# Patient Record
Sex: Female | Born: 1950 | Race: Black or African American | Hispanic: No | Marital: Single | Smoking: Current every day smoker
Health system: Southern US, Community
[De-identification: ages and names within clinical notes are randomized; demographics above are authoritative.]

## PROBLEM LIST (undated history)

## (undated) DIAGNOSIS — E119 Type 2 diabetes mellitus without complications: Secondary | ICD-10-CM

## (undated) DIAGNOSIS — R928 Other abnormal and inconclusive findings on diagnostic imaging of breast: Secondary | ICD-10-CM

## (undated) DIAGNOSIS — F32A Depression, unspecified: Secondary | ICD-10-CM

## (undated) DIAGNOSIS — M5020 Other cervical disc displacement, unspecified cervical region: Secondary | ICD-10-CM

## (undated) DIAGNOSIS — N611 Abscess of the breast and nipple: Secondary | ICD-10-CM

## (undated) DIAGNOSIS — R42 Dizziness and giddiness: Secondary | ICD-10-CM

## (undated) DIAGNOSIS — M48061 Spinal stenosis, lumbar region without neurogenic claudication: Secondary | ICD-10-CM

## (undated) DIAGNOSIS — F819 Developmental disorder of scholastic skills, unspecified: Secondary | ICD-10-CM

## (undated) DIAGNOSIS — F419 Anxiety disorder, unspecified: Secondary | ICD-10-CM

## (undated) DIAGNOSIS — F329 Major depressive disorder, single episode, unspecified: Secondary | ICD-10-CM

## (undated) DIAGNOSIS — M4800 Spinal stenosis, site unspecified: Secondary | ICD-10-CM

## (undated) HISTORY — DX: Dizziness and giddiness: R42

## (undated) HISTORY — PX: BREAST CYST ASPIRATION: SHX578

## (undated) HISTORY — DX: Other abnormal and inconclusive findings on diagnostic imaging of breast: R92.8

## (undated) HISTORY — DX: Abscess of the breast and nipple: N61.1

## (undated) HISTORY — DX: Spinal stenosis, lumbar region without neurogenic claudication: M48.061

## (undated) HISTORY — DX: Major depressive disorder, single episode, unspecified: F32.9

## (undated) HISTORY — DX: Developmental disorder of scholastic skills, unspecified: F81.9

## (undated) HISTORY — DX: Spinal stenosis, site unspecified: M48.00

## (undated) HISTORY — PX: MYOMECTOMY: SHX85

## (undated) HISTORY — DX: Depression, unspecified: F32.A

## (undated) HISTORY — PX: ABDOMINAL HYSTERECTOMY: SHX81

## (undated) HISTORY — DX: Anxiety disorder, unspecified: F41.9

## (undated) HISTORY — DX: Other cervical disc displacement, unspecified cervical region: M50.20

---

## 1978-12-24 DIAGNOSIS — N611 Abscess of the breast and nipple: Secondary | ICD-10-CM

## 1978-12-24 HISTORY — DX: Abscess of the breast and nipple: N61.1

## 1997-09-28 ENCOUNTER — Encounter: Admission: RE | Admit: 1997-09-28 | Discharge: 1997-09-28 | Payer: Self-pay | Admitting: Sports Medicine

## 1997-09-30 ENCOUNTER — Other Ambulatory Visit: Admission: RE | Admit: 1997-09-30 | Discharge: 1997-09-30 | Payer: Self-pay | Admitting: Obstetrics & Gynecology

## 1997-10-16 ENCOUNTER — Emergency Department (HOSPITAL_COMMUNITY): Admission: EM | Admit: 1997-10-16 | Discharge: 1997-10-16 | Payer: Self-pay | Admitting: Emergency Medicine

## 1998-09-21 ENCOUNTER — Emergency Department (HOSPITAL_COMMUNITY): Admission: EM | Admit: 1998-09-21 | Discharge: 1998-09-21 | Payer: Self-pay | Admitting: Emergency Medicine

## 1998-12-24 ENCOUNTER — Encounter (INDEPENDENT_AMBULATORY_CARE_PROVIDER_SITE_OTHER): Payer: Self-pay | Admitting: *Deleted

## 1998-12-24 LAB — CONVERTED CEMR LAB

## 1998-12-26 ENCOUNTER — Encounter: Admission: RE | Admit: 1998-12-26 | Discharge: 1998-12-26 | Payer: Self-pay | Admitting: Family Medicine

## 1999-01-06 ENCOUNTER — Encounter (INDEPENDENT_AMBULATORY_CARE_PROVIDER_SITE_OTHER): Payer: Self-pay

## 1999-01-06 ENCOUNTER — Other Ambulatory Visit: Admission: RE | Admit: 1999-01-06 | Discharge: 1999-01-06 | Payer: Self-pay | Admitting: Obstetrics and Gynecology

## 1999-02-24 ENCOUNTER — Inpatient Hospital Stay (HOSPITAL_COMMUNITY): Admission: RE | Admit: 1999-02-24 | Discharge: 1999-02-26 | Payer: Self-pay | Admitting: Obstetrics and Gynecology

## 1999-02-24 ENCOUNTER — Encounter (INDEPENDENT_AMBULATORY_CARE_PROVIDER_SITE_OTHER): Payer: Self-pay | Admitting: Specialist

## 1999-05-26 DIAGNOSIS — R928 Other abnormal and inconclusive findings on diagnostic imaging of breast: Secondary | ICD-10-CM

## 1999-05-26 HISTORY — DX: Other abnormal and inconclusive findings on diagnostic imaging of breast: R92.8

## 1999-06-20 ENCOUNTER — Encounter: Payer: Self-pay | Admitting: Family Medicine

## 1999-06-20 ENCOUNTER — Encounter: Admission: RE | Admit: 1999-06-20 | Discharge: 1999-06-20 | Payer: Self-pay | Admitting: Family Medicine

## 1999-06-23 ENCOUNTER — Encounter: Admission: RE | Admit: 1999-06-23 | Discharge: 1999-06-23 | Payer: Self-pay | Admitting: Family Medicine

## 1999-06-23 ENCOUNTER — Encounter: Payer: Self-pay | Admitting: Family Medicine

## 1999-07-04 ENCOUNTER — Encounter: Admission: RE | Admit: 1999-07-04 | Discharge: 1999-07-04 | Payer: Self-pay | Admitting: Family Medicine

## 1999-10-01 ENCOUNTER — Emergency Department (HOSPITAL_COMMUNITY): Admission: EM | Admit: 1999-10-01 | Discharge: 1999-10-02 | Payer: Self-pay | Admitting: Emergency Medicine

## 1999-10-02 ENCOUNTER — Emergency Department (HOSPITAL_COMMUNITY): Admission: EM | Admit: 1999-10-02 | Discharge: 1999-10-02 | Payer: Self-pay | Admitting: Emergency Medicine

## 2000-07-05 ENCOUNTER — Emergency Department (HOSPITAL_COMMUNITY): Admission: EM | Admit: 2000-07-05 | Discharge: 2000-07-05 | Payer: Self-pay | Admitting: Emergency Medicine

## 2000-07-08 ENCOUNTER — Emergency Department (HOSPITAL_COMMUNITY): Admission: EM | Admit: 2000-07-08 | Discharge: 2000-07-08 | Payer: Self-pay | Admitting: Emergency Medicine

## 2000-07-11 ENCOUNTER — Encounter: Admission: RE | Admit: 2000-07-11 | Discharge: 2000-07-11 | Payer: Self-pay | Admitting: Family Medicine

## 2000-07-13 ENCOUNTER — Emergency Department (HOSPITAL_COMMUNITY): Admission: EM | Admit: 2000-07-13 | Discharge: 2000-07-13 | Payer: Self-pay | Admitting: Emergency Medicine

## 2000-08-16 ENCOUNTER — Encounter: Admission: RE | Admit: 2000-08-16 | Discharge: 2000-08-16 | Payer: Self-pay | Admitting: Family Medicine

## 2000-08-16 ENCOUNTER — Encounter: Payer: Self-pay | Admitting: Family Medicine

## 2000-08-22 ENCOUNTER — Encounter: Admission: RE | Admit: 2000-08-22 | Discharge: 2000-08-22 | Payer: Self-pay | Admitting: Family Medicine

## 2000-08-22 ENCOUNTER — Encounter: Payer: Self-pay | Admitting: Family Medicine

## 2000-10-15 ENCOUNTER — Encounter: Admission: RE | Admit: 2000-10-15 | Discharge: 2000-10-15 | Payer: Self-pay | Admitting: Family Medicine

## 2001-01-07 ENCOUNTER — Ambulatory Visit (HOSPITAL_COMMUNITY): Admission: RE | Admit: 2001-01-07 | Discharge: 2001-01-07 | Payer: Self-pay | Admitting: Family Medicine

## 2001-01-07 ENCOUNTER — Encounter: Admission: RE | Admit: 2001-01-07 | Discharge: 2001-01-07 | Payer: Self-pay | Admitting: Family Medicine

## 2001-01-11 ENCOUNTER — Emergency Department (HOSPITAL_COMMUNITY): Admission: EM | Admit: 2001-01-11 | Discharge: 2001-01-11 | Payer: Self-pay | Admitting: Emergency Medicine

## 2001-01-23 ENCOUNTER — Ambulatory Visit (HOSPITAL_COMMUNITY): Admission: RE | Admit: 2001-01-23 | Discharge: 2001-01-23 | Payer: Self-pay | Admitting: Family Medicine

## 2001-04-03 ENCOUNTER — Encounter: Admission: RE | Admit: 2001-04-03 | Discharge: 2001-04-03 | Payer: Self-pay | Admitting: Family Medicine

## 2001-07-25 ENCOUNTER — Encounter: Admission: RE | Admit: 2001-07-25 | Discharge: 2001-07-25 | Payer: Self-pay | Admitting: Family Medicine

## 2001-07-29 ENCOUNTER — Encounter: Admission: RE | Admit: 2001-07-29 | Discharge: 2001-07-29 | Payer: Self-pay | Admitting: Family Medicine

## 2001-08-18 ENCOUNTER — Encounter: Admission: RE | Admit: 2001-08-18 | Discharge: 2001-08-18 | Payer: Self-pay | Admitting: Family Medicine

## 2001-08-18 ENCOUNTER — Encounter: Payer: Self-pay | Admitting: Family Medicine

## 2001-10-06 ENCOUNTER — Emergency Department (HOSPITAL_COMMUNITY): Admission: EM | Admit: 2001-10-06 | Discharge: 2001-10-06 | Payer: Self-pay

## 2002-08-19 ENCOUNTER — Encounter: Payer: Self-pay | Admitting: Family Medicine

## 2002-08-19 ENCOUNTER — Encounter: Admission: RE | Admit: 2002-08-19 | Discharge: 2002-08-19 | Payer: Self-pay | Admitting: Family Medicine

## 2002-11-17 ENCOUNTER — Encounter: Payer: Self-pay | Admitting: Sports Medicine

## 2002-11-17 ENCOUNTER — Encounter: Admission: RE | Admit: 2002-11-17 | Discharge: 2002-11-17 | Payer: Self-pay | Admitting: Sports Medicine

## 2002-11-17 ENCOUNTER — Encounter: Admission: RE | Admit: 2002-11-17 | Discharge: 2002-11-17 | Payer: Self-pay | Admitting: Family Medicine

## 2003-03-02 ENCOUNTER — Encounter: Admission: RE | Admit: 2003-03-02 | Discharge: 2003-03-02 | Payer: Self-pay | Admitting: Sports Medicine

## 2003-03-02 ENCOUNTER — Encounter: Admission: RE | Admit: 2003-03-02 | Discharge: 2003-03-02 | Payer: Self-pay | Admitting: Family Medicine

## 2003-03-02 ENCOUNTER — Encounter: Payer: Self-pay | Admitting: Sports Medicine

## 2003-03-23 ENCOUNTER — Encounter: Admission: RE | Admit: 2003-03-23 | Discharge: 2003-03-23 | Payer: Self-pay | Admitting: Family Medicine

## 2003-04-27 ENCOUNTER — Encounter: Admission: RE | Admit: 2003-04-27 | Discharge: 2003-04-27 | Payer: Self-pay | Admitting: Family Medicine

## 2003-05-24 ENCOUNTER — Emergency Department (HOSPITAL_COMMUNITY): Admission: AD | Admit: 2003-05-24 | Discharge: 2003-05-24 | Payer: Self-pay | Admitting: Family Medicine

## 2003-08-23 ENCOUNTER — Encounter: Admission: RE | Admit: 2003-08-23 | Discharge: 2003-08-23 | Payer: Self-pay | Admitting: Family Medicine

## 2004-08-28 ENCOUNTER — Encounter: Admission: RE | Admit: 2004-08-28 | Discharge: 2004-08-28 | Payer: Self-pay | Admitting: Sports Medicine

## 2004-10-17 ENCOUNTER — Ambulatory Visit: Payer: Self-pay | Admitting: Family Medicine

## 2005-04-26 ENCOUNTER — Ambulatory Visit: Payer: Self-pay | Admitting: Family Medicine

## 2005-07-07 ENCOUNTER — Encounter: Admission: RE | Admit: 2005-07-07 | Discharge: 2005-07-07 | Payer: Self-pay | Admitting: Neurosurgery

## 2005-09-25 ENCOUNTER — Encounter: Admission: RE | Admit: 2005-09-25 | Discharge: 2005-09-25 | Payer: Self-pay | Admitting: Neurosurgery

## 2005-09-29 ENCOUNTER — Ambulatory Visit (HOSPITAL_COMMUNITY): Admission: RE | Admit: 2005-09-29 | Discharge: 2005-09-29 | Payer: Self-pay | Admitting: Emergency Medicine

## 2005-10-11 ENCOUNTER — Encounter: Admission: RE | Admit: 2005-10-11 | Discharge: 2005-10-11 | Payer: Self-pay | Admitting: Hematology & Oncology

## 2005-11-15 ENCOUNTER — Ambulatory Visit: Payer: Self-pay | Admitting: Family Medicine

## 2005-12-28 ENCOUNTER — Ambulatory Visit: Payer: Self-pay | Admitting: Family Medicine

## 2005-12-28 ENCOUNTER — Encounter: Admission: RE | Admit: 2005-12-28 | Discharge: 2005-12-28 | Payer: Self-pay | Admitting: Family Medicine

## 2006-01-01 ENCOUNTER — Encounter: Admission: RE | Admit: 2006-01-01 | Discharge: 2006-01-01 | Payer: Self-pay | Admitting: Sports Medicine

## 2006-03-29 ENCOUNTER — Encounter (INDEPENDENT_AMBULATORY_CARE_PROVIDER_SITE_OTHER): Payer: Self-pay | Admitting: Specialist

## 2006-03-29 ENCOUNTER — Ambulatory Visit (HOSPITAL_COMMUNITY): Admission: RE | Admit: 2006-03-29 | Discharge: 2006-03-29 | Payer: Self-pay | Admitting: Surgery

## 2006-07-23 ENCOUNTER — Ambulatory Visit: Payer: Self-pay | Admitting: Family Medicine

## 2006-08-22 DIAGNOSIS — M545 Low back pain, unspecified: Secondary | ICD-10-CM

## 2006-08-22 DIAGNOSIS — E669 Obesity, unspecified: Secondary | ICD-10-CM

## 2006-08-22 DIAGNOSIS — F17200 Nicotine dependence, unspecified, uncomplicated: Secondary | ICD-10-CM

## 2006-08-22 HISTORY — DX: Low back pain, unspecified: M54.50

## 2006-08-23 ENCOUNTER — Encounter (INDEPENDENT_AMBULATORY_CARE_PROVIDER_SITE_OTHER): Payer: Self-pay | Admitting: *Deleted

## 2006-08-26 ENCOUNTER — Encounter (INDEPENDENT_AMBULATORY_CARE_PROVIDER_SITE_OTHER): Payer: Self-pay | Admitting: Specialist

## 2006-08-26 ENCOUNTER — Ambulatory Visit (HOSPITAL_COMMUNITY): Admission: RE | Admit: 2006-08-26 | Discharge: 2006-08-26 | Payer: Self-pay | Admitting: Unknown Physician Specialty

## 2006-10-11 ENCOUNTER — Ambulatory Visit: Payer: Self-pay | Admitting: Family Medicine

## 2007-01-13 ENCOUNTER — Ambulatory Visit: Payer: Self-pay | Admitting: Family Medicine

## 2007-01-13 ENCOUNTER — Telehealth: Payer: Self-pay | Admitting: *Deleted

## 2007-01-13 DIAGNOSIS — G4700 Insomnia, unspecified: Secondary | ICD-10-CM

## 2007-01-13 DIAGNOSIS — M542 Cervicalgia: Secondary | ICD-10-CM | POA: Insufficient documentation

## 2007-01-13 HISTORY — DX: Insomnia, unspecified: G47.00

## 2007-01-14 ENCOUNTER — Encounter: Admission: RE | Admit: 2007-01-14 | Discharge: 2007-01-14 | Payer: Self-pay | Admitting: Family Medicine

## 2007-01-23 ENCOUNTER — Encounter: Admission: RE | Admit: 2007-01-23 | Discharge: 2007-01-23 | Payer: Self-pay | Admitting: Sports Medicine

## 2007-01-27 ENCOUNTER — Encounter: Payer: Self-pay | Admitting: Family Medicine

## 2007-01-30 LAB — HM COLONOSCOPY

## 2007-04-04 ENCOUNTER — Encounter: Payer: Self-pay | Admitting: Family Medicine

## 2007-04-04 ENCOUNTER — Ambulatory Visit: Payer: Self-pay | Admitting: Family Medicine

## 2007-04-04 LAB — CONVERTED CEMR LAB
Hemoglobin: 14.5 g/dL (ref 12.0–15.0)
RBC: 4.57 M/uL (ref 3.87–5.11)
TSH: 0.791 microintl units/mL (ref 0.350–5.50)
WBC: 9.2 10*3/uL (ref 4.0–10.5)

## 2007-04-08 ENCOUNTER — Telehealth: Payer: Self-pay | Admitting: *Deleted

## 2008-01-26 ENCOUNTER — Encounter: Payer: Self-pay | Admitting: Family Medicine

## 2008-01-26 ENCOUNTER — Encounter: Admission: RE | Admit: 2008-01-26 | Discharge: 2008-01-26 | Payer: Self-pay | Admitting: General Surgery

## 2008-02-02 ENCOUNTER — Encounter: Payer: Self-pay | Admitting: *Deleted

## 2008-02-02 ENCOUNTER — Ambulatory Visit: Payer: Self-pay | Admitting: Family Medicine

## 2008-02-02 ENCOUNTER — Encounter: Payer: Self-pay | Admitting: Family Medicine

## 2008-02-02 LAB — CONVERTED CEMR LAB
HDL: 37 mg/dL
LDL Cholesterol: 127 mg/dL
LDL Cholesterol: 127 mg/dL — ABNORMAL HIGH (ref 0–99)
Total CHOL/HDL Ratio: 5.6
VLDL: 45 mg/dL — ABNORMAL HIGH (ref 0–40)

## 2008-02-03 ENCOUNTER — Encounter: Payer: Self-pay | Admitting: Family Medicine

## 2008-02-16 ENCOUNTER — Encounter: Payer: Self-pay | Admitting: Family Medicine

## 2008-02-22 ENCOUNTER — Encounter: Admission: RE | Admit: 2008-02-22 | Discharge: 2008-02-22 | Payer: Self-pay | Admitting: Neurosurgery

## 2008-02-24 ENCOUNTER — Encounter: Payer: Self-pay | Admitting: Family Medicine

## 2008-04-08 ENCOUNTER — Emergency Department (HOSPITAL_COMMUNITY): Admission: EM | Admit: 2008-04-08 | Discharge: 2008-04-08 | Payer: Self-pay | Admitting: Emergency Medicine

## 2008-06-22 ENCOUNTER — Ambulatory Visit: Payer: Self-pay | Admitting: Family Medicine

## 2008-06-22 DIAGNOSIS — M48061 Spinal stenosis, lumbar region without neurogenic claudication: Secondary | ICD-10-CM

## 2008-06-22 HISTORY — DX: Spinal stenosis, lumbar region without neurogenic claudication: M48.061

## 2008-07-02 ENCOUNTER — Encounter: Admission: RE | Admit: 2008-07-02 | Discharge: 2008-09-30 | Payer: Self-pay | Admitting: Family Medicine

## 2008-07-02 ENCOUNTER — Encounter: Payer: Self-pay | Admitting: Family Medicine

## 2008-08-12 ENCOUNTER — Encounter: Payer: Self-pay | Admitting: Family Medicine

## 2008-10-01 ENCOUNTER — Ambulatory Visit: Payer: Self-pay | Admitting: Family Medicine

## 2008-10-04 ENCOUNTER — Encounter: Payer: Self-pay | Admitting: Family Medicine

## 2008-10-11 ENCOUNTER — Telehealth (INDEPENDENT_AMBULATORY_CARE_PROVIDER_SITE_OTHER): Payer: Self-pay | Admitting: *Deleted

## 2008-11-29 ENCOUNTER — Ambulatory Visit: Payer: Self-pay | Admitting: Family Medicine

## 2008-11-29 ENCOUNTER — Encounter: Payer: Self-pay | Admitting: Family Medicine

## 2008-12-01 ENCOUNTER — Encounter: Payer: Self-pay | Admitting: Family Medicine

## 2008-12-06 ENCOUNTER — Encounter: Payer: Self-pay | Admitting: Family Medicine

## 2008-12-13 ENCOUNTER — Ambulatory Visit: Payer: Self-pay | Admitting: Family Medicine

## 2008-12-13 DIAGNOSIS — E785 Hyperlipidemia, unspecified: Secondary | ICD-10-CM

## 2009-01-04 ENCOUNTER — Emergency Department (HOSPITAL_COMMUNITY): Admission: EM | Admit: 2009-01-04 | Discharge: 2009-01-04 | Payer: Self-pay | Admitting: Family Medicine

## 2009-01-17 ENCOUNTER — Emergency Department (HOSPITAL_COMMUNITY): Admission: EM | Admit: 2009-01-17 | Discharge: 2009-01-17 | Payer: Self-pay | Admitting: Emergency Medicine

## 2009-01-19 ENCOUNTER — Emergency Department (HOSPITAL_COMMUNITY): Admission: EM | Admit: 2009-01-19 | Discharge: 2009-01-19 | Payer: Self-pay | Admitting: Emergency Medicine

## 2009-02-01 ENCOUNTER — Ambulatory Visit: Payer: Self-pay | Admitting: Family Medicine

## 2009-02-23 ENCOUNTER — Encounter: Admission: RE | Admit: 2009-02-23 | Discharge: 2009-02-23 | Payer: Self-pay | Admitting: Family Medicine

## 2009-03-26 ENCOUNTER — Emergency Department (HOSPITAL_COMMUNITY): Admission: EM | Admit: 2009-03-26 | Discharge: 2009-03-26 | Payer: Self-pay | Admitting: Family Medicine

## 2009-04-27 ENCOUNTER — Encounter: Payer: Self-pay | Admitting: Family Medicine

## 2009-04-27 DIAGNOSIS — F418 Other specified anxiety disorders: Secondary | ICD-10-CM

## 2009-05-04 ENCOUNTER — Inpatient Hospital Stay (HOSPITAL_COMMUNITY): Admission: AD | Admit: 2009-05-04 | Discharge: 2009-05-05 | Payer: Self-pay | Admitting: Family Medicine

## 2009-05-04 ENCOUNTER — Ambulatory Visit: Payer: Self-pay | Admitting: Family Medicine

## 2009-05-04 ENCOUNTER — Encounter: Payer: Self-pay | Admitting: Family Medicine

## 2009-05-10 ENCOUNTER — Ambulatory Visit: Payer: Self-pay | Admitting: Family Medicine

## 2009-07-04 ENCOUNTER — Ambulatory Visit: Payer: Self-pay | Admitting: Family Medicine

## 2009-07-04 ENCOUNTER — Encounter: Payer: Self-pay | Admitting: Family Medicine

## 2009-07-05 ENCOUNTER — Encounter: Payer: Self-pay | Admitting: Family Medicine

## 2009-07-05 LAB — CONVERTED CEMR LAB
Alkaline Phosphatase: 51 units/L (ref 39–117)
Cholesterol: 214 mg/dL — ABNORMAL HIGH (ref 0–200)
Glucose, Bld: 136 mg/dL — ABNORMAL HIGH (ref 70–99)
HDL: 44 mg/dL (ref >39)
Potassium: 4.4 meq/L (ref 3.5–5.3)
Total Bilirubin: 1 mg/dL (ref 0.3–1.2)
Total CHOL/HDL Ratio: 4.9
Total Protein: 7.4 g/dL (ref 6.0–8.3)
Triglycerides: 152 mg/dL — ABNORMAL HIGH (ref <150)
VLDL: 30 mg/dL (ref 0–40)

## 2009-08-16 ENCOUNTER — Ambulatory Visit: Payer: Self-pay | Admitting: Family Medicine

## 2009-11-11 ENCOUNTER — Ambulatory Visit: Payer: Self-pay | Admitting: Family Medicine

## 2009-11-11 ENCOUNTER — Encounter: Payer: Self-pay | Admitting: Family Medicine

## 2009-11-11 LAB — CONVERTED CEMR LAB
BUN: 12 mg/dL (ref 6–23)
Chloride: 104 meq/L (ref 96–112)
Direct LDL: 153 mg/dL — ABNORMAL HIGH
Glucose, Bld: 106 mg/dL — ABNORMAL HIGH (ref 70–99)
Potassium: 4 meq/L (ref 3.5–5.3)
Sodium: 139 meq/L (ref 135–145)

## 2009-11-14 ENCOUNTER — Encounter: Payer: Self-pay | Admitting: Family Medicine

## 2009-12-13 ENCOUNTER — Telehealth: Payer: Self-pay | Admitting: Family Medicine

## 2009-12-19 ENCOUNTER — Ambulatory Visit: Payer: Self-pay | Admitting: Family Medicine

## 2010-01-04 ENCOUNTER — Telehealth: Payer: Self-pay | Admitting: Family Medicine

## 2010-01-13 ENCOUNTER — Ambulatory Visit: Payer: Self-pay | Admitting: Family Medicine

## 2010-01-13 ENCOUNTER — Encounter: Payer: Self-pay | Admitting: Family Medicine

## 2010-01-13 LAB — CONVERTED CEMR LAB
ALT: 17 units/L (ref 0–35)
AST: 20 units/L (ref 0–37)
Albumin: 4.2 g/dL (ref 3.5–5.2)
CO2: 24 meq/L (ref 19–32)
Calcium: 9.2 mg/dL (ref 8.4–10.5)
Chloride: 105 meq/L (ref 96–112)
Cholesterol: 160 mg/dL (ref 0–200)
Potassium: 3.8 meq/L (ref 3.5–5.3)

## 2010-01-17 ENCOUNTER — Encounter: Payer: Self-pay | Admitting: Family Medicine

## 2010-03-25 ENCOUNTER — Emergency Department (HOSPITAL_COMMUNITY): Admission: EM | Admit: 2010-03-25 | Discharge: 2010-03-25 | Payer: Self-pay | Admitting: Family Medicine

## 2010-04-04 ENCOUNTER — Ambulatory Visit: Payer: Self-pay | Admitting: Family Medicine

## 2010-04-12 ENCOUNTER — Ambulatory Visit (HOSPITAL_COMMUNITY): Admission: RE | Admit: 2010-04-12 | Discharge: 2010-04-12 | Payer: Self-pay | Admitting: Family Medicine

## 2010-04-12 LAB — HM MAMMOGRAPHY

## 2010-04-25 ENCOUNTER — Encounter: Payer: Self-pay | Admitting: Family Medicine

## 2010-07-03 ENCOUNTER — Ambulatory Visit: Admission: RE | Admit: 2010-07-03 | Discharge: 2010-07-03 | Payer: Self-pay | Source: Home / Self Care

## 2010-07-25 NOTE — Letter (Signed)
Summary: Generic Letter  [facility]  [address]   [place], [place] [address]   Phone: [phone]  Fax: [phone]    01/17/2010  Jenna Wong [address] [place], [place]  [address]  Dear Jenna Wong,   Your blood work still showed that your blood sugar is running high.  We will need to recheck in October, if it is still high we may start something to help bring it down.  I have not called this diabetes but you are definitely pre-diabetic.  Your cholesterol is much improved on the medication.  Please continue it as directed.  See you in October, you may want to come to the end so we have flu shots in.        Sincerely,   Luretha Murphy NP  Appended Document: Generic Letter mailed

## 2010-07-25 NOTE — Assessment & Plan Note (Signed)
Summary: f/u,df   Vital Signs:  Patient profile:   60 year old female Height:      66 inches Weight:      222.8 pounds Pulse rate:   88 / minute BP sitting:   143 / 82  (left arm)  Vitals Entered By: Luretha Murphy NP (Nov 11, 2009 2:10 PM) CC: f/up labs. refill meds. Pain Assessment Patient in pain? yes     Location: lower back Intensity: 8 Onset of pain  Chronic   Primary Care Provider:  Luretha Murphy NP  CC:  f/up labs. refill meds..  History of Present Illness: Follow up apt for development of metabolic syndrome from Seroquel, mental health took her off the Seroquel.  She reports that her depression is improved.  She is trying to lose weight but has severe back pain when she exercises, purchased a stationary bike, walks.  Trying to eat healthy, does not seem to be able to lose weight.  Still smoking several a day but much less that in the past.  Habits & Providers  Alcohol-Tobacco-Diet     Tobacco Status: current     Tobacco Counseling: to quit use of tobacco products  Current Medications (verified): 1)  Flexeril 10 Mg  Tabs (Cyclobenzaprine Hcl) .... Three Times A Day Prn 2)  Tens Unti .... Use Per Pt 3)  Hydrocodone-Acetaminophen 5-500 Mg Tabs (Hydrocodone-Acetaminophen) .... Three Times A Day As Needed 4)  Sertraline Hcl 50 Mg Tabs (Sertraline Hcl) .... One 5)  Topamax 50 Mg Tabs (Topiramate) .... One At Bedtime (Check With Patient Regarding Price)  Allergies (verified): No Known Drug Allergies  Social History: Smoking Status:  current  Review of Systems General:  Denies malaise and weakness. CV:  Denies chest pain or discomfort. Resp:  Denies cough and wheezing. GI:  Complains of constipation. MS:  Complains of low back pain and stiffness. Psych:  Complains of depression; denies panic attacks, suicidal thoughts/plans, and unusual visions or sounds.   Impression & Recommendations:  Problem # 1:  DEPRESSION, MAJOR (ICD-296.20) Now off seroquel, resume  sertraline.   Orders: [facility]- Est Level  3 ([address]21)  Problem # 2:  HYPERLIPIDEMIA (ICD-272.4) recheck LDL off seroquel Orders: Direct LDL-[facility] ([address]-78469) [facility]- Est Level  3 (629[address])  Problem # 3:  SCREENING, DIABETES MELLITUS (ICD-V77.1) recheck glucose off Seroquel Orders: Basic Met-[facility] (0011001100) [facility]- Est Level  3 ([address])  Problem # 4:  PLANTAR FASCIITIS, BILATERAL (ICD-728.71) improved with exercises  Problem # 5:  SPINAL STENOSIS, LUMBAR (ICD-724.02) Is now receiving disability, will not get Medicare until 2 years after approval.  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  She was evaluated by neurosurgery and was unable to afford the up front payment.  She underwent extensive PT and uses a TENS.  Refilled hydrocodone, has never abused this. Orders: [facility]- Est Level  3 ([address])  Problem # 6:  OBESITY, NOS (ICD-278.00) Counseled on weight loss  Complete Medication List: 1)  Flexeril 10 Mg Tabs (Cyclobenzaprine hcl) .... Three times a day prn 2)  Tens Unti  .... Use per pt 3)  Hydrocodone-acetaminophen 5-500 Mg Tabs (Hydrocodone-acetaminophen) .... Three times a day as needed 4)  Sertraline Hcl 50 Mg Tabs (Sertraline hcl) .... One 5)  Topamax 50 Mg Tabs (Topiramate) .... One at bedtime (check with patient regarding price)  Patient Instructions: 1)  Limit carbs  2)  Limit sugar 3)  Exercise 4)  use pain meds after exercise 5)  I will call you with lab work 6)  Please schedule a follow-up appointment in 2 months .  Prescriptions: HYDROCODONE-ACETAMINOPHEN 5-500 MG TABS (HYDROCODONE-ACETAMINOPHEN) three times a day as needed Brand medically necessary #30 x 0   Entered and Authorized by:   Luretha Murphy NP   Signed by:   Luretha Murphy NP on 11/11/2009   Method used:   Print then Give to Patient   RxID:   [id number] TOPAMAX 50 MG TABS (TOPIRAMATE) one at bedtime (check with patient regarding price) Brand  medically necessary #30 x 6   Entered and Authorized by:   Luretha Murphy NP   Signed by:   Luretha Murphy NP on 11/11/2009   Method used:   Print then Give to Patient   RxID:   [id number] SERTRALINE HCL 50 MG TABS (SERTRALINE HCL) one Brand medically necessary #30 x 11   Entered and Authorized by:   Luretha Murphy NP   Signed by:   Luretha Murphy NP on 11/11/2009   Method used:   Print then Give to Patient   RxID:   [id number] FLEXERIL 10 MG  TABS (CYCLOBENZAPRINE HCL) three times a day prn Brand medically necessary #30 x 6   Entered and Authorized by:   Luretha Murphy NP   Signed by:   Luretha Murphy NP on 11/11/2009   Method used:   Print then Give to Patient   RxID:   [id number]    Prevention & Chronic Care Immunizations   Influenza vaccine: Fluvax Non-MCR  (07/04/2009)    Tetanus booster: 02/24/2003: Done.   Tetanus booster due: 02/23/2013    Pneumococcal vaccine: Not documented  Colorectal Screening   Hemoccult: Not documented   Hemoccult due: Not Indicated    Colonoscopy: Adenomatous Polyp  (01/30/2007)   Colonoscopy due: 01/2012  Other Screening   Pap smear: Done.  (12/24/1998)   Pap smear due: Not Indicated    Mammogram: ASSESSMENT: Negative - BI-RADS 1^MM DIGITAL SCREENING  (02/23/2009)   Mammogram due: 01/25/2009   Smoking status: current  (11/11/2009)   Smoking cessation counseling: yes  (02/02/2008)  Lipids   Total Cholesterol: 214  (07/04/2009)   Lipid panel action/deferral: Lipid Panel ordered   LDL: 140  (07/04/2009)   LDL Direct: Not documented   HDL: 44  (07/04/2009)   Triglycerides: 152  (07/04/2009)    SGOT (AST): 23  (07/04/2009)   SGPT (ALT): 25  (07/04/2009)   Alkaline phosphatase: 51  (07/04/2009)   Total bilirubin: 1.0  (07/04/2009)  Self-Management Support :    Lipid self-management support: Not documented

## 2010-07-25 NOTE — Letter (Signed)
Summary: Generic Letter  [facility]  [address]   [place], [place] [address]   Phone: [phone]  Fax: [phone]    07/05/2009  INETA SINNING [address] [place], [place]  [address]  Dear Ms. DROZDOWSKI,   Included are the results of your blood work.  As we discussed on the phone, both your blood sugar and cholesterol are high.   Please take these labs to mental health with you to discuss if you are going to continue your Seroquel.  In the meantime, try to walk daily and to reduce your intake of sugar, animal fat, and starchs.  I will plan to see you in April unless you need me before that time.    Sincerely,   Luretha Murphy NP

## 2010-07-25 NOTE — Assessment & Plan Note (Signed)
Summary: f/u,df   Vital Signs:  Patient profile:   60 year old female Weight:      219.9 pounds Pulse rate:   80 / minute Pulse (ortho):   71 / minute BP sitting:   124 / 72  (left arm) BP standing:   106 / 66 Cuff size:   large  Vitals Entered By: [name] CMA, (January 13, 2010 9:17 AM)  Serial Vital Signs/Assessments:  Time      Position  BP       Pulse  Resp  Temp     By 10:16 AM  Lying LA  110/70   72                    Thekla Slade CMA, 10:16 AM  Sitting   108/68   71                    Thekla Slade CMA, 10:16 AM  Standing  106/66   71                    Thekla Slade CMA,  CC: f/up last OV .. labs and meds. Is Patient Diabetic? No Pain Assessment Patient in pain? no        Primary Care Provider:  Luretha Murphy NP  CC:  f/up last OV .. labs and meds..  History of Present Illness: Follow up apt for hyperlipidemia and initiation of statin one month ago.  Has not been able to get into the dental clinic since they are currently not taking patients.  She remembers the day when she had good benefits and could go to the dentist.    Depression is improved on Zoloft.  Trying to walk and eat healthy.  Habits & Providers  Alcohol-Tobacco-Diet     Tobacco Status: current     Tobacco Counseling: to quit use of tobacco products  Current Medications (verified): 1)  Flexeril 10 Mg  Tabs (Cyclobenzaprine Hcl) .... Three Times A Day Prn 2)  Tens Unti .... Use Per Pt 3)  Hydrocodone-Acetaminophen 5-500 Mg Tabs (Hydrocodone-Acetaminophen) .... Three Times A Day As Needed 4)  Sertraline Hcl 50 Mg Tabs (Sertraline Hcl) .... One 5)  Pravastatin Sodium 40 Mg Tabs (Pravastatin Sodium) .... 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 MS:  Complains of low back pain. Psych:  Denies anxiety and depression.  Physical Exam  General:  Looked well Mouth:  poor dentition.   Lungs:  normal respiratory effort and normal breath sounds.   Heart:  normal rate and regular rhythm.   Psych:  dysphoric affect.     Impression & Recommendations:  Problem # 1:  DENTAL PAIN (ICD-525.9)  Still waiting for dental clinic to resume taking patients  Orders: [facility]- Est Level  3 ([address])  Problem # 2:  DEPRESSION, MAJOR (ICD-296.20)  improved on SSRI  Orders: [facility]- Est Level  3 ([address])  Problem # 3:  HYPERLIPIDEMIA (ICD-272.4) Sarted statin check labs today Her updated medication list for this problem includes:    Pravastatin Sodium 40 Mg Tabs (Pravastatin sodium) ..... One qhs  Orders: Lipid-[facility] ([id number]) Comp Met-[facility] [phone]) [facility]- Est Level  3 ([address])  Problem # 4:  SPINAL STENOSIS, LUMBAR (ICD-724.02) reason for long term disability  Complete Medication List: 1)  Flexeril 10 Mg Tabs (Cyclobenzaprine hcl) .... Three times a day prn 2)  Tens Unti  .... Use per pt 3)  Hydrocodone-acetaminophen 5-500 Mg Tabs (Hydrocodone-acetaminophen) .... Three times a day as needed 4)  Sertraline Hcl 50 Mg Tabs (Sertraline hcl) .... One 5)  Pravastatin Sodium 40 Mg Tabs (Pravastatin sodium) .... One qhs  Patient Instructions: 1)  End of October for flu shot   Prevention & Chronic Care Immunizations   Influenza vaccine: Fluvax Non-MCR  (07/04/2009)    Tetanus booster: 02/24/2003: Done.   Tetanus booster due: 02/23/2013    Pneumococcal vaccine: Not documented  Colorectal Screening   Hemoccult: Not documented   Hemoccult due: Not Indicated    Colonoscopy: Adenomatous Polyp  (01/30/2007)   Colonoscopy due: 01/2012  Other Screening   Pap smear: Done.  (12/24/1998)   Pap smear due: Not Indicated    Mammogram: ASSESSMENT: Negative - BI-RADS 1^MM  DIGITAL SCREENING  (02/23/2009)   Mammogram due: 01/25/2009   Smoking status: current  (01/13/2010)   Smoking cessation counseling: yes  (02/02/2008)  Lipids   Total Cholesterol: 214  (07/04/2009)   Lipid panel action/deferral: Lipid Panel ordered   LDL: 140  (07/04/2009)   LDL Direct: 153  (11/11/2009)   HDL: 44  (07/04/2009)   Triglycerides: 152  (07/04/2009)    SGOT (AST): 23  (07/04/2009)   SGPT (ALT): 25  (07/04/2009) CMP ordered    Alkaline phosphatase: 51  (07/04/2009)   Total bilirubin: 1.0  (07/04/2009)    Lipid flowsheet reviewed?: Yes   Progress toward LDL goal: Unchanged  Self-Management Support :    Lipid self-management support: Not documented

## 2010-07-25 NOTE — Miscellaneous (Signed)
Summary: Add Statin  Clinical Lists Changes-ADD STATIN  Medications: Removed medication of TOPAMAX 50 MG TABS (TOPIRAMATE) one at bedtime (check with patient regarding price) [BMN] Added new medication of PRAVASTATIN SODIUM 40 MG TABS (PRAVASTATIN SODIUM) one qhs - Signed Rx of PRAVASTATIN SODIUM 40 MG TABS (PRAVASTATIN SODIUM) one qhs;  #30 x 6;  Signed;  Entered by: Luretha Murphy NP;  Authorized by: Luretha Murphy NP;  Method used: Electronically to [facility] [phone]*, [address], [place], [place]  [address], Ph: [phone], Fax: [phone]    Prescriptions: PRAVASTATIN SODIUM 40 MG TABS (PRAVASTATIN SODIUM) one qhs  #30 x 6   Entered and Authorized by:   Luretha Murphy NP   Signed by:   Luretha Murphy NP on 11/17/2009   Method used:   Electronically to        [facility] [phone]* (retail)       [address]       [place], [place]  [address]       Ph: [phone]       Fax: [phone]   RxID:   [id number]

## 2010-07-25 NOTE — Miscellaneous (Signed)
  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

## 2010-07-25 NOTE — Assessment & Plan Note (Signed)
Summary: dizzy,df   Vital Signs:  Patient profile:   60 year old female Height:      66 inches Weight:      218.1 pounds Pulse rate:   78 / minute BP sitting:   114 / 77  (right arm)  Vitals Entered By: [name] CMA, (April 04, 2010 10:53 AM) CC: felt dizzy off and on with numbness on left side of face. had tooth pulled left lower side....26.sept. 2011. flu shot. will sched.mammogram at [facility]. Is Patient Diabetic? No Pain Assessment Patient in pain? yes     Location: lower back Intensity: 7 Onset of pain  Chronic   Primary Care Provider:  Luretha Murphy NP  CC:  felt dizzy off and on with numbness on left side of face. had tooth pulled left lower side....26.sept. 2011. flu shot. will sched.mammogram at [facility]..  History of Present Illness: Patient had a lower molar tooth extraction on week ago and area feels numb.  Tooth was broken and decayed and requried a lot of work.  She wants to make sure that she is not having a stroke.  Her bother is in the hospital and is very sick.  He had radical neck dissection and removal of larynx for throat cancer.  This has her very upset.  Her back pain is stable, since she is no longer working it is under better control.  Habits & Providers  Alcohol-Tobacco-Diet     Tobacco Status: current     Tobacco Counseling: to quit use of tobacco products  Comments: is trying to quitt  Current Medications (verified): 1)  Flexeril 10 Mg  Tabs (Cyclobenzaprine Hcl) .... Three Times A Day Prn 2)  Tens Unti .... Use Per Pt 3)  Hydrocodone-Acetaminophen 5-500 Mg Tabs (Hydrocodone-Acetaminophen) .... Three Times A Day As Needed 4)  Sertraline Hcl 50 Mg Tabs (Sertraline Hcl) .... One 5)  Pravastatin Sodium 40 Mg Tabs (Pravastatin Sodium) .... One Qhs 6)  Aspirin 81 Mg Tbec (Aspirin)  Allergies (verified): No Known Drug Allergies  Review of Systems General:  Denies chills, fever, malaise, sleep disorder, and sweats. ENT:  numbness in area of  left mandible-. CV:  Denies chest pain or discomfort, fainting, palpitations, and swelling of feet. MS:  Denies joint pain and low back pain. Neuro:  Denies falling down, headaches, inability to speak, numbness, and visual disturbances.  Physical Exam  General:  Well-developed,well-nourished,in no acute distress; alert,appropriate and cooperative throughout examination Eyes:  No corneal or conjunctival inflammation noted. EOMI. Perrla. Funduscopic exam benign, without hemorrhages, exudates or papilledema. Vision grossly normal. Mouth:  well healded site of molar extraction. Neck:  No deformities, masses, or tenderness noted. Neurologic:  alert & oriented X3.  some decreased sendation to touch in left V3 distribution of trigeminal nerve otherwise all CN intact, strenght 5/5, gait normal Psych:  depressed affect and poor eye contact.     Impression & Recommendations:  Problem # 1:  POSTURAL LIGHTHEADEDNESS (ICD-780.4) discussed moving quickly can cause dizziness, stay hydrated, do not feel thses are signs of central TIA, she is very stressed with sick brother and has a family history of stroke. Orders: [facility]- Est Level  3 (03474)  Problem # 2:  DENTAL PAIN (ICD-525.9) resolved, left with subjective numbness in distribution of peripheral nerve, expect this to return slowly.  Complete Medication List: 1)  Flexeril 10 Mg Tabs (Cyclobenzaprine hcl) .... Three times a day prn 2)  Tens Unti  .... Use per pt 3)  Hydrocodone-acetaminophen 5-500 Mg  Tabs (Hydrocodone-acetaminophen) .... Three times a day as needed 4)  Sertraline Hcl 50 Mg Tabs (Sertraline hcl) .... One 5)  Pravastatin Sodium 40 Mg Tabs (Pravastatin sodium) .... One qhs 6)  Aspirin 81 Mg Tbec (Aspirin)  Other Orders: Influenza Vaccine NON MCR ([address]) A1C-[facility] ([address])  Patient Instructions: 1)  Aspirin 81 mg daiy   Influenza Vaccine    Vaccine Type: Fluvax Non-MCR    Site: right deltoid    Mfr: GlaxoSmithKline    Dose:  0.5 ml    Route: IM    Given by: [name] CMA,    Exp. Date: 12/20/2010    Lot #: [id number]    VIS given: 01/17/10 version given April 04, 2010.  Flu Vaccine Consent Questions    Do you have a history of severe allergic reactions to this vaccine? no    Any prior history of allergic reactions to egg and/or gelatin? no    Do you have a sensitivity to the preservative Thimersol? no    Do you have a past history of Guillan-Barre Syndrome? no    Do you currently have an acute febrile illness? no    Have you ever had a severe reaction to latex? no    Vaccine information given and explained to patient? yes    Are you currently pregnant? no  Laboratory Results   Blood Tests   Date/Time Received: April 04, 2010 11:24 AM  Date/Time Reported: April 04, 2010 11:38 AM   HGBA1C: 6.2%   (Normal Range: Non-Diabetic - 3-6%   Control Diabetic - 6-8%)  Comments: ...............test performed by......Marland KitchenBonnie A. [place], MLS (ASCP)cm

## 2010-07-25 NOTE — Assessment & Plan Note (Signed)
Summary: foot pain,df   Vital Signs:  Patient profile:   60 year old female Height:      66 inches Weight:      225.2 pounds Pulse rate:   84 / minute BP sitting:   127 / 80  (right arm) Cuff size:   large  Vitals Entered By: [name] CMA, (August 16, 2009 1:53 PM) CC: bottom of feet hurting x 3 weeks. discuss change of medication. d/c prozac and seroquel. started gabapentin, zoloft and clonazepam. Is Patient Diabetic? No Pain Assessment Patient in pain? yes     Location: feet Intensity: 9 Onset of pain  x3 weeks   Primary Care Provider:  Luretha Murphy NP  CC:  bottom of feet hurting x 3 weeks. discuss change of medication. d/c prozac and seroquel. started gabapentin and zoloft and clonazepam..  History of Present Illness: Foot pain when she first gets up in the morning and sometimes after up and around then sitting for a while.  Bought new shoes.  Back still very painful, using a cane.  Has received disability. She is very stiff.  Trying to walk daily.  Changed her diet to include more natural foods and less fried foods.  Went back to mental health after gaining a lot of weight and becoming glucose intolerant on Seroquel.  They changed meds to Zoloft, clonazepam at bedtime, and gabapentin.  She feels the gabapentin makes her sluggish.  She has not had any pain meds in a while.    She quit smoking and her friends are trying to get her to start smoking again.  Habits & Providers  Alcohol-Tobacco-Diet     Tobacco Status: quit < 6 months  Comments: quitt smoking 4 months ago  Current Medications (verified): 1)  Flexeril 10 Mg  Tabs (Cyclobenzaprine Hcl) .... Three Times A Day Prn 2)  Tens Unti .... Use Per Pt 3)  Hydrocodone-Acetaminophen 5-500 Mg Tabs (Hydrocodone-Acetaminophen) .... Three Times A Day As Needed 4)  Sertraline Hcl 50 Mg Tabs (Sertraline Hcl) .... One 5)  Klonopin 1 Mg Tabs (Clonazepam)  Allergies (verified): No Known Drug Allergies  Social  History: Smoking Status:  quit < 6 months  Review of Systems General:  Denies fatigue. MS:  Complains of low back pain and stiffness.  Physical Exam  General:  Looked well, walking slowly using a cane Lungs:  normal respiratory effort and normal breath sounds.   Heart:  normal rate and regular rhythm.     Impression & Recommendations:  Problem # 1:  DEPRESSION, MAJOR (ICD-296.20)  Improved, meds changed per mental health.  Has received long term disability  Orders: [facility]- Est  Level 4 ([address])  Problem # 2:  HYPERLIPIDEMIA (ICD-272.4)  Recheck fasting profile in 2 months, if still above 100, will begin statin.  She is not off atypical antypsychotic for one month.  Orders: [facility]- Est  Level 4 ([address]65)  Problem # 3:  SPINAL STENOSIS, LUMBAR (ICD-724.02)  Refilled hydrocodone/APAP and flexeril.  Recommend to continue to walk.  Orders: [facility]- Est  Level 4 ([address])  Problem # 4:  PLANTAR FASCIITIS, BILATERAL (ICD-728.71)  Handout, taught exercises, ice, and supportive shoes  Orders: [facility]- Est  Level 4 ([address])  Complete Medication List: 1)  Flexeril 10 Mg Tabs (Cyclobenzaprine hcl) .... Three times a day prn 2)  Tens Unti  .... Use per pt 3)  Hydrocodone-acetaminophen 5-500 Mg Tabs (Hydrocodone-acetaminophen) .... Three times a day as needed 4)  Sertraline Hcl 50 Mg Tabs (Sertraline hcl) .Marland KitchenMarland KitchenMarland Kitchen  One 5)  Klonopin 1 Mg Tabs (Clonazepam)  Patient Instructions: 1)  Please schedule a follow-up appointment in 2 months come in fasting..  Prescriptions: FLEXERIL 10 MG  TABS (CYCLOBENZAPRINE HCL) three times a day prn Brand medically necessary #90 x 0   Entered and Authorized by:   Luretha Murphy NP   Signed by:   Luretha Murphy NP on 08/16/2009   Method used:   Print then Give to Patient   RxID:   [id number] HYDROCODONE-ACETAMINOPHEN 5-500 MG TABS (HYDROCODONE-ACETAMINOPHEN) three times a day as needed Brand medically necessary #90 x 0   Entered and Authorized by:   Luretha Murphy NP    Signed by:   Luretha Murphy NP on 08/16/2009   Method used:   Print then Give to Patient   RxID:   [id number]

## 2010-07-25 NOTE — Progress Notes (Signed)
Summary: referral  Phone Note Call from Patient Call back at Home Phone [phone]   Caller: Patient Summary of Call: needs a referral to dental clinic because of tooth pain - has orange card Initial call taken by: De Nurse,  December 13, 2009 2:58 PM  Follow-up for Phone Call        message left to call back.  Follow-up by: Theresia Lo RN,  December 13, 2009 5:20 PM  Additional Follow-up for Phone Call Additional follow up Details #1::        message left again with person answering phone to have patient call back. Additional Follow-up by: Theresia Lo RN,  December 14, 2009 9:29 AM    Additional Follow-up for Phone Call Additional follow up Details #2::    message left to call back. need to speak with patient about insurance and problem she is having . Follow-up by: Theresia Lo RN,  December 14, 2009 4:35 PM  Additional Follow-up for Phone Call Additional follow up Details #3:: Details for Additional Follow-up Action Taken: spoke with patient and she states she started having pain 2 weeks ago on the lower gum. she doesn't have swelling now but may have had some at some point. states she has decayed teeth lower. advised that dental referral will not be able to be placed until July ( we have sent in our alottment for June ). If she is having an urgent problem would be able to be seen sooner but we do need to see her to  determine if she does have some infection going on. she cannot come today or tomorrow. appointment scheduled for Monday AM with Dr. Constance Goltz . she does have pain med on hand and this is controlling pain now.  Additional Follow-up by: Theresia Lo RN,  December 15, 2009 10:40 AM

## 2010-07-25 NOTE — Assessment & Plan Note (Signed)
Summary: dental pain/ls   Vital Signs:  Patient profile:   60 year old female Height:      66 inches Weight:      221 pounds BMI:     35.80 BSA:     2.09 Temp:     98.1 degrees F Pulse rate:   83 / minute BP sitting:   126 / 86  Vitals Entered By: Jone Baseman CMA (December 19, 2009 8:40 AM) CC: dental pain Is Patient Diabetic? No Pain Assessment Patient in pain? yes     Location: mouth Intensity: 9   Primary Care Provider:  Luretha Murphy NP  CC:  dental pain.  History of Present Illness: 60 yo female presenting wtih  DENTAL PAIN (ICD-525.9):  2+ week history of LEFT mandibular pain.  9/10 pain.  Hurts to eat, but is eating okay.  Worse at night when trying to go to sleep.  No fevers.  No trouble breathing or swallowing.  Using Vicodin at bedtime with minimal relief--makes her nauseated.  Requests dental referral.  Denies drug allergies.  In past has used PCN without problems.  In past has tolerated Oxycodone/APAP without nausea.  Habits & Providers  Alcohol-Tobacco-Diet     Tobacco Status: current     Tobacco Counseling: to quit use of tobacco products     Cigarette Packs/Day: 1.0  Allergies (verified): No Known Drug Allergies  Review of Systems       Per HPI.  Physical Exam  General:  Well-developed,well-nourished,in no acute distress; alert,appropriate and cooperative throughout examination Mouth:  Moist mucous membranes.  Partial plate on top.  Missing several teeth on bottom.  LEFT mandibular premolar with obvious signs of decay, though no signs of soft tissue swelling or abscess. Cervical Nodes:  No preauricular cervical adenopathy, no anterior cervical adenopathy and no posterior cervical adenopathy.     Impression & Recommendations:  Problem # 1:  DENTAL PAIN (ICD-525.9) Assessment New  Requesting dental referral.  Likely needs extraction of tooth which is noticeably decaying without signs of surrounding soft tissue infection or abscess.  Will make  referral (orange card, so may need to wait until July).  Oxycodone/APAP for pain.  PCN for prevention of spread of infection.  Red flags discussed.  Orders: [facility]- Est Level  3 (65784)  Complete Medication List: 1)  Flexeril 10 Mg Tabs (Cyclobenzaprine hcl) .... Three times a day prn 2)  Tens Unti  .... Use per pt 3)  Hydrocodone-acetaminophen 5-500 Mg Tabs (Hydrocodone-acetaminophen) .... Three times a day as needed 4)  Sertraline Hcl 50 Mg Tabs (Sertraline hcl) .... One 5)  Pravastatin Sodium 40 Mg Tabs (Pravastatin sodium) .... One qhs 6)  Oxycodone-acetaminophen 5-325 Mg Tabs (Oxycodone-acetaminophen) .Marland Kitchen.. 1 tab by mouth up to two times a day for dental pain 7)  Penicillin V Potassium 500 Mg Tabs (Penicillin v potassium) .Marland Kitchen.. 1 tab by mouth three times a day  Patient Instructions: 1)  Good to see you today.  Sorry you are having such pain. 2)  Penicillin to prevent infection. 3)  Oxycodone/Acetaminophen for pain.  Do not take this and the Vicodin at the same time! 4)  Call if you develop fever. 5)  Go immediately to the emergency room if you develop swelling in your mouth or have difficulty breathing or swallowing. 6)  We are arranging a dental visit. Prescriptions: PENICILLIN V POTASSIUM 500 MG TABS (PENICILLIN V POTASSIUM) 1 tab by mouth three times a day  #30 x 0   Entered and  Authorized by:   Romero Belling MD   Signed by:   Romero Belling MD on 12/19/2009   Method used:   Electronically to        [facility] [phone]* (retail)       [address]       [place], [place]  [address]       Ph: [phone]       Fax: [phone]   RxID:   [id number] OXYCODONE-ACETAMINOPHEN 5-325 MG TABS (OXYCODONE-ACETAMINOPHEN) 1 tab by mouth up to two times a day for dental pain  #30 x 0   Entered and Authorized by:   Romero Belling MD   Signed by:   Romero Belling MD on 12/19/2009   Method used:   Print then Give to Patient   RxID:   [id number]   Appended Document: dental  pain/ls     Allergies: No Known Drug Allergies   Complete Medication List: 1)  Flexeril 10 Mg Tabs (Cyclobenzaprine hcl) .... Three times a day prn 2)  Tens Unti  .... Use per pt 3)  Hydrocodone-acetaminophen 5-500 Mg Tabs (Hydrocodone-acetaminophen) .... Three times a day as needed 4)  Sertraline Hcl 50 Mg Tabs (Sertraline hcl) .... One 5)  Pravastatin Sodium 40 Mg Tabs (Pravastatin sodium) .... One qhs 6)  Oxycodone-acetaminophen 5-325 Mg Tabs (Oxycodone-acetaminophen) .Marland Kitchen.. 1 tab by mouth up to two times a day for dental pain 7)  Penicillin V Potassium 500 Mg Tabs (Penicillin v potassium) .Marland Kitchen.. 1 tab by mouth three times a day  Other Orders: Dental Referral Engineer, agricultural)

## 2010-07-25 NOTE — Progress Notes (Signed)
Summary: phnmsg  Phone Note Call from Patient Call back at Home Phone [phone] Call back at [phone]   Caller: Patient Summary of Call: would like rx for a cold - she has appt 7/22 and can't wait that long also need to hear about dental referral Initial call taken by: De Nurse,  January 04, 2010 4:28 PM  Follow-up for Phone Call        left message to return call Follow-up by: Gladstone Pih,  January 04, 2010 5:16 PM  Additional Follow-up for Phone Call Additional follow up Details #1::        LM Additional Follow-up by: Golden Circle RN,  January 05, 2010 10:06 AM    Additional Follow-up for Phone Call Additional follow up Details #2::    LM/cold is supportive care, nurse to review Luretha Murphy NP  January 06, 2010 10:34 AM  Follow-up by: Golden Circle RN,  January 06, 2010 8:51 AM

## 2010-07-25 NOTE — Assessment & Plan Note (Signed)
Summary: f/u,df   Vital Signs:  Patient profile:   60 year old female Height:      66 inches Weight:      224 pounds BMI:     36.29 Temp:     98.1 degrees F oral Pulse rate:   96 / minute BP sitting:   132 / 84  (right arm) Cuff size:   large  Vitals Entered By: Tessie Fass CMA (July 04, 2009 9:47 AM)  Nutrition Counseling: Patient's BMI is greater than 25 and therefore counseled on weight management options. CC: F/U cholesterol Is Patient Diabetic? No Pain Assessment Patient in pain? yes     Location: back and legs Intensity: 8   Primary Care Provider:  Luretha Murphy NP  CC:  F/U cholesterol.  History of Present Illness: Life is a little better, has been approved for SSD and is gettting 80% retirement from her long standing job.  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  She has chosen not to have the surgery at this time.  Uses pain meds as needed.   Followed by mental health as she became very depressed with her back and inability to work.  She feels that the Seroquel is too strong and causing her to gain weight. Has been trying to walk daily.  She has Quit Smoking, and feels if she could do that she could do anything.  Habits & Providers  Alcohol-Tobacco-Diet     Tobacco Status: quit  Current Medications (verified): 1)  Flexeril 10 Mg  Tabs (Cyclobenzaprine Hcl) .... Three Times A Day Prn 2)  Tens Unti .... Use Per Pt 3)  Fluoxetine Hcl 20 Mg Tabs (Fluoxetine Hcl) 4)  Seroquel Xr 300 Mg Xr24h-Tab (Quetiapine Fumarate) 5)  Hydrocodone-Acetaminophen 5-500 Mg Tabs (Hydrocodone-Acetaminophen) .... Three Times A Day As Needed  Allergies (verified): No Known Drug Allergies  Review of Systems General:  Denies malaise and sleep disorder. CV:  Denies chest pain or discomfort. MS:  Complains of low back pain. Psych:  Denies depression and mental problems.  Physical Exam  General:  Well appreaing, speaks slowly Lungs:  normal  respiratory effort and normal breath sounds.   Heart:  normal rate and regular rhythm.     Impression & Recommendations:  Problem # 1:  DEPRESSION, MAJOR (ICD-296.20)  Improving, has apt with mental health on 1/28, recommended the begin to wean Seroquel.  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  Uses pain meds appropriately and has never overused.  Still using TENS unit.  Has not been doing home exercise program, encouraged to do so.  Orders: [facility]- Est Level  3 (57846)  Problem # 4:  OBESITY, NOS (ICD-278.00)  Discussed different strategies from buring calories to calorie reduction.  Orders: [facility]- Est Level  3 (96295)  Complete Medication List: 1)  Flexeril 10 Mg Tabs (Cyclobenzaprine hcl) .... Three times a day prn 2)  Tens Unti  .... Use per pt 3)  Fluoxetine Hcl 20 Mg Tabs (Fluoxetine hcl) 4)  Seroquel Xr 300 Mg Xr24h-tab (Quetiapine fumarate) 5)  Hydrocodone-acetaminophen 5-500 Mg Tabs (Hydrocodone-acetaminophen) .... Three times a day as needed  Other Orders: Influenza Vaccine NON MCR (28413)  Patient Instructions: 1)  Cut out sugar as much as possible 2)  Eat regularly, so that you do not get  hungry 3)  Walk daily so that you get hot and breathe heavy 4)    5)  Please schedule a follow-up appointment in 3 months .  Prescriptions: HYDROCODONE-ACETAMINOPHEN 5-500 MG TABS (HYDROCODONE-ACETAMINOPHEN) three times a day as needed Brand medically necessary #90 x 0   Entered and Authorized by:   Luretha Murphy NP   Signed by:   Luretha Murphy NP on 07/04/2009   Method used:   Print then Give to Patient   RxID:   [id number]    Prevention & Chronic Care Immunizations    Influenza vaccine: Fluvax Non-MCR  (07/04/2009)    Tetanus booster: 02/24/2003: Done.   Tetanus booster due: 02/23/2013    Pneumococcal vaccine: Not documented  Colorectal Screening   Hemoccult: Not documented   Hemoccult due: Not Indicated    Colonoscopy: Adenomatous Polyp  (01/30/2007)   Colonoscopy due: 01/2012  Other Screening   Pap smear: Done.  (12/24/1998)   Pap smear due: Not Indicated    Mammogram: ASSESSMENT: Negative - BI-RADS 1^MM DIGITAL SCREENING  (02/23/2009)   Mammogram due: 01/25/2009   Smoking status: quit  (07/04/2009)  Lipids   Total Cholesterol: 209  (02/02/2008)   Lipid panel action/deferral: Lipid Panel ordered   LDL: 127  (02/02/2008)   LDL Direct: Not documented   HDL: 37  (02/02/2008)   Triglycerides: 227  (02/02/2008)    SGOT (AST): Not documented   SGPT (ALT): Not documented CMP ordered    Alkaline phosphatase: Not documented   Total bilirubin: Not documented    Lipid flowsheet reviewed?: Yes   Progress toward LDL goal: Unchanged  Self-Management Support :    Patient will work on the following items until the next clinic visit to reach self-care goals:     Eating: drink diet soda or water instead of juice or soda, eat more vegetables, use fresh or frozen vegetables, eat fruit for snacks and desserts  (07/04/2009)     Activity: take a 30 minute walk every day, take the stairs instead of the elevator, park at the far end of the parking lot  (07/04/2009)    Lipid self-management support: Not documented    Nursing Instructions: Give Flu vaccine today     Influenza Vaccine    Vaccine Type: Fluvax Non-MCR    Site: left deltoid    Mfr: GlaxoSmithKline    Dose: 0.5 ml    Route: IM    Given by: [name] CMA,    Exp. Date: 12/22/2009    Lot #: [id number]    VIS given: 01/16/07 version given July 04, 2009.  Flu Vaccine Consent Questions    Do you have a history of severe allergic reactions to this vaccine? no    Any prior  history of allergic reactions to egg and/or gelatin? no    Do you have a sensitivity to the preservative Thimersol? no    Do you have a past history of Guillan-Barre Syndrome? no    Do you currently have an acute febrile illness? no    Have you ever had a severe reaction to latex? no    Vaccine information given and explained to patient? yes    Are you currently pregnant? no

## 2010-07-26 ENCOUNTER — Encounter: Payer: Self-pay | Admitting: *Deleted

## 2010-07-27 NOTE — Assessment & Plan Note (Signed)
Summary: discuss medications/bmc   Vital Signs:  Patient profile:   60 year old female Weight:      216 pounds Temp:     98.1 degrees F oral Pulse rate:   82 / minute Pulse rhythm:   regular BP sitting:   131 / 78  (right arm) Cuff size:   regular  Vitals Entered By: Loralee Pacas CMA (July 03, 2010 2:01 PM) CC: discuss meds Is Patient Diabetic? No Pain Assessment Patient in pain? no      Comments pt states that the strength of sertraline (50mg ) is to much wanted to know if this could be lowered.  also she has been experiencing lightheadedness and nausea for 2-3 weeks   Primary Care Provider:  Luretha Murphy NP  CC:  discuss meds.  History of Present Illness: Feels dizzy and lightheaded when she misses a day of her Zoloft. She does not take it on days that she has to take her brother to chemo.  She thinks she may need a lower dose.  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  She is receiving disabiltiy at present and should get Medicare in the summer.  She needs her meds refilled, the muscle relaxant helps the most.  Habits & Providers  Alcohol-Tobacco-Diet     Tobacco Status: current     Tobacco Counseling: to quit use of tobacco products     Cigarette Packs/Day: 1.0     Diet Counseling: to improve diet; diet is suboptimal  Exercise-Depression-Behavior     Have you felt down or hopeless? yes     Have you felt little pleasure in things? yes     Depression Counseling: further diagnostic testing and/or other treatment is indicated     Seat Belt Use: always  Allergies: No Known Drug Allergies  Social History: Risk analyst Use:  always  Review of Systems      See HPI  Physical Exam  General:  Well-developed,well-nourished,in no acute distress; alert,appropriate and cooperative throughout examination Msk:  Moved well sitting to stand, LE 5/5   Impression & Recommendations:  Problem # 1:  DEPRESSION, MAJOR  (ICD-296.20) explained that she should not miss doses of the medicaion and that she is on a average dose Orders: [facility]- Est Level  3 (99213)  Problem # 2:  SPINAL STENOSIS, LUMBAR (ICD-724.02) Pain is well controlled 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  Complete Medication List: 1)  Flexeril 10 Mg Tabs (Cyclobenzaprine hcl) .... Three times a day prn 2)  Tens Unti  .... Use per pt 3)  Hydrocodone-acetaminophen 5-500 Mg Tabs (Hydrocodone-acetaminophen) .... Three times a day as needed 4)  Sertraline Hcl 50 Mg Tabs (Sertraline hcl) .... One daily 5)  Pravastatin Sodium 40 Mg Tabs (Pravastatin sodium) .... One qhs 6)  Aspirin 81 Mg Tbec (Aspirin)  Patient Instructions: 1)  Keep up the walking 2)  If you want to stop the antidepressant please come in and we will make a plan 3)  Please schedule a follow-up appointment in 3 months .  Prescriptions: PRAVASTATIN SODIUM 40 MG TABS (PRAVASTATIN SODIUM) one qhs  #30 x 6   Entered and Authorized by:   Luretha Murphy NP   Signed by:   Luretha Murphy NP on 07/03/2010   Method used:   Printed then faxed to ...       [facility]  [address] [phone]* (retail)       [address]       [place], [place]  [id number]       Ph: [phone] or [phone]       Fax: [phone]   RxID:   [id number] SERTRALINE HCL 50 MG TABS (SERTRALINE HCL) one daily  #30 x 6   Entered and Authorized by:   Luretha Murphy NP   Signed by:   Luretha Murphy NP on 07/03/2010   Method used:   Printed then faxed to ...       [facility]  [address] [phone]* (retail)       [address]       [place], [place]  [address]       Ph: [phone] or [phone]       Fax: [phone]   RxID:   [id number] HYDROCODONE-ACETAMINOPHEN 5-500 MG TABS (HYDROCODONE-ACETAMINOPHEN) three times a day as needed Brand medically necessary #30 x 1   Entered and Authorized by:    Luretha Murphy NP   Signed by:   Luretha Murphy NP on 07/03/2010   Method used:   Printed then faxed to ...       [facility]  [address] [phone]* (retail)       [address]       [place], [place]  [id number]       Ph: [phone] or [phone]       Fax: [phone]   RxID:   [id number]   Handout requested. FLEXERIL 10 MG  TABS (CYCLOBENZAPRINE HCL) three times a day prn  #90 x 3   Entered and Authorized by:   Luretha Murphy NP   Signed by:   Luretha Murphy NP on 07/03/2010   Method used:   Printed then faxed to ...       [facility]  [address] [phone]* (retail)       [address]       [place], [place]  [address]       Ph: [phone] or [phone]       Fax: [phone]   RxID:   [id number]    Orders Added: 1)  [facility]- Est Level  3 [[id number]]     Prevention & Chronic Care Immunizations   Influenza vaccine: Fluvax Non-MCR  (04/04/2010)    Tetanus booster: 02/24/2003: Done.   Tetanus booster due: 02/23/2013    Pneumococcal vaccine: Not documented  Colorectal Screening   Hemoccult: Not documented   Hemoccult due: Not Indicated    Colonoscopy: Adenomatous Polyp  (01/30/2007)   Colonoscopy due: 01/2012  Other Screening   Pap smear: Done.  (12/24/1998)   Pap smear due: Not Indicated    Mammogram: ASSESSMENT: Negative - BI-RADS 1^MM DIGITAL SCREENING  (04/12/2010)   Mammogram due: 01/25/2009   Smoking status: current  (07/03/2010)   Smoking cessation counseling: yes  (02/02/2008)  Lipids   Total Cholesterol: 160  (01/13/2010)   Lipid panel action/deferral: Lipid Panel ordered   LDL: 99  (01/13/2010)   LDL Direct: 153  (11/11/2009)   HDL: 35  (01/13/2010)   Triglycerides: 132  (01/13/2010)    SGOT (AST): 20  (01/13/2010)   SGPT (ALT): 17  (01/13/2010)   Alkaline phosphatase: 55  (01/13/2010)   Total bilirubin: 0.7  (01/13/2010)  Lipid flowsheet reviewed?: Yes   Progress toward LDL goal: At  goal  Self-Management Support :    Lipid self-management support: Not documented

## 2010-08-21 ENCOUNTER — Emergency Department (HOSPITAL_COMMUNITY)
Admission: EM | Admit: 2010-08-21 | Discharge: 2010-08-21 | Disposition: A | Discharge status: Home / Self Care | Payer: Self-pay | Attending: Emergency Medicine | Admitting: Emergency Medicine

## 2010-08-21 DIAGNOSIS — M25519 Pain in unspecified shoulder: Secondary | ICD-10-CM | POA: Insufficient documentation

## 2010-08-21 DIAGNOSIS — E7800 Pure hypercholesterolemia, unspecified: Secondary | ICD-10-CM | POA: Insufficient documentation

## 2010-08-21 DIAGNOSIS — Z79899 Other long term (current) drug therapy: Secondary | ICD-10-CM | POA: Insufficient documentation

## 2010-08-21 DIAGNOSIS — N644 Mastodynia: Secondary | ICD-10-CM | POA: Insufficient documentation

## 2010-08-21 DIAGNOSIS — N63 Unspecified lump in unspecified breast: Secondary | ICD-10-CM | POA: Insufficient documentation

## 2010-08-21 DIAGNOSIS — M129 Arthropathy, unspecified: Secondary | ICD-10-CM | POA: Insufficient documentation

## 2010-08-22 ENCOUNTER — Other Ambulatory Visit: Payer: Self-pay | Admitting: Emergency Medicine

## 2010-08-22 ENCOUNTER — Other Ambulatory Visit (HOSPITAL_COMMUNITY): Payer: Self-pay | Admitting: Emergency Medicine

## 2010-08-22 ENCOUNTER — Ambulatory Visit
Admission: RE | Admit: 2010-08-22 | Discharge: 2010-08-22 | Disposition: A | Discharge status: Home / Self Care | Payer: Self-pay | Source: Ambulatory Visit | Attending: Emergency Medicine | Admitting: Emergency Medicine

## 2010-08-22 ENCOUNTER — Ambulatory Visit: Admit: 2010-08-22 | Discharge: 2010-08-22 | Disposition: A | Discharge status: Home / Self Care | Payer: Self-pay

## 2010-08-22 DIAGNOSIS — N644 Mastodynia: Secondary | ICD-10-CM

## 2010-08-22 DIAGNOSIS — Z09 Encounter for follow-up examination after completed treatment for conditions other than malignant neoplasm: Secondary | ICD-10-CM

## 2010-08-23 ENCOUNTER — Encounter: Payer: Self-pay | Admitting: Family Medicine

## 2010-08-23 ENCOUNTER — Ambulatory Visit (INDEPENDENT_AMBULATORY_CARE_PROVIDER_SITE_OTHER): Payer: Self-pay | Admitting: Family Medicine

## 2010-08-23 VITALS — BP 132/80 | HR 90 | Temp 97.9°F | Ht 66.0 in | Wt 222.7 lb

## 2010-08-23 DIAGNOSIS — N61 Mastitis without abscess: Secondary | ICD-10-CM

## 2010-08-23 NOTE — Patient Instructions (Addendum)
Mastitis (Breast Infection)   Mastitis is a germ (bacterial) infection of the breast tissue.   CAUSES Bacteria causes infection by entering the breast tissue through cuts or openings in the skin. Typically, this occurs with breastfeeding due to cracked or irritated skin. It can be associated with plugged ducts. Nipple piercing can also lead to mastitis.   SYMPTOMS In mastitis, an area of the breast becomes puffy (swollen), red, tender, and painful. You may notice you have a fever and swelling of the glands under your arm on that side. If the infection is allowed to progress, an collection of pus (abscess) may develop.   DIAGNOSIS Your caregiver can diagnose mastitis based on your symptoms, and upon examination. The diagnosis can be confirmed if pus can be expressed from the breast. This pus can be examined in the lab to determine which bacteria are present. If an abscess has developed, the fluid in the abscess can be removed with a needle. This is used to confirm the diagnosis and determine the bacteria present.  In most cases, pus will not be present. Blood tests can be done to determine if your body is fighting a bacterial infection. Sometimes, a mammogram or ultrasound will be recommended to exclude other breast diseases including cancer.   Other rare forms of mastitis:  Tuberculosis mastitis is rare. The TB germ can affect the breast if it is present in some other part of the body. The breast may be slightly tender with a mass, but not tender or painful.  Syphilis of the nipple usually has an ulcer that is not tender.  Actinomycosis is a very rare bacteria infection of the breast that presents as a mass in the breast that is not tender or painful.  Phlebitis (inflammation of blood vessels) of the breast is an inflammation of the veins in the breast. It may be caused by tight fitting bras, surgery or trauma to the breast.  Inflammatory carcinoma of the breast looks like mastitis because the  breasts are red, swollen and tender, but it is a rare form of breast cancer.   TREATMENT Medicines that kill germs (antibiotics) are used to treat the bacterial infection. Your caregiver will determine which bacteria are most likely to be causing the infection and select an antibiotic. This is sometimes changed based on the results of cultures, or if there is no response to the antibiotic selected. Antibiotics are usually given by mouth. If you are breastfeeding, it is important to continue to empty the breast. Your caregiver can tell you whether or not this milk is safe for your infant, or needs to be thrown away. Pain can usually be treated with medication.   HOME CARE INSTRUCTIONS  Take all antibiotics as told. Do not stop them just because you have improved after a few days.  Only take over-the-counter or prescription medicines for pain, discomfort, or fever as directed by your caregiver.    If breastfeeding, keep your nipples clean and dry. Your caregiver may tell you to stop nursing until he or she feels it is safe for your baby. Use a breast pump as instructed if forced to stop nursing.  Do not wear a tight bra. Wear a good support bra.  Empty the first breast completely before going to the other breast. If your baby is not emptying your breasts completely for some reason, use a breast pump to empty your breasts.    If you go back to work, pump your breasts while at work to stay  in time with your nursing schedule.  Increase your fluid intake especially if you have a fever.  Avoid having your breasts get overly filled with milk (engorged).   SEEK MEDICAL CARE IF:  You develop pus like (purulent) discharge from the breast.  An oral temperature above 102 F (38.9 C) develops, not controlled by medication.  Your symptoms get worse.  You do not seem to be responding to your treatment within two days.   SEEK IMMEDIATE MEDICAL CARE IF:  The pain and swelling is getting worse.   You develop pain that is not controlled with medication.  You develop a red line extending from the breast toward your armpit.  Please schedule a follow up appointment in 3 weeks   Document Released: 06/11/2005  Document Re-Released: 04/08/2009 [facility] Patient Information 2011 [place], [place].

## 2010-08-23 NOTE — Assessment & Plan Note (Signed)
Agree with treatment with abx.  Continue with Doxycycline.  [facility] with [facility] in [facility] weeks as scheduled.  Follow up here in 3 weeks.  Information on mastitis given to patient.

## 2010-08-23 NOTE — Progress Notes (Signed)
  Subjective:    Patient ID: Jenna Wong, female    DOB: Apr 01, 1951, 60 y.o.   MRN: [id number]  HPI 1. Left breast pain and mass:  Pt was seen in ED 2 days ago for this complaint.  It came up all of a sudden over the weekend.  She had left breast pain, tenderness, and felt a small lump in the left breast.  In the ED they diagnosed her with mastitis and told to follow up at the [facility] and here in a couple of days.  [facility] the [facility] where they did a mammogram and U/S and agreed with the diagnosis of mastitis.  They gave her a Rx of Doxycycline which she started taking yesterday.  Since then it has been getting a little bit better.  Pain rated a 7/10 with palpation.     Review of Systems Denies fevers, chills, shortness of breath, chest pain    Objective:   Physical Exam Physical Examination: General appearance - alert, well appearing, and in no distress Neck - supple, no significant adenopathy Chest - clear to auscultation, no wheezes, rales or rhonchi, symmetric air entry Heart - normal rate, regular rhythm, normal S1, S2, no murmurs, rubs, clicks or gallops Abdomen - soft, nontender, nondistended, no masses or organomegaly Breasts - right breast normal without mass, skin or nipple changes or axillary nodes, left breast with a small 1cm nodule palpated under the nipple.  There is swelling, redness, and tenderness.  No nipple discharge.  No skin changes.        Assessment & Plan:

## 2010-08-25 ENCOUNTER — Telehealth: Payer: Self-pay | Admitting: Sports Medicine

## 2010-08-25 NOTE — Telephone Encounter (Signed)
D/w pt's sister, increasing breast pain.  Dx mastitis and placed on doxy, has been taking this 3d, breast more red and more painful.Oxycodone just makes her sick.   Advised ibuprofen 800mg  q6h, tylenol q4h, warm compress q1h.  Go to [facility] if no better in the morning to ensure no abscess formation.  Family agreeable.

## 2010-08-28 ENCOUNTER — Emergency Department (HOSPITAL_COMMUNITY)
Admission: EM | Admit: 2010-08-28 | Discharge: 2010-08-28 | Disposition: A | Discharge status: Home / Self Care | Payer: Self-pay | Attending: Emergency Medicine | Admitting: Emergency Medicine

## 2010-08-28 LAB — POCT I-STAT, CHEM 8
Glucose, Bld: 111 mg/dL — ABNORMAL HIGH (ref 70–99)
HCT: 39 % (ref 36.0–46.0)
Hemoglobin: 13.3 g/dL (ref 12.0–15.0)
Potassium: 3.5 mEq/L (ref 3.5–5.1)
Sodium: 143 mEq/L (ref 135–145)
TCO2: 23 mmol/L (ref 0–100)

## 2010-08-28 LAB — DIFFERENTIAL
Basophils Absolute: 0 10*3/uL (ref 0.0–0.1)
Basophils Relative: 0 % (ref 0–1)
Eosinophils Absolute: 0.2 10*3/uL (ref 0.0–0.7)
Eosinophils Relative: 2 % (ref 0–5)
Lymphocytes Relative: 48 % — ABNORMAL HIGH (ref 12–46)

## 2010-08-28 LAB — CBC
HCT: 38.3 % (ref 36.0–46.0)
Platelets: 265 10*3/uL (ref 150–400)
RDW: 14 % (ref 11.5–15.5)
WBC: 11.9 10*3/uL — ABNORMAL HIGH (ref 4.0–10.5)

## 2010-09-05 ENCOUNTER — Ambulatory Visit
Admission: RE | Admit: 2010-09-05 | Discharge: 2010-09-05 | Disposition: A | Discharge status: Home / Self Care | Payer: Self-pay | Source: Ambulatory Visit | Attending: Emergency Medicine | Admitting: Emergency Medicine

## 2010-09-05 DIAGNOSIS — Z09 Encounter for follow-up examination after completed treatment for conditions other than malignant neoplasm: Secondary | ICD-10-CM

## 2010-09-27 LAB — CBC
HCT: 39.7 % (ref 36.0–46.0)
HCT: 42.3 % (ref 36.0–46.0)
Hemoglobin: 13.4 g/dL (ref 12.0–15.0)
Hemoglobin: 14.6 g/dL (ref 12.0–15.0)
Platelets: 226 10*3/uL (ref 150–400)
RBC: 4.47 MIL/uL (ref 3.87–5.11)
RDW: 13.6 % (ref 11.5–15.5)
WBC: 10.2 10*3/uL (ref 4.0–10.5)
WBC: 10.4 10*3/uL (ref 4.0–10.5)

## 2010-09-27 LAB — BASIC METABOLIC PANEL
BUN: 10 mg/dL (ref 6–23)
Calcium: 8.9 mg/dL (ref 8.4–10.5)
Calcium: 9.3 mg/dL (ref 8.4–10.5)
GFR calc Af Amer: >60 mL/min (ref >60)
GFR calc non Af Amer: >60 mL/min (ref >60)
GFR calc non Af Amer: >60 mL/min (ref >60)
Glucose, Bld: 124 mg/dL — ABNORMAL HIGH (ref 70–99)
Potassium: 3.8 mEq/L (ref 3.5–5.1)
Potassium: 3.9 mEq/L (ref 3.5–5.1)
Sodium: 137 mEq/L (ref 135–145)
Sodium: 140 mEq/L (ref 135–145)

## 2010-09-27 LAB — EYE CULTURE

## 2010-09-27 LAB — DIFFERENTIAL
Eosinophils Relative: 0 % (ref 0–5)
Lymphocytes Relative: 40 % (ref 12–46)
Lymphs Abs: 4.1 10*3/uL — ABNORMAL HIGH (ref 0.7–4.0)
Monocytes Absolute: 0.5 10*3/uL (ref 0.1–1.0)
Neutro Abs: 5.5 10*3/uL (ref 1.7–7.7)

## 2010-11-10 NOTE — Op Note (Signed)
[name], Jenna Wong                 ACCOUNT NO.:  [id number]   MEDICAL RECORD NO.:  [id number]          PATIENT TYPE:  AMB   LOCATION:  ENDO                         FACILITY:  [facility]   PHYSICIAN:  Anselmo Rod, M.D.  DATE OF BIRTH:  1950-12-22   DATE OF PROCEDURE:  08/26/2006  DATE OF DISCHARGE:                               OPERATIVE REPORT   PROCEDURE PERFORMED:  Colonoscopy with cold biopsies x2.   ENDOSCOPIST:  Anselmo Rod, M.D.   INSTRUMENT USED:  Pentax video colonoscope.   INDICATIONS FOR PROCEDURE:  The patient is a 60 year old African-  American female undergoing screening colonoscopy. The patient has a  family history of colon cancer in a paternal aunt.  Rule out colonic  polyps, masses, etc.   PREPROCEDURE PREPARATION:  Informed consent was procured from the  patient. The patient was fasted for eight hours prior to the procedure  and prepped with a gallon of Trilyte the night to the procedure. The  risks and benefits of the procedure including a 10% miss rate for cancer  or polyps was discussed with the patient as well.   PREPROCEDURE PHYSICAL:  The patient had stable vital signs. Neck supple.  Chest clear to auscultation.  S1 and S2 regular. Abdomen soft with  normal bowel sounds.   DESCRIPTION OF PROCEDURE:  The patient was placed in left lateral  decubitus position and sedated with 75 mcg of fentanyl and 6 mg of  Versed in slow incremental doses. Once the patient was adequately  sedated and maintained on low flow oxygen and continuous cardiac  monitoring, the Pentax video colonoscope was advanced from the rectum to  the cecum. The appendicular orifice and ileocecal valve were clearly  visualized and photographed.  No masses, polyps, erosions, ulcerations,  etc. were seen.  There was no evidence of diverticulosis.  Two small  sessile polyps were biopsied from the rectum and sigmoid colon.  Retroflexion in the rectum revealed small internal hemorrhoids.  The  terminal ileum appeared healthy and without lesion.   IMPRESSION:  1. Normal colonoscopy to the terminal ileum except for two small      sessile polyps, biopsied in the rectosigmoid colon/  2. Small internal hemorrhoids seen on retroflexion.  3. No evidence of diverticulosis.  4. Normal terminal ileum.   RECOMMENDATIONS:  1. Await pathology results.  2. Repeat colonoscopy depending on pathology results.  3. Avoid all nonsteroidals including aspirin for the next two weeks.  4. Outpatient followup as need arises in the future.      Anselmo Rod, M.D.  Electronically Signed     JNM/MEDQ  D:  08/26/2006  T:  08/26/2006  Job:  [id number]   cc:   Wayne A. Sheffield Slider, M.D.

## 2010-12-06 ENCOUNTER — Telehealth: Payer: Self-pay | Admitting: Family Medicine

## 2010-12-06 NOTE — Telephone Encounter (Signed)
Called patient and told her that she would need to schedule an appointment with PCP to be evaluated before we could put in referral to ophthalmologist. Also explained that with the debra [place] card the referral would be based on a volunteer bases and not sure if she would be able to be seen or how long the wait will be. May also refer to [facility] for payment plan.Busick, Rodena Medin

## 2010-12-06 NOTE — Telephone Encounter (Signed)
Jenna Wong has the [facility] and is needing a referral to an eye doctor.  Has not been in 3 years and doesn't know who to go to that takes the [employer].

## 2010-12-18 ENCOUNTER — Encounter: Payer: Self-pay | Admitting: Family Medicine

## 2010-12-18 ENCOUNTER — Ambulatory Visit (INDEPENDENT_AMBULATORY_CARE_PROVIDER_SITE_OTHER): Payer: Self-pay | Admitting: Family Medicine

## 2010-12-18 VITALS — BP 130/90 | HR 90 | Temp 97.9°F | Wt 227.0 lb

## 2010-12-18 DIAGNOSIS — H109 Unspecified conjunctivitis: Secondary | ICD-10-CM | POA: Insufficient documentation

## 2010-12-18 DIAGNOSIS — Z87891 Personal history of nicotine dependence: Secondary | ICD-10-CM

## 2010-12-18 DIAGNOSIS — Z720 Tobacco use: Secondary | ICD-10-CM | POA: Insufficient documentation

## 2010-12-18 MED ORDER — CIPROFLOXACIN HCL 0.3 % OP SOLN: OPHTHALMIC | Status: DC

## 2010-12-18 NOTE — Patient Instructions (Signed)
Wash your eye lids with baby shampoo Keep face, and nose clean Do not touch eyes without washing your hands Use the eye drops as needed for 3 days

## 2010-12-18 NOTE — Assessment & Plan Note (Addendum)
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  Recommended washing eye lids with baby shampoo to cut down on crusting.

## 2010-12-18 NOTE — Progress Notes (Signed)
  Subjective:    Patient ID: Jenna Wong, female    DOB: Sep 18, 1950, 60 y.o.   MRN: [id number]  HPI Recurrent eye infections.  Eyes itch and crust.  She rubs them often.  She would like to see an eye doctor but has not health insurance, expects Medicare to activate in 3 months.  She worries that she has cataracts.   She has stopped smoking, but is gaining weight. She is trying to walk.  Has a very limited budget and has been eating at [employer] on [address].  Review of Systems  Constitutional: Negative for fever.  HENT: Negative for facial swelling and dental problem.   Eyes: Positive for discharge, redness and itching. Negative for photophobia, pain and visual disturbance.  Respiratory: Negative for cough.        Objective:   Physical Exam  Constitutional: She appears well-developed and well-nourished.  HENT:  Right Ear: External ear normal.  Left Ear: External ear normal.  Nose: Nose normal.  Mouth/Throat: Oropharynx is clear and moist.  Eyes: Right eye exhibits no discharge. Left eye exhibits no discharge.       Scleral conjunctiva reddened, no exudate.  Lids with dry, flaking skin. Clear lens, normal fundoscopic exam.  Neck: Normal range of motion. Neck supple.  Cardiovascular: Normal rate, regular rhythm and normal heart sounds.   Pulmonary/Chest: Effort normal and breath sounds normal.          Assessment & Plan:

## 2010-12-24 ENCOUNTER — Other Ambulatory Visit: Payer: Self-pay | Admitting: Family Medicine

## 2010-12-25 NOTE — Telephone Encounter (Signed)
Refill request

## 2011-03-26 LAB — COMPREHENSIVE METABOLIC PANEL
ALT: 22
Alkaline Phosphatase: 75
BUN: 10
CO2: 25
GFR calc non Af Amer: >60
Glucose, Bld: 120 — ABNORMAL HIGH
Potassium: 4.1
Sodium: 139
Total Bilirubin: 0.5

## 2011-03-26 LAB — URINALYSIS, ROUTINE W REFLEX MICROSCOPIC
Glucose, UA: NEGATIVE
Ketones, ur: NEGATIVE
Leukocytes, UA: NEGATIVE
Protein, ur: NEGATIVE
Urobilinogen, UA: 0.2

## 2011-03-26 LAB — DIFFERENTIAL
Basophils Absolute: 0.1
Basophils Relative: 1
Eosinophils Absolute: 0.2
Monocytes Relative: 6
Neutro Abs: 4.5
Neutrophils Relative %: 47

## 2011-03-26 LAB — CBC
HCT: 43.5
Hemoglobin: 14.6
RBC: 4.53
RDW: 13.7

## 2011-03-26 LAB — URINE MICROSCOPIC-ADD ON

## 2011-03-26 LAB — LIPASE, BLOOD: Lipase: 19

## 2011-04-10 ENCOUNTER — Ambulatory Visit (INDEPENDENT_AMBULATORY_CARE_PROVIDER_SITE_OTHER): Payer: Self-pay | Admitting: Family Medicine

## 2011-04-10 ENCOUNTER — Encounter: Payer: Self-pay | Admitting: Family Medicine

## 2011-04-10 VITALS — BP 127/84 | HR 87 | Temp 99.0°F | Ht 67.0 in | Wt 225.2 lb

## 2011-04-10 DIAGNOSIS — M545 Low back pain: Secondary | ICD-10-CM

## 2011-04-10 DIAGNOSIS — Z7189 Other specified counseling: Secondary | ICD-10-CM

## 2011-04-10 DIAGNOSIS — Z87891 Personal history of nicotine dependence: Secondary | ICD-10-CM

## 2011-04-10 DIAGNOSIS — E669 Obesity, unspecified: Secondary | ICD-10-CM

## 2011-04-10 DIAGNOSIS — Z23 Encounter for immunization: Secondary | ICD-10-CM

## 2011-04-10 DIAGNOSIS — Z719 Counseling, unspecified: Secondary | ICD-10-CM

## 2011-04-10 DIAGNOSIS — Z111 Encounter for screening for respiratory tuberculosis: Secondary | ICD-10-CM

## 2011-04-10 DIAGNOSIS — F329 Major depressive disorder, single episode, unspecified: Secondary | ICD-10-CM

## 2011-04-10 DIAGNOSIS — E785 Hyperlipidemia, unspecified: Secondary | ICD-10-CM

## 2011-04-10 NOTE — Progress Notes (Signed)
  Subjective:    Patient ID: Jenna Wong, female    DOB: 1950/10/08, 60 y.o.   MRN: [id number]  HPI Patient here for annual exam:  Health maintenance: Hysterectomy-in 90s-unsure if cervix still present. Unsure if she needs Pap smears. Last Pap smear multiple years ago. Mammogram-gets yearly-will schedule for February. Flu shot-requesting to get today Colonoscopy-in March 2008 had Hyperplastic polyps, no cancer. Was told to return in 5 years. Patient states she will schedule for beginning of year when she has insurance.  Depression: PHQ9 score of 20 patient endorses little interest or pleasure in doing things, feeling down, problems with sleep, positive over eating, decreased energy, problems with concentration, feeling down about herself, denies SI. Denies HI.  Obesity: Tries to walk daily-usually exercises 3-4 times per week , does some rehabilitation exercises for back. Very few fruits and vegetables. Many days does not have any fruits or vegetables.  Hyperlipidemia: Taking pravastatin. Agrees to recheck a lipid panel today.  Back pain: Long history of back problems, takes Flexeril 3-4 days per week , takes Vicodin 3-4 times per week at bedtime to help with back pain. Does rehabilitation exercises as prescribed by PT 3 times a week.  Form completion: Patient requests medical forms be completed. These are required by state in order to to foster care. Applying currently to be a foster parent.   Review of Systems As per above. No chest pain. No shortness of breath. No fever. No weight loss.    Objective:   Physical Exam  Constitutional: She is oriented to person, place, and time. She appears well-developed and well-nourished.  HENT:  Right Ear: External ear normal.  Left Ear: External ear normal.  Mouth/Throat: No oropharyngeal exudate.  Eyes: Pupils are equal, round, and reactive to light. Right eye exhibits no discharge. Left eye exhibits no discharge.  Neck: Normal range  of motion. No thyromegaly present.  Cardiovascular: Normal rate, regular rhythm and normal heart sounds.  Exam reveals no gallop and no friction rub.   No murmur heard. Pulmonary/Chest: Effort normal and breath sounds normal. No respiratory distress. She has no wheezes.  Abdominal: Soft. She exhibits no distension.  Musculoskeletal: Normal range of motion. She exhibits no edema.  Lymphadenopathy:    She has no cervical adenopathy.  Neurological: She is alert and oriented to person, place, and time.  Skin: Skin is warm. No rash noted.  Psychiatric: She has a normal mood and affect. Her behavior is normal.          Assessment & Plan:

## 2011-04-10 NOTE — Patient Instructions (Signed)
Cholesterol: Check labs today  Depression: Call Dr. Pascal Lux to schedule an appointment for therapy.  Return in 2-4 weeks for follow up for further eval of depression.  Health maintence: Mammogram and colonoscopy need to be scheduled after December when you get insurance.  I will look at surgery record to see if you have your cervix still, and if  You need pap smear.   Weight management: Continue the exercise- daily walking Increase fruit and vegetable intake- recommendation 5-9 fruits and vegtables.  If you would like help with weight management we could meet on a monthly basis to work on this- just let me know.   Tobacco cessation: Consider calling 1-800-quit-now

## 2011-04-11 ENCOUNTER — Encounter: Payer: Self-pay | Admitting: Family Medicine

## 2011-04-11 DIAGNOSIS — Z719 Counseling, unspecified: Secondary | ICD-10-CM | POA: Insufficient documentation

## 2011-04-11 LAB — LIPID PANEL
HDL: 41 mg/dL (ref >39)
LDL Cholesterol: 140 mg/dL — ABNORMAL HIGH (ref 0–99)
Total CHOL/HDL Ratio: 5.1 Ratio
Triglycerides: 135 mg/dL (ref <150)

## 2011-04-11 LAB — COMPREHENSIVE METABOLIC PANEL
ALT: 21 U/L (ref 0–35)
Alkaline Phosphatase: 62 U/L (ref 39–117)
CO2: 25 mEq/L (ref 19–32)
Creat: 0.83 mg/dL (ref 0.50–1.10)
Glucose, Bld: 124 mg/dL — ABNORMAL HIGH (ref 70–99)
Total Bilirubin: 0.5 mg/dL (ref 0.3–1.2)

## 2011-04-11 NOTE — Assessment & Plan Note (Signed)
Discussed importance of weight management with patient. Patient is walking 3 times a week. Encouraged her to increase time spent in activity. And try to make it daily. Also encouraged healthy diet. Discussed resources for patient for weight management-including nutrition consult and frequent visits with myself to focus on weight management.

## 2011-04-11 NOTE — Assessment & Plan Note (Signed)
Hysterectomy-in 90s-unsure if cervix still present. Unsure if she needs Pap smears. Last Pap smear multiple years ago. Mammogram-gets yearly-will schedule for February. Flu shot-requesting to get today Colonoscopy-in March 2008 had Hyperplastic polyps, no cancer. Was told to return in 5 years. Patient states she will schedule for beginning of year when she has insurance.

## 2011-04-11 NOTE — Assessment & Plan Note (Signed)
Patient has been on long-term muscle relaxer and Vicodin. States she uses 3-4 times per week. On the bad days. Also does his home physical therapy. Has had rehabilitation for this chronic back pain. We'll continue with current care plan as prescribed by previous PCP.

## 2011-04-11 NOTE — Assessment & Plan Note (Signed)
Encouraged smoking cessation 

## 2011-04-11 NOTE — Assessment & Plan Note (Signed)
Due for lipid recheck. We'll do this today as well as check complete metabolic panel. Patient taking pravastatin 40 daily.

## 2011-04-11 NOTE — Assessment & Plan Note (Addendum)
Score of 20 on PHQ-9. Last therapy one year ago. Encouraged patient to see therapist as this was helpful for her in the past. Patient currently on Zoloft 50 mg daily. Patient to return in one to 2 weeks for more detailed assessment of depression and medication management.

## 2011-04-12 ENCOUNTER — Ambulatory Visit (INDEPENDENT_AMBULATORY_CARE_PROVIDER_SITE_OTHER): Payer: Self-pay | Admitting: *Deleted

## 2011-04-12 DIAGNOSIS — IMO0001 Reserved for inherently not codable concepts without codable children: Secondary | ICD-10-CM

## 2011-04-12 DIAGNOSIS — Z111 Encounter for screening for respiratory tuberculosis: Secondary | ICD-10-CM

## 2011-04-12 LAB — TB SKIN TEST
Induration: 0
TB Skin Test: NEGATIVE mm

## 2011-04-25 ENCOUNTER — Encounter: Payer: Self-pay | Admitting: Family Medicine

## 2011-07-18 ENCOUNTER — Ambulatory Visit (INDEPENDENT_AMBULATORY_CARE_PROVIDER_SITE_OTHER): Payer: Medicare Other | Admitting: Family Medicine

## 2011-07-18 ENCOUNTER — Encounter: Payer: Self-pay | Admitting: Family Medicine

## 2011-07-18 DIAGNOSIS — E785 Hyperlipidemia, unspecified: Secondary | ICD-10-CM

## 2011-07-18 DIAGNOSIS — N611 Abscess of the breast and nipple: Secondary | ICD-10-CM

## 2011-07-18 DIAGNOSIS — H109 Unspecified conjunctivitis: Secondary | ICD-10-CM

## 2011-07-18 DIAGNOSIS — N61 Mastitis without abscess: Secondary | ICD-10-CM

## 2011-07-18 DIAGNOSIS — M545 Low back pain, unspecified: Secondary | ICD-10-CM

## 2011-07-18 DIAGNOSIS — F329 Major depressive disorder, single episode, unspecified: Secondary | ICD-10-CM

## 2011-07-18 HISTORY — DX: Abscess of the breast and nipple: N61.1

## 2011-07-18 MED ORDER — MUPIROCIN CALCIUM 2 % EX CREA: TOPICAL_CREAM | Freq: Three times a day (TID) | CUTANEOUS | Status: AC

## 2011-07-18 MED ORDER — CYCLOBENZAPRINE HCL 10 MG PO TABS: 10.0000 mg | ORAL_TABLET | Freq: Three times a day (TID) | ORAL | Status: DC | PRN

## 2011-07-18 MED ORDER — PRAVASTATIN SODIUM 80 MG PO TABS: 80.0000 mg | ORAL_TABLET | Freq: Every day | ORAL | Status: DC

## 2011-07-18 MED ORDER — HYDROCODONE-ACETAMINOPHEN 5-500 MG PO TABS: 1.0000 | ORAL_TABLET | Freq: Three times a day (TID) | ORAL | Status: DC | PRN

## 2011-07-18 MED ORDER — SERTRALINE HCL 50 MG PO TABS: 50.0000 mg | ORAL_TABLET | Freq: Every day | ORAL | Status: DC

## 2011-07-18 MED ORDER — CIPROFLOXACIN HCL 0.3 % OP SOLN: OPHTHALMIC | Status: DC

## 2011-07-18 NOTE — Progress Notes (Signed)
Jenna Wong is a 61 y.o. female who presents to [facility] today for  1) Abscess under left breast. Present for around 1 week. It started draining yesterday. It was associated with pain and tenderness. No fevers or chills. She notes a similar episode a few years ago.  Pain improved with spontaneous drainage.   2) Depression: Saw Dr. Edmonia James for Well woman check a few months ago. At that time her PHQ9 was 20. She noted a lifetime of depressed mood. She denies any manic episodes. She is happy with her Zoloft 50 and resistant to increasing it as she had some oversedation with Seroquel. She denies and SI/HI.   3) HLD: On Pravastatin 40. Notes that a FLP a  Few months ago had a LDL of 140. She denies any chest pain or pressure. She feels well otherwise.   4) Chronic Back and Neck Pain: For years. Well controled with Flexeril and intermittent Hydrocodone. She estimates that she takes around 10 tablets a week.   PMH reviewed.  ROS as above otherwise neg Medications reviewed. Current Outpatient Prescriptions  Medication Sig Dispense Refill  . aspirin 81 MG tablet Take 81 mg by mouth daily.        . ciprofloxacin (CILOXAN) 0.3 % ophthalmic solution One drop in affected eye tid for 3 days  5 mL  12  . cyclobenzaprine (FLEXERIL) 10 MG tablet Take 1 tablet (10 mg total) by mouth 3 (three) times daily as needed.  30 tablet  12  . HYDROcodone-acetaminophen (VICODIN) 5-500 MG per tablet Take 1 tablet by mouth 3 (three) times daily as needed.  30 tablet  5  . mupirocin cream (BACTROBAN) 2 % Apply topically 3 (three) times daily.  15 g  0  . pravastatin (PRAVACHOL) 80 MG tablet Take 1 tablet (80 mg total) by mouth at bedtime.  90 tablet  3  . sertraline (ZOLOFT) 50 MG tablet Take 1 tablet (50 mg total) by mouth daily.  90 tablet  3  . DISCONTD: ciprofloxacin (CILOXAN) 0.3 % ophthalmic solution One drop in affected eye tid for 3 days  5 mL  0  . DISCONTD: cyclobenzaprine (FLEXERIL) 10 MG tablet Take 10 mg by mouth 3  (three) times daily as needed.       Marland Kitchen DISCONTD: HYDROcodone-acetaminophen (VICODIN) 5-500 MG per tablet Take 1 tablet by mouth 3 (three) times daily as needed.        Marland Kitchen DISCONTD: pravastatin (PRAVACHOL) 40 MG tablet TAKE ONE TABLET BY MOUTH EVERY DAY AT BEDTIME  30 tablet  1  . DISCONTD: sertraline (ZOLOFT) 50 MG tablet Take 50 mg by mouth daily.         Exam:  BP 120/85  Pulse 102  Temp(Src) 98.2 F (36.8 C) (Oral)  Ht 5\' 7"  (1.702 m)  Wt 223 lb (101.152 kg)  BMI 34.93 kg/m2 Gen: Well NAD HEENT: EOMI,  MMM Lungs: CTABL Nl WOB Heart: RRR no MRG Abd: NABS, NT, ND Exts: Non edematous BL  LE, warm and well perfused.  Skin: Draining abscess under left breast. Small amount of puss expressible. No significant surrounding erythremia. Mildly TTP  MSK: Non tender spinal midline. Gait normal and coordination intact.

## 2011-07-18 NOTE — Assessment & Plan Note (Signed)
Plan to increase pravastatin to 80 daily.

## 2011-07-18 NOTE — Assessment & Plan Note (Signed)
Will refil Rx for both vicodin and flexeril today as these doses are stable. Will follow up in 1 month.

## 2011-07-18 NOTE — Patient Instructions (Signed)
Thank you for coming in today. Please use warm compress and the antibiotic cream for your abscess.  Let me know if it closes up and starts hurting again.   For your cholesterol please take pravastatin 80mg  at night.   For your mood continue zoloft 50. Please see your therapist and see me in 1-2 months to follow this issue up.  I am concerned that you mood is not as well controlled as it could be.

## 2011-07-18 NOTE — Assessment & Plan Note (Signed)
Chief complaint today.  Abscess is draining by itself. Plan to encourage warm compress. Will Rx Bactroban cream to prevent andy surrounding cellulitis. F/U in 1 month or so. Warned about worsening infection.

## 2011-07-18 NOTE — Assessment & Plan Note (Signed)
Will think about increasing dose to 100 in the next visit. Also will do MDQ to evaluate for bipolar depression. With her history of seroquel I am suspicious for Bipolar II. Will follow. No SI/HI today.

## 2011-08-02 ENCOUNTER — Telehealth: Payer: Self-pay | Admitting: Family Medicine

## 2011-08-02 NOTE — Telephone Encounter (Signed)
Needs most recent labs faxed to [phone].

## 2011-08-02 NOTE — Telephone Encounter (Signed)
Faxed labs/notes to Dr.Hung's office . Pt has appt today .Jenna Wong

## 2011-09-24 ENCOUNTER — Telehealth: Payer: Self-pay | Admitting: Family Medicine

## 2011-09-24 NOTE — Telephone Encounter (Signed)
Opened in error

## 2011-10-01 ENCOUNTER — Telehealth: Payer: Self-pay | Admitting: Family Medicine

## 2011-10-01 NOTE — Telephone Encounter (Signed)
Certification of Disability form placed in Dr. Zollie Pee box for completion.  Ileana Ladd

## 2011-10-01 NOTE — Telephone Encounter (Signed)
Certification of Disability form to be completed by Denyse Amass.

## 2011-10-02 NOTE — Telephone Encounter (Signed)
I will need some sort of documentation or an office visit to complete this form. Please ask the patient to send records or come in for a visit.

## 2011-10-02 NOTE — Telephone Encounter (Signed)
I have ask Jenna Wong to call and scheduled Jenna Wong for an Astronomer.  Jenna Wong

## 2011-10-02 NOTE — Telephone Encounter (Signed)
Spoke with Jenna Wong and made appt with provider on 4/17.  Double booked the 1:45 slot.  Told patient to be here at 1:30.

## 2011-10-10 ENCOUNTER — Encounter: Payer: Self-pay | Admitting: Family Medicine

## 2011-10-10 ENCOUNTER — Ambulatory Visit (INDEPENDENT_AMBULATORY_CARE_PROVIDER_SITE_OTHER): Payer: Medicare Other | Admitting: Family Medicine

## 2011-10-10 VITALS — BP 111/77 | HR 83 | Ht 67.0 in | Wt 224.0 lb

## 2011-10-10 DIAGNOSIS — F329 Major depressive disorder, single episode, unspecified: Secondary | ICD-10-CM

## 2011-10-10 DIAGNOSIS — K589 Irritable bowel syndrome without diarrhea: Secondary | ICD-10-CM

## 2011-10-10 DIAGNOSIS — M545 Low back pain, unspecified: Secondary | ICD-10-CM

## 2011-10-10 HISTORY — DX: Irritable bowel syndrome, unspecified: K58.9

## 2011-10-10 MED ORDER — ACIDOPHILUS PROBIOTIC 100 MG PO CAPS: 1.0000 | ORAL_CAPSULE | Freq: Every day | ORAL | Status: DC

## 2011-10-10 NOTE — Patient Instructions (Addendum)
Thank you for coming in today. Please come back in 1-2 months for a cholesterol check. We can talk about your weight then too.

## 2011-10-11 ENCOUNTER — Encounter: Payer: Self-pay | Admitting: Family Medicine

## 2011-10-11 NOTE — Assessment & Plan Note (Signed)
Chronic back pain severe enough for permanent disability.  Exam seems to be stable today. Plan to continue current medical regimen.

## 2011-10-11 NOTE — Progress Notes (Signed)
Jenna Wong is a 61 y.o. female who presents to [facility] today for discussion of her disability.  She is here to discuss her disability. She has permanent disability but to chronic back pain and poor mental health.     1) back pain: Secondary to spinal stenosis and cervical disc disease.  Notes difficulty and pain with prolonged standing or walking. She often has to change positions. She is not currently working and is on permanent disability. Denies any bowel or bladder dysfunction or new weakness.    2) mental health: Care is a diagnosis of depression but may have been diagnosed with bipolar disorder at psychiatry. She is not exactly sure. 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  She has trouble following directions.  She feels well currently and denies any suicidality or homicidality.   PMH, SH reviewed: Significant for chronic back pain ROS as above otherwise neg. No Chest pain, palpitations, SOB, Fever, Chills, Abd pain, N/V/D.  Medications reviewed. Current Outpatient Prescriptions  Medication Sig Dispense Refill  . aspirin 81 MG tablet Take 81 mg by mouth daily.        . ciprofloxacin (CILOXAN) 0.3 % ophthalmic solution One drop in affected eye tid for 3 days  5 mL  12  . cyclobenzaprine (FLEXERIL) 10 MG tablet Take 1 tablet (10 mg total) by mouth 3 (three) times daily as needed.  30 tablet  12  . HYDROcodone-acetaminophen (VICODIN) 5-500 MG per tablet Take 1 tablet by mouth 3 (three) times daily as needed.  30 tablet  5  . Lactobacillus (ACIDOPHILUS PROBIOTIC) 100 MG CAPS Take 1 capsule (100 mg total) by mouth daily.  30 capsule  12  . pravastatin (PRAVACHOL) 80 MG tablet Take 1 tablet (80 mg total) by mouth at bedtime.  90 tablet  3  . sertraline (ZOLOFT) 50 MG tablet Take 1 tablet (50 mg total) by mouth daily.  90 tablet  3    Exam:  BP 111/77  Pulse 83  Ht 5\' 7"  (1.702 m)  Wt 224 lb (101.606 kg)  BMI 35.08 kg/m2 Gen: Well NAD Lungs: CTABL Nl WOB Heart: RRR no MRG MSK: Back:  Nontender over spinal midline.  Right SI joint is tender to palpation. Reflexes are equal bilaterally strength and sensation are intact. Patient is able to walk slowly but without a significant limp.   Psychiatry: Alert and oriented mood is normal affect is mildly flat. No SI/HI. Speech is normal and thought process is normal.  No results found for this or any previous visit (from the past 72 hour(s)).

## 2011-10-11 NOTE — Assessment & Plan Note (Signed)
Currently stable with Zoloft. We'll continue to follow

## 2011-11-01 ENCOUNTER — Telehealth: Payer: Self-pay | Admitting: Family Medicine

## 2011-11-01 NOTE — Telephone Encounter (Signed)
Patient dropped off disability form to be filled out.  Please call when completed.  She needs asap.

## 2011-11-01 NOTE — Telephone Encounter (Signed)
[facility] Certification of Disability for Property Tax Exclusion placed in Dr. Zollie Pee box for completion.  Ileana Ladd

## 2011-11-02 NOTE — Telephone Encounter (Signed)
Filled out and put in to be faxed box

## 2011-11-15 NOTE — Telephone Encounter (Signed)
Ms. Laakso notified form is ready to be picked up at front desk.  Ileana Ladd

## 2011-11-28 ENCOUNTER — Other Ambulatory Visit: Payer: Self-pay | Admitting: Family Medicine

## 2011-11-28 MED ORDER — MUPIROCIN CALCIUM 2 % EX CREA: TOPICAL_CREAM | Freq: Three times a day (TID) | CUTANEOUS | Status: AC

## 2012-01-30 ENCOUNTER — Encounter: Payer: Self-pay | Admitting: Family Medicine

## 2012-01-30 ENCOUNTER — Ambulatory Visit (INDEPENDENT_AMBULATORY_CARE_PROVIDER_SITE_OTHER): Payer: Medicare Other | Admitting: Family Medicine

## 2012-01-30 VITALS — BP 134/81 | HR 75 | Ht 67.0 in | Wt 217.0 lb

## 2012-01-30 DIAGNOSIS — M545 Low back pain: Secondary | ICD-10-CM

## 2012-01-30 DIAGNOSIS — G8929 Other chronic pain: Secondary | ICD-10-CM

## 2012-01-30 DIAGNOSIS — R631 Polydipsia: Secondary | ICD-10-CM

## 2012-01-30 DIAGNOSIS — E785 Hyperlipidemia, unspecified: Secondary | ICD-10-CM

## 2012-01-30 LAB — GLUCOSE, CAPILLARY: Glucose-Capillary: 147 mg/dL — ABNORMAL HIGH (ref 70–99)

## 2012-01-30 MED ORDER — HYDROCODONE-ACETAMINOPHEN 5-500 MG PO TABS: 1.0000 | ORAL_TABLET | Freq: Three times a day (TID) | ORAL | Status: DC | PRN

## 2012-01-30 NOTE — Assessment & Plan Note (Addendum)
The patient has been on pravastatin 80 mg daily since Jan 2013, without recheck. Get LDL today and complete panel in October.

## 2012-01-30 NOTE — Assessment & Plan Note (Addendum)
Stable exam. Continue regimen of Dr. Denyse Amass with Vicodin 5-500 PRN and Flexeril PRN. Encouraged exercise and stretching. New pain contract signed and urine drug screen ordered.

## 2012-01-30 NOTE — Assessment & Plan Note (Signed)
Unknown origin. Check CBG.

## 2012-01-30 NOTE — Progress Notes (Signed)
  Subjective:    Patient ID: LILIANN FILE, female    DOB: 15-Jan-1951, 61 y.o.   MRN: [id number]  HPI  61 year old F who presents for evaluation of chronic lower back pain and cholesterol management.   1. 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 There is also some lateral disc protrusion on the left at L2-3 and L3-4. Disc degeneration and spondylosis at L4-5 and L5-S1. Red Flags: no falls, no numbness and tingling, no impaired bowel or bladder function   2. HYPERLIPIDEMIA Management: Patient taking pravastatin 80 mg daily since October 2012, has not missed any doses, no side effects noted  Diet: Not following low cholesterol diet Exercise: walks regularly  Wt Readings from Last 3 Encounters:  01/30/12 217 lb (98.431 kg)  10/10/11 224 lb (101.606 kg)  07/18/11 223 lb (101.152 kg)   ROS:  Denies RUQ pain, myalgias, or symptoms or coronary ischemia Lab Results  Component Value Date   LDLCALC 140* 04/10/2011   Lab Results  Component Value Date   CHOL 208* 04/10/2011   CHOL 160 01/13/2010   CHOL 214* 07/04/2009   Lab Results  Component Value Date   HDL 41 04/10/2011   HDL 35* 01/13/2010   HDL 44 07/04/2009    3. Polydipsia - Increased thirst over past 6 months, no increased urination, no history of diabetes or elevated CBG, many family members with Type 2 DM      Review of Systems  All other systems reviewed and are negative.       Objective:   Physical  Exam BP 134/81  Pulse 75  Ht 5\' 7"  (1.702 m)  Wt 217 lb (98.431 kg)  BMI 33.99 kg/m2 Gen: alert, oriented x 4, mild distress CV: RRR, no murmurs Lungs: CTA-B Back: mild TTP of lumbar spine, 4/5 strength right hip flexion, 5/5 right knee extension and flexion; 5/5 left hip flexion, knee flexion and extension     Assessment & Plan:  61 year old F with chronic lower back pain from lumbar spinal stenosis and spondylosis.

## 2012-01-30 NOTE — Patient Instructions (Signed)
Dear Jenna Wong,   Thank you for coming to clinic today. Please read below regarding the issues that we discussed.   1. Back Pain - We will continue the same regimen of flexeril and vicodin for pain management. Please continue to stretch and exercise daily.   2. Cholesterol and Blood Sugar - I will send you a letter with your results and let you know if we need to make any changes to your medications.   Please follow up in clinic in 12 weeks . Please call earlier if you have any questions or concerns.   Sincerely,   Dr. Clinton Sawyer

## 2012-01-31 LAB — DRUG SCR UR, PAIN MGMT, REFLEX CONF
Benzodiazepines.: NEGATIVE
Cocaine Metabolites: NEGATIVE
Creatinine,U: 112.03 mg/dL
Marijuana Metabolite: NEGATIVE
Phencyclidine (PCP): NEGATIVE
Propoxyphene: NEGATIVE

## 2012-02-06 ENCOUNTER — Other Ambulatory Visit: Payer: Self-pay | Admitting: Family Medicine

## 2012-02-06 ENCOUNTER — Encounter: Payer: Self-pay | Admitting: Family Medicine

## 2012-02-06 DIAGNOSIS — R739 Hyperglycemia, unspecified: Secondary | ICD-10-CM

## 2012-02-13 ENCOUNTER — Other Ambulatory Visit (INDEPENDENT_AMBULATORY_CARE_PROVIDER_SITE_OTHER): Payer: Medicare Other

## 2012-02-13 DIAGNOSIS — R7309 Other abnormal glucose: Secondary | ICD-10-CM

## 2012-02-13 DIAGNOSIS — R739 Hyperglycemia, unspecified: Secondary | ICD-10-CM

## 2012-02-13 LAB — POCT GLYCOSYLATED HEMOGLOBIN (HGB A1C): Hemoglobin A1C: 6.2

## 2012-02-13 NOTE — Progress Notes (Signed)
Pt returned for A1c per letter from Dr. Clinton Sawyer: A1c = 6.2%

## 2012-02-15 ENCOUNTER — Encounter: Payer: Self-pay | Admitting: Family Medicine

## 2012-02-15 DIAGNOSIS — R7303 Prediabetes: Secondary | ICD-10-CM | POA: Insufficient documentation

## 2012-03-13 ENCOUNTER — Ambulatory Visit (INDEPENDENT_AMBULATORY_CARE_PROVIDER_SITE_OTHER): Payer: Medicare Other | Admitting: Family Medicine

## 2012-03-13 ENCOUNTER — Encounter: Payer: Self-pay | Admitting: Family Medicine

## 2012-03-13 VITALS — BP 114/82 | HR 84 | Temp 97.8°F | Ht 67.0 in | Wt 215.0 lb

## 2012-03-13 DIAGNOSIS — L723 Sebaceous cyst: Secondary | ICD-10-CM

## 2012-03-13 DIAGNOSIS — L729 Follicular cyst of the skin and subcutaneous tissue, unspecified: Secondary | ICD-10-CM

## 2012-03-13 DIAGNOSIS — E785 Hyperlipidemia, unspecified: Secondary | ICD-10-CM

## 2012-03-13 DIAGNOSIS — Z23 Encounter for immunization: Secondary | ICD-10-CM

## 2012-03-13 NOTE — Patient Instructions (Signed)
Dear Jenna Wong,   Thank you for coming to clinic today. Please read below regarding the issues that we discussed.   1. Please schedule a lab visit for fasting morning labs to have your cholesterol check.  2. Please try to decrease your soda consumption and increase your exercise to at least 30 minutes daily.   Please follow up in clinic in 1 month. Please call earlier if you have any questions or concerns.   Sincerely,   Dr. Clinton Sawyer

## 2012-03-13 NOTE — Progress Notes (Signed)
  Subjective:    Patient ID: FLORIDA NOLTON, female    DOB: August 11, 1950, 61 y.o.   MRN: [id number]  HPI  61 year old female with chronic back pain and chronic depression presents for evaluation of a painful bump on her back.  She describes it as a"knot on back." It is located on the left posterior shoulder there is minimal tenderness, but there is no drainage or bleeding. She first noticed it approximately to go  And has been increasing in size. She notes a previous history of abscess in that area that was lanced and drained approximately 2 years ago. She is concerned in this and may need to be incised and drained as well. She denies any other painful bumps that are similar in appearance in any other locations.  Review of Systems Positive for depression, low back pain    Objective:   Physical Exam BP 114/82  Pulse 84  Temp 97.8 F (36.6 C) (Oral)  Ht 5\' 7"  (1.702 m)  Wt 215 lb (97.523 kg)  BMI 33.67 kg/m2 Gen.: middle-aged [place] American female, flat affect, poor eye contact Skin: 1 cm x 1.5 cm nontender, fluctuant subdermal mass located in the left trapezius, no purulent drainage, no erythema, no tactile warmth  Ultrasound: 0.5 x 0.5 area of hypoechogenicity consistent with subdermal cyst     Assessment & Plan:  61 year old female with a subdermal cyst that does not require any intervention.

## 2012-03-16 DIAGNOSIS — L729 Follicular cyst of the skin and subcutaneous tissue, unspecified: Secondary | ICD-10-CM | POA: Insufficient documentation

## 2012-03-16 NOTE — Assessment & Plan Note (Signed)
Simple cyst over left trapezius does not require any intervention this point. No evidence of infection, and will likely resolve spontaneously. The patient return within 2 weeks if this does change his symptoms more painful, red, or accompanied by purulent drainage.

## 2012-04-02 ENCOUNTER — Other Ambulatory Visit: Payer: Medicare Other

## 2012-04-02 DIAGNOSIS — E785 Hyperlipidemia, unspecified: Secondary | ICD-10-CM

## 2012-04-02 LAB — LIPID PANEL: Cholesterol: 205 mg/dL — ABNORMAL HIGH (ref 0–200)

## 2012-04-02 NOTE — Progress Notes (Signed)
FLP DONE TODAY Deamber Buckhalter 

## 2012-04-03 ENCOUNTER — Encounter: Payer: Self-pay | Admitting: Family Medicine

## 2012-04-09 ENCOUNTER — Ambulatory Visit (INDEPENDENT_AMBULATORY_CARE_PROVIDER_SITE_OTHER): Payer: Medicare Other | Admitting: Family Medicine

## 2012-04-09 VITALS — BP 111/77 | HR 78 | Ht 67.0 in | Wt 215.0 lb

## 2012-04-09 DIAGNOSIS — E785 Hyperlipidemia, unspecified: Secondary | ICD-10-CM

## 2012-04-09 MED ORDER — ATORVASTATIN CALCIUM 80 MG PO TABS: 80.0000 mg | ORAL_TABLET | Freq: Every day | ORAL | Status: DC

## 2012-04-09 NOTE — Progress Notes (Signed)
  Subjective:    Patient ID: KADENCE HOLGERSON, female    DOB: 31-May-1951, 61 y.o.   MRN: [id number]  HPI  61 year old F with depression, chronic low back pain, hyperglycemia and elevated cholesterol who presents for follow up of recent cholesterol labs.   HYPERLIPIDEMIA Current Medications: Pravastatin 80 mg Diet: Not following low cholesterol diet Exercise: No regular exercise Wt Readings from Last 3 Encounters:  04/09/12 215 lb (97.523 kg)  03/13/12 215 lb (97.523 kg)  01/30/12 217 lb (98.431 kg)   ROS:  Denies RUQ pain, myalgias, or symptoms or coronary ischemia Lab Results  Component Value Date   LDLCALC 137* 04/02/2012   Lab Results  Component Value Date   CHOL 205* 04/02/2012   CHOL 208* 04/10/2011   CHOL 160 01/13/2010   Lab Results  Component Value Date   HDL 37* 04/02/2012   HDL 41 04/10/2011   HDL 35* 01/13/2010   Lab Results  Component Value Date   TRIG 154* 04/02/2012   TRIG 135 04/10/2011   TRIG 132 01/13/2010   Lab Results  Component Value Date   ALT 21 04/10/2011   AST 23 04/10/2011   ALKPHOS 62 04/10/2011   BILITOT 0.5 04/10/2011         Review of Systems Positive for stress and depression Negative for chest pain, shortness of breath, TIA symptoms    Objective:   Physical Exam BP 111/77  Pulse 78  Ht 5\' 7"  (1.702 m)  Wt 215 lb (97.523 kg)  BMI 33.67 kg/m2 Gen: middle aged AAF, very depressed and withdrawn appearing today CV: RRR, no murmurs, no carotid bruits Pulm: CTA-B Extr: no edema     Assessment & Plan:  61 year old F with poorly controlled hyperlipidemia.

## 2012-04-09 NOTE — Assessment & Plan Note (Signed)
LDL 137 and borderline diabetes, so my goal for her LDL is < 100. D/C pravastatin and start atorvastatin 80 mg. F/u in 3 mos.

## 2012-04-09 NOTE — Patient Instructions (Addendum)
Please start taking Lipitor 80 mg daily. Please f/u in 3 months or sooner if needed.   Sincerely,   Dr. Clinton Sawyer

## 2012-04-14 ENCOUNTER — Encounter: Payer: Self-pay | Admitting: Home Health Services

## 2012-04-14 ENCOUNTER — Ambulatory Visit (INDEPENDENT_AMBULATORY_CARE_PROVIDER_SITE_OTHER): Payer: Medicare Other | Admitting: Home Health Services

## 2012-04-14 VITALS — BP 124/84 | HR 85 | Temp 97.2°F | Ht 67.0 in | Wt 216.9 lb

## 2012-04-14 DIAGNOSIS — Z0000 Encounter for general adult medical examination without abnormal findings: Secondary | ICD-10-CM

## 2012-04-14 NOTE — Progress Notes (Signed)
Patient here for annual wellness visit, patient reports: Risk Factors/Conditions needing evaluation or treatment: Pt does not have any new risk factors that need evaluation. Home Safety: Pt lives with sister in 2 story home.  Pt reports having smoke detectors and uses shower chair. Other Information: Corrective lens: Pt wears daily corrective lens.  Has annual eye appoints. Dentures: Pt has partial upper.  Memory: Pt denies memory problems.  Patient's Mini Mental Score (recorded in doc. flowsheet): 24 pt has low literacy, cognitive screening score may not reflect accurate cognitive ability. Hearing: pt reports no hearing problems   Balance/Gait: Pt has pain in back, hips, right leg due to work site injury.  Balance Abnormal Patient value  Sitting balance    Sit to stand x Uses arms  Attempts to arise x Longer than 10 seconds  Immediate standing balance x   Standing balance    Nudge    Eyes closed- Romberg    Tandem stance x unable  Back lean    Neck Rotation    360 degree turn    Sitting down     Gait Abnormal Patient value  Initiation of gait    Step length-left    Step length-right    Step height-left    Step height-right    Step symmetry    Step continuity    Path deviation    Trunk movement x   Walking stance x       Annual Wellness Visit Requirements Recorded Today In  Medical, family, social history Past Medical, Family, Social History Section  Current providers Care team  Current medications Medications  Wt, BP, Ht, BMI Vital signs  Hearing assessment (welcome visit) Progress note  Tobacco, alcohol, illicit drug use History  ADL Nurse Assessment  Depression Screening Nurse Assessment  Cognitive impairment Nurse Assessment  Mini Mental Status Document Flowsheet  Fall Risk Nurse Assessment  Home Safety Progress Note  End of Life Planning (welcome visit) Social Documentation  Medicare preventative services Progress Note  Risk factors/conditions needing  evaluation/treatment Progress Note  Personalized health advice Patient Instructions, goals, letter  Diet & Exercise Social Documentation  Emergency Contact Social Documentation  Seat Belts Social Documentation  Sun exposure/protection Social Documentation    Prevention Plan:   Recommended Medicare Prevention Screenings Women over 65 Test For Frequency Date of Last- BOLD if needed  Breast Cancer 1-2 yrs 2012  Cervical Cancer 1-3 yrs NI-hysterectomy  Colorectal Cancer 1-10 yrs 2013  Osteoporosis once NI due to age  Cholesterol 5 yrs 2013  Diabetes yearly 2013  HIV yearly declined  Influenza Shot yearly 2013  Pneumonia Shot once NI due to age  Zostavax Shot once NI due to age

## 2012-04-14 NOTE — Patient Instructions (Signed)
1. Try walking 2-3 times a week for short periods of time. 2. Continue to work on weight loss, by eating more fruits and vegetables. 3. Schedule mammogram. 4. Eat less fried foods and sweets.

## 2012-04-15 ENCOUNTER — Encounter: Payer: Self-pay | Admitting: Home Health Services

## 2012-04-15 NOTE — Progress Notes (Signed)
Patient ID: Jenna Wong, female   DOB: [birth year], 61 y.o.   MRN: [id number]  I have reviewed this visit and discussed with Arlys John and agree with her documentation.

## 2012-04-21 ENCOUNTER — Telehealth: Payer: Self-pay | Admitting: Family Medicine

## 2012-04-21 NOTE — Telephone Encounter (Signed)
Patient is calling because the new cholesterol medication is causing her to have diarrhea and she doesn't know if she should continue to take it or if it is normal and something her body will get used to.

## 2012-04-21 NOTE — Telephone Encounter (Signed)
Called pt. Waiting for call back. Lorenda Hatchet, Renato Battles

## 2012-04-24 ENCOUNTER — Ambulatory Visit (INDEPENDENT_AMBULATORY_CARE_PROVIDER_SITE_OTHER): Payer: Medicare Other | Admitting: Family Medicine

## 2012-04-24 VITALS — BP 135/85 | HR 99 | Ht 67.0 in | Wt 212.0 lb

## 2012-04-24 DIAGNOSIS — R200 Anesthesia of skin: Secondary | ICD-10-CM | POA: Insufficient documentation

## 2012-04-24 DIAGNOSIS — M545 Low back pain: Secondary | ICD-10-CM

## 2012-04-24 DIAGNOSIS — L723 Sebaceous cyst: Secondary | ICD-10-CM

## 2012-04-24 DIAGNOSIS — L720 Epidermal cyst: Secondary | ICD-10-CM | POA: Insufficient documentation

## 2012-04-24 DIAGNOSIS — R209 Unspecified disturbances of skin sensation: Secondary | ICD-10-CM

## 2012-04-24 NOTE — Assessment & Plan Note (Signed)
This would be a very rare side effect of Lipitor. Patient instructed to start back Lipitor at 40 mg daily for 1 week and then increase to 80 mg if tolerated. Told to d/c med immediately if swelling of lips, tongue, or throat.

## 2012-04-24 NOTE — Progress Notes (Signed)
  Subjective:    Patient ID: Jenna Wong, female    DOB: 1951-03-02, 61 y.o.   MRN: [id number]  HPI  61 year old F with hyperlipidemia, depression, and chronic lower back pain who presents for evaluation of numbness of her lips.   1. Perioral Tingling/Numbness - She believes that it is related to taking lipitor 80 mg, which she started on 04/14/12. She took it for two days and awoke with perioral numbness and tingling. This sensation dissipated by the afternoon. It was not associated with any lingual sensation loss, perioral edema, dyspnea, or facial droop. The patient stopped the medication after 2 days. She denies any similar symptoms since stopping the medication and denies any other changes in medication or food on the days of symptom onset.   2. Cyst on right leg - Located on medial thigh, been present for several month, it has minimal foul smelling drainage that is concerning to the patient, she has treated it daily with mupirocin - given by another provider for folliculitis; she denies redness, swelling, tenderness and tactile warmth of the nodule   Review of Systems Positive for lower back pain Negative in HPI    Objective:   Physical Exam BP 135/85  Pulse 99  Ht 5\' 7"  (1.702 m)  Wt 212 lb (96.163 kg)  BMI 33.20 kg/m2 Gen: 61 age AAF, NAD, flat affect, non ill appearing HEENT: normal facies, no labial edema or peri-oral lesion, OP clear and moist, uvula midline Skin: 1 cm x 0.5 cm epidermoid cyst on right proximal medial thigh with minimal bloody drainage, no calor or rubor     Assessment & Plan:  61 year old F with aytpical symptoms when starting Lipitor 80 mg. She also has a stable epidermoid cyst.

## 2012-04-24 NOTE — Assessment & Plan Note (Signed)
Patient assured that this was not an abscess and did not require intervention unless she wanted it. She refused. Also, she was instructed to stop using mupirocin, as this would not be effective for a cyst.

## 2012-04-24 NOTE — Assessment & Plan Note (Signed)
Patient asked for increased level of pain med since she felt other was not working. I refused since no time to address problem today.

## 2012-05-30 ENCOUNTER — Telehealth: Payer: Self-pay | Admitting: Family Medicine

## 2012-05-30 NOTE — Telephone Encounter (Signed)
Called pharmacy and spoke with Teashia. She reports, that after 06-25-12 they will only have Vicodin 5/325 (not the 5/500) Fwd. To PCP for info. Lorenda Hatchet, Renato Battles

## 2012-05-30 NOTE — Telephone Encounter (Signed)
Patient called [facility] at [address] has told the patient they are discontinuing the Generic Vicodin so she is going to need an Rx for a different pain medication.  She still has some of the other medicine left, but she wanted to let him know so that she won't be without.

## 2012-07-10 ENCOUNTER — Encounter: Payer: Self-pay | Admitting: Family Medicine

## 2012-07-10 ENCOUNTER — Ambulatory Visit (INDEPENDENT_AMBULATORY_CARE_PROVIDER_SITE_OTHER): Payer: Medicare Other | Admitting: Family Medicine

## 2012-07-10 VITALS — BP 125/83 | HR 88 | Ht 67.0 in | Wt 214.5 lb

## 2012-07-10 DIAGNOSIS — M25569 Pain in unspecified knee: Secondary | ICD-10-CM

## 2012-07-10 DIAGNOSIS — M48061 Spinal stenosis, lumbar region without neurogenic claudication: Secondary | ICD-10-CM

## 2012-07-10 DIAGNOSIS — M545 Low back pain: Secondary | ICD-10-CM

## 2012-07-10 DIAGNOSIS — M25561 Pain in right knee: Secondary | ICD-10-CM

## 2012-07-10 MED ORDER — HYDROCODONE-ACETAMINOPHEN 5-325 MG PO TABS: 1.0000 | ORAL_TABLET | Freq: Four times a day (QID) | ORAL | Status: DC | PRN

## 2012-07-10 NOTE — Progress Notes (Signed)
  Subjective:    Patient ID: Jenna Wong, female    DOB: January 14, 1951, 62 y.o.   MRN: [id number]  HPI   1. 2. Back Pain: Location: lower back Duration: years Quality: 5/10 Current Functional Status:  ADL's - no problems Preceding Events: no falls. no trauma Alleviating Factors: flexeril PRN (almost daily), vicodin PRN( 3-4 x a week) Exacerbating Factors: exertion Hx of intervention: no surgery but multiple offers from neurosurgery, Has done PT in past; Had injections in past  Hx of imaging: MRI 2009 - Multilevel lumbar degenerative changes as described in detail above Small to moderate central disc protrusion at L3-4 with mild spinal stenosis. There is also some lateral disc protrusion on the left at L2-3 and L3-4. Disc degeneration and spondylosis at L4-5 and L5-S1.  Red Flags: no weakness, no numbness and tingling, no impaired bowel or bladder function  2. Bilateral knee pain: Duration - started one month ago No swelling of knees,  Alleviated with heating  Exacerbated at night, and going up stairs No falls, no surgeries , no hx of injections,    Review of Systems Negative unless stated above    Objective:   Physical Exam  BP 125/83  Pulse 88  Ht 5\' 7"  (1.702 m)  Wt 214 lb 8 oz (97.297 kg)  BMI 33.60 kg/m2 Gen: AAM, non ill appearing, very flat affect, unpleasant MSK: 5/5 strength of lower extremities bilaterally, 2+ patellar DTR Knees: Patient refused to change out of long pants for exam > no effusion palpable thought pants > no crepitus > full flexion and extension > Negative grind and lachman's tests     Assessment & Plan:  62 year old F with chronic lower back pain on daily opioid treatment and new onset knee pain consistent with osteoarthritis.

## 2012-07-14 DIAGNOSIS — M25561 Pain in right knee: Secondary | ICD-10-CM | POA: Insufficient documentation

## 2012-07-14 NOTE — Assessment & Plan Note (Signed)
Cont Norco PRN. No refills before 10/08/12.

## 2012-07-14 NOTE — Assessment & Plan Note (Signed)
Unclear etiology. Likely osteoarthritis, but no imaging to confirm. No historical or PE evidence of ligament or meniscus injury. Patient offered steroid injections, but refused. Told to use anti-inflammatory meds PRN.

## 2012-09-02 ENCOUNTER — Telehealth: Payer: Self-pay | Admitting: Family Medicine

## 2012-09-02 NOTE — Telephone Encounter (Signed)
Patient is out of refills on her Flexeril and Zoloft and needs a new Rx sent to [facility] on [address].

## 2012-09-02 NOTE — Telephone Encounter (Signed)
Called pt and left message to call back. (please tell pt to call her pharmacy for refills and I did send her request to her PCP) .[name]

## 2012-09-15 ENCOUNTER — Telehealth: Payer: Self-pay | Admitting: Family Medicine

## 2012-09-15 DIAGNOSIS — G8929 Other chronic pain: Secondary | ICD-10-CM

## 2012-09-15 DIAGNOSIS — F329 Major depressive disorder, single episode, unspecified: Secondary | ICD-10-CM

## 2012-09-15 MED ORDER — CYCLOBENZAPRINE HCL 10 MG PO TABS: 10.0000 mg | ORAL_TABLET | Freq: Three times a day (TID) | ORAL | Status: DC | PRN

## 2012-09-15 MED ORDER — SERTRALINE HCL 50 MG PO TABS: 50.0000 mg | ORAL_TABLET | Freq: Every day | ORAL | Status: DC

## 2012-09-15 NOTE — Telephone Encounter (Signed)
Will forward to Dr Williamson 

## 2012-09-15 NOTE — Telephone Encounter (Signed)
Pt states that the pharmacy has told her that she is the one to call to get refill for her Zoloft and Flexeril - she has been out a while.  [facility] [address]

## 2012-09-15 NOTE — Telephone Encounter (Signed)
Refills have been sent. Please let patient know.

## 2012-11-25 ENCOUNTER — Other Ambulatory Visit: Payer: Self-pay

## 2012-11-25 DIAGNOSIS — Z1231 Encounter for screening mammogram for malignant neoplasm of breast: Secondary | ICD-10-CM

## 2012-12-24 ENCOUNTER — Ambulatory Visit
Admission: RE | Admit: 2012-12-24 | Discharge: 2012-12-24 | Disposition: A | Discharge status: Home / Self Care | Payer: Medicare Other | Source: Ambulatory Visit

## 2012-12-24 DIAGNOSIS — Z1231 Encounter for screening mammogram for malignant neoplasm of breast: Secondary | ICD-10-CM

## 2013-01-14 ENCOUNTER — Other Ambulatory Visit: Payer: Self-pay | Admitting: *Deleted

## 2013-01-14 DIAGNOSIS — M48061 Spinal stenosis, lumbar region without neurogenic claudication: Secondary | ICD-10-CM

## 2013-01-19 ENCOUNTER — Emergency Department (HOSPITAL_COMMUNITY)
Admission: EM | Admit: 2013-01-19 | Discharge: 2013-01-20 | Disposition: A | Discharge status: Home / Self Care | Payer: Medicare Other | Attending: Emergency Medicine | Admitting: Emergency Medicine

## 2013-01-19 ENCOUNTER — Other Ambulatory Visit: Payer: Self-pay | Admitting: *Deleted

## 2013-01-19 ENCOUNTER — Encounter (HOSPITAL_COMMUNITY): Payer: Self-pay | Admitting: Cardiology

## 2013-01-19 DIAGNOSIS — R519 Headache, unspecified: Secondary | ICD-10-CM

## 2013-01-19 DIAGNOSIS — F17200 Nicotine dependence, unspecified, uncomplicated: Secondary | ICD-10-CM | POA: Insufficient documentation

## 2013-01-19 DIAGNOSIS — R509 Fever, unspecified: Secondary | ICD-10-CM

## 2013-01-19 DIAGNOSIS — M48061 Spinal stenosis, lumbar region without neurogenic claudication: Secondary | ICD-10-CM

## 2013-01-19 DIAGNOSIS — Z79899 Other long term (current) drug therapy: Secondary | ICD-10-CM | POA: Insufficient documentation

## 2013-01-19 DIAGNOSIS — J029 Acute pharyngitis, unspecified: Secondary | ICD-10-CM

## 2013-01-19 DIAGNOSIS — L0231 Cutaneous abscess of buttock: Secondary | ICD-10-CM

## 2013-01-19 DIAGNOSIS — F79 Unspecified intellectual disabilities: Secondary | ICD-10-CM | POA: Insufficient documentation

## 2013-01-19 LAB — CBC WITH DIFFERENTIAL/PLATELET
Eosinophils Relative: 0 % (ref 0–5)
Lymphocytes Relative: 20 % (ref 12–46)
Lymphs Abs: 3.1 10*3/uL (ref 0.7–4.0)
MCHC: 36.2 g/dL — ABNORMAL HIGH (ref 30.0–36.0)
Monocytes Absolute: 1.2 10*3/uL — ABNORMAL HIGH (ref 0.1–1.0)
Monocytes Relative: 8 % (ref 3–12)
Platelets: 227 10*3/uL (ref 150–400)

## 2013-01-19 LAB — COMPREHENSIVE METABOLIC PANEL
ALT: 21 U/L (ref 0–35)
BUN: 9 mg/dL (ref 6–23)
CO2: 27 mEq/L (ref 19–32)
Calcium: 9.6 mg/dL (ref 8.4–10.5)
GFR calc Af Amer: 69 mL/min — ABNORMAL LOW (ref >90)
GFR calc non Af Amer: 60 mL/min — ABNORMAL LOW (ref >90)
Glucose, Bld: 122 mg/dL — ABNORMAL HIGH (ref 70–99)
Sodium: 136 mEq/L (ref 135–145)

## 2013-01-19 LAB — CG4 I-STAT (LACTIC ACID): Lactic Acid, Venous: 1.06 mmol/L (ref 0.5–2.2)

## 2013-01-19 MED ORDER — PROCHLORPERAZINE EDISYLATE 5 MG/ML IJ SOLN
10.0000 mg | Freq: Once | INTRAMUSCULAR | Status: AC
  Administered 2013-01-19: 10 mg via INTRAVENOUS
  Filled 2013-01-19: qty 2

## 2013-01-19 MED ORDER — SODIUM CHLORIDE 0.9 % IV BOLUS (SEPSIS)
1000.0000 mL | Freq: Once | INTRAVENOUS | Status: AC
  Administered 2013-01-19: 1000 mL via INTRAVENOUS

## 2013-01-19 MED ORDER — KETOROLAC TROMETHAMINE 30 MG/ML IJ SOLN
30.0000 mg | Freq: Once | INTRAMUSCULAR | Status: AC
  Administered 2013-01-19: 30 mg via INTRAVENOUS
  Filled 2013-01-19: qty 1

## 2013-01-19 MED ORDER — DIPHENHYDRAMINE HCL 50 MG/ML IJ SOLN
25.0000 mg | Freq: Once | INTRAMUSCULAR | Status: AC
  Administered 2013-01-19: 25 mg via INTRAVENOUS
  Filled 2013-01-19: qty 1

## 2013-01-19 NOTE — ED Provider Notes (Signed)
CSN: [id number]     Arrival date & time 01/19/13  1731 History     First MD Initiated Contact with Patient 01/19/13 2054     Chief Complaint  Patient presents with  . Headache  . Fever   (Consider location/radiation/quality/duration/timing/severity/associated sxs/prior Treatment) HPI Comments: Pt w/ PMHx of frequent skin abscesses now w/ buttock abscess, fever, HA and sore throat. States several days ago noted right gluteal abscess - began auto draining. Yesterday evening developed fever w/ Tmax 102.5. Subsequently developed gradual onset global HA and sore throat. Denies rhinorrhea, congestion, cough, dyspnea, chest pain. No abd pain, no n/v/d/c. No dysuria/polyuria/hematuria. No vaginal bleeding or discharge. No neck stiffness. No recent travel or sick contact. No exposure to tic. States generalized malaise and myalgia.   Patient is a 61 y.o. female presenting with general illness. The history is provided by the patient. No language interpreter was used.  Illness Location:  General Quality:  Fever, headache, sore throat, abscess Severity:  Moderate Onset quality:  Gradual Duration:  2 days Timing:  Constant Progression:  Worsening Chronicity:  New Associated symptoms: fatigue, fever, headaches and sore throat   Associated symptoms: no abdominal pain, no chest pain, no congestion, no cough, no diarrhea, no nausea, no rash, no shortness of breath and no vomiting     Past Medical History  Diagnosis Date  . Abnormal mammogram 12/00    repeat normal  . Abscess breast 7/80  . Spinal stenosis     MRI LS spine 9/09  . Cervical disc displacement     diagnosed by MRI--sees dr Mikal Plane, evaluation by disability determination services for permanent disability 04/25/00  . 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 SPINAL STENOSIS, LUMBAR 06/22/2008  . Breast abscess of female 07/18/2011   Past Surgical History  Procedure Laterality Date  . Abdominal hysterectomy     . Myomectomy     Family History  Problem Relation Age of Onset  . Hypertension Mother   . Hypertension Father   . Depression Sister   . Diabetes Sister   . Stroke Sister   . Parkinsonism Brother   . Dementia Brother    History  Substance Use Topics  . Smoking status: Current Every Day Smoker -- 1.00 packs/day    Types: Cigarettes  . Smokeless tobacco: Never Used     Comment: thinking about it  . Alcohol Use: Yes     Comment: 1 x per month   OB History   Grav Para Term Preterm Abortions TAB SAB Ect Mult Living                 Review of Systems  Constitutional: Positive for fever, chills and fatigue.  HENT: Positive for sore throat. Negative for congestion.   Respiratory: Negative for cough and shortness of breath.   Cardiovascular: Negative for chest pain and leg swelling.  Gastrointestinal: Negative for nausea, vomiting, abdominal pain, diarrhea and constipation.  Genitourinary: Negative for dysuria and frequency.  Skin: Positive for wound. Negative for color change and rash.  Neurological: Positive for weakness and headaches. Negative for dizziness.  Psychiatric/Behavioral: Negative for confusion and agitation.  All other systems reviewed and are negative.    Allergies  Review of patient's allergies indicates no known allergies.  Home Medications   Current Outpatient Rx  Name  Route  Sig  Dispense  Refill  . acetaminophen (TYLENOL) 500 MG tablet   Oral   Take 500 mg by  mouth every 4 (four) hours as needed for pain or fever.         Marland Kitchen atorvastatin (LIPITOR) 80 MG tablet   Oral   Take 1 tablet (80 mg total) by mouth daily.   90 tablet   3   . cyclobenzaprine (FLEXERIL) 10 MG tablet   Oral   Take 1 tablet (10 mg total) by mouth 3 (three) times daily as needed.   30 tablet   12   . sertraline (ZOLOFT) 50 MG tablet   Oral   Take 1 tablet (50 mg total) by mouth daily.   90 tablet   3    BP 146/90  Pulse 128  Temp(Src) 97.8 F (36.6 C) (Oral)   Resp 16  SpO2 96% Physical Exam  Vitals reviewed. Constitutional: She is oriented to person, place, and time. She appears well-developed and well-nourished. No distress.  HENT:  Head: Normocephalic and atraumatic.  Mouth/Throat: Uvula is midline, oropharynx is clear and moist and mucous membranes are normal.  Eyes: EOM are normal. Pupils are equal, round, and reactive to light.  Neck: Normal range of motion. Neck supple. No spinous process tenderness and no muscular tenderness present. No Brudzinski's sign noted.  Cardiovascular: Regular rhythm.  Tachycardia present.   Pulmonary/Chest: Effort normal and breath sounds normal. No respiratory distress.  Abdominal: Soft. She exhibits no distension. There is no tenderness.  Musculoskeletal: Normal range of motion. She exhibits no edema.  Neurological: She is alert and oriented to person, place, and time. She has normal strength. No cranial nerve deficit or sensory deficit. GCS eye subscore is 4. GCS verbal subscore is 5. GCS motor subscore is 6.  Skin: Skin is warm and dry. No rash noted.  Psychiatric: She has a normal mood and affect. Her behavior is normal.    ED Course   Procedures (including critical care time)  Results for orders placed during the hospital encounter of 01/19/13  RAPID STREP SCREEN      Result Value Range   Streptococcus, Group A Screen (Direct) NEGATIVE  NEGATIVE  CBC WITH DIFFERENTIAL      Result Value Range   WBC 15.6 (*) 4.0 - 10.5 K/uL   RBC 4.67  3.87 - 5.11 MIL/uL   Hemoglobin 15.6 (*) 12.0 - 15.0 g/dL   HCT 16.1  09.6 - 04.5 %   MCV 92.3  78.0 - 100.0 fL   MCH 33.4  26.0 - 34.0 pg   MCHC 36.2 (*) 30.0 - 36.0 g/dL   RDW 40.9  81.1 - 91.4 %   Platelets 227  150 - 400 K/uL   Neutrophils Relative % 72  43 - 77 %   Neutro Abs 11.2 (*) 1.7 - 7.7 K/uL   Lymphocytes Relative 20  12 - 46 %   Lymphs Abs 3.1  0.7 - 4.0 K/uL   Monocytes Relative 8  3 - 12 %   Monocytes Absolute 1.2 (*) 0.1 - 1.0 K/uL    Eosinophils Relative 0  0 - 5 %   Eosinophils Absolute 0.1  0.0 - 0.7 K/uL   Basophils Relative 0  0 - 1 %   Basophils Absolute 0.0  0.0 - 0.1 K/uL  COMPREHENSIVE METABOLIC PANEL      Result Value Range   Sodium 136  135 - 145 mEq/L   Potassium 4.5  3.5 - 5.1 mEq/L   Chloride 99  96 - 112 mEq/L   CO2 27  19 - 32 mEq/L   Glucose,  Bld 122 (*) 70 - 99 mg/dL   BUN 9  6 - 23 mg/dL   Creatinine, Ser 1.61  0.50 - 1.10 mg/dL   Calcium 9.6  8.4 - 09.6 mg/dL   Total Protein 7.7  6.0 - 8.3 g/dL   Albumin 4.0  3.5 - 5.2 g/dL   AST 22  0 - 37 U/L   ALT 21  0 - 35 U/L   Alkaline Phosphatase 65  39 - 117 U/L   Total Bilirubin 0.8  0.3 - 1.2 mg/dL   GFR calc non Af Amer 60 (*) >90 mL/min   GFR calc Af Amer 69 (*) >90 mL/min  URINALYSIS, ROUTINE W REFLEX MICROSCOPIC      Result Value Range   Color, Urine YELLOW  YELLOW   APPearance CLEAR  CLEAR   Specific Gravity, Urine 1.015  1.005 - 1.030   pH 7.0  5.0 - 8.0   Glucose, UA NEGATIVE  NEGATIVE mg/dL   Hgb urine dipstick LARGE (*) NEGATIVE   Bilirubin Urine NEGATIVE  NEGATIVE   Ketones, ur NEGATIVE  NEGATIVE mg/dL   Protein, ur NEGATIVE  NEGATIVE mg/dL   Urobilinogen, UA 2.0 (*) 0.0 - 1.0 mg/dL   Nitrite NEGATIVE  NEGATIVE   Leukocytes, UA NEGATIVE  NEGATIVE  URINE MICROSCOPIC-ADD ON      Result Value Range   Squamous Epithelial / LPF RARE  RARE   RBC / HPF 21-50  <3 RBC/hpf   Urine-Other MUCOUS PRESENT    CG4 I-STAT (LACTIC ACID)      Result Value Range   Lactic Acid, Venous 1.06  0.5 - 2.2 mmol/L    No results found. No diagnosis found.  MDM  Exam as above, NAD, tachycardic, afebrile, normotensive, no resp distress or hypoxia, no focal neuro deficit, no clinical meningismus. No rash, lungs clear, abd soft and benign. Buttock abscess small <1cm - indurated, not fluctuant, no erythema or warmth. Strep test negative, lactic acid normal, WBC 15k w/ left shift, CMP unremarkable, given IVF and migraine cocktail w/ resolution of HA. U/a  negative for infection.  Blood cultures obtained. At this time doubt meningitis/encephalitis. Possible RMSF vs viral illness. Doubt pneumonia - no resp sx. Stable for d/c home. Given strict return precautions. fup w/ pcp in 2 days. D/c home in good and stable condition. Given rx for doxy to cover for possible RMSF  I have personally reviewed labs and considered in my MDM. Case d/w Dr Blinda Leatherwood  1. Fever   2. Headache   3. Abscess of buttock, right   4. Sore throat    New Prescriptions   DOXYCYCLINE (VIBRAMYCIN) 100 MG CAPSULE    Take 1 capsule (100 mg total) by mouth 2 (two) times daily. One po bid x 10 days   HYDROCODONE-ACETAMINOPHEN (NORCO) 5-325 MG PER TABLET    Take 1 tablet by mouth every 4 (four) hours as needed for pain.   Garnetta Buddy, MD [address] [place] [address] [phone]  Schedule an appointment as soon as possible for a visit on 01/22/2013     Audelia Hives, MD 01/20/13 [phone]

## 2013-01-19 NOTE — ED Notes (Addendum)
Pt reports headache and fever that started yesterday. States at that she has been taking tylenol at home to help with the pain and fever. Reports fever asa high as 102 for the past 2 days. Reports that she had an abscess on her buttocks that bust on Sunday. States that she started feeling bad afterwards.

## 2013-01-19 NOTE — ED Notes (Signed)
Pt reports reports chills since yesterday along with generalized weakness and "not feeling well." PT reports today that she started experiencing 10/10 HA. Denies blurred vision, lightheadedness, and dizziness. Neuro intact.

## 2013-01-20 LAB — URINALYSIS, ROUTINE W REFLEX MICROSCOPIC
Glucose, UA: NEGATIVE mg/dL
Leukocytes, UA: NEGATIVE
pH: 7 (ref 5.0–8.0)

## 2013-01-20 LAB — URINE MICROSCOPIC-ADD ON

## 2013-01-20 MED ORDER — DOXYCYCLINE HYCLATE 100 MG PO CAPS: 100.0000 mg | ORAL_CAPSULE | Freq: Two times a day (BID) | ORAL | Status: DC

## 2013-01-20 MED ORDER — HYDROCODONE-ACETAMINOPHEN 5-325 MG PO TABS: 1.0000 | ORAL_TABLET | ORAL | Status: DC | PRN

## 2013-01-20 NOTE — Telephone Encounter (Signed)
Must be refilled by PCP. On Vacation this week

## 2013-01-21 LAB — CULTURE, GROUP A STREP

## 2013-01-21 NOTE — ED Provider Notes (Signed)
I saw and evaluated the patient, reviewed the resident's note and I agree with the findings and plan.  Patient with skin abscess, fever and URI symptoms. Exam reveals fairly small abscess not felt to be source of fever. Empiric treatment with doxy to cover skin and RMSF.  Gilda Crease, MD 01/21/13 [phone]

## 2013-01-22 NOTE — Telephone Encounter (Signed)
Will fwd to PCP, Dr. Clinton Sawyer.  Jenna Wong, Darlyne Russian, CMA

## 2013-01-26 LAB — CULTURE, BLOOD (ROUTINE X 2): Culture: NO GROWTH

## 2013-01-26 NOTE — Telephone Encounter (Signed)
Opiate pain medications are never refilled without a visit with a physician.   

## 2013-01-26 NOTE — Telephone Encounter (Signed)
Opiate pain medications are never refilled without a visit with a physician.

## 2013-02-02 ENCOUNTER — Ambulatory Visit: Payer: Medicare Other | Admitting: Family Medicine

## 2013-02-16 ENCOUNTER — Encounter: Payer: Self-pay | Admitting: Family Medicine

## 2013-02-16 ENCOUNTER — Ambulatory Visit (INDEPENDENT_AMBULATORY_CARE_PROVIDER_SITE_OTHER): Payer: Medicare Other | Admitting: Family Medicine

## 2013-02-16 VITALS — BP 130/85 | HR 83 | Ht 67.0 in | Wt 213.0 lb

## 2013-02-16 DIAGNOSIS — M549 Dorsalgia, unspecified: Secondary | ICD-10-CM

## 2013-02-16 DIAGNOSIS — F3289 Other specified depressive episodes: Secondary | ICD-10-CM

## 2013-02-16 DIAGNOSIS — M545 Low back pain, unspecified: Secondary | ICD-10-CM

## 2013-02-16 DIAGNOSIS — G8929 Other chronic pain: Secondary | ICD-10-CM

## 2013-02-16 DIAGNOSIS — F329 Major depressive disorder, single episode, unspecified: Secondary | ICD-10-CM

## 2013-02-16 MED ORDER — HYDROCODONE-ACETAMINOPHEN 5-325 MG PO TABS: 1.0000 | ORAL_TABLET | ORAL | Status: DC | PRN

## 2013-02-16 MED ORDER — SERTRALINE HCL 50 MG PO TABS: 100.0000 mg | ORAL_TABLET | Freq: Every day | ORAL | Status: DC

## 2013-02-16 NOTE — Patient Instructions (Addendum)
Dear Ms. Bryner,   It was good to see you today. I appreciate your patience.   Regarding anxiety and your mood, please increase the sertraline to 100 mg per day. I think this will help with your frustation.   For your back pain, please use the Norco as needed.   Come back in 3-4 weeks and make your appointment for first thing in the morning or first in the afternoon.   Take Care,   Dr. Clinton Sawyer  Narcotic Guidelines:   1. You cannot get an early refill, even it is lost.   2. You cannot get pain medications from any other doctor, unless it is the emergency department and related to a new problem or injury.  3. You cannot use alcohol, marijuana, cocaine or any other recreational drugs while using this medication. This is very dangerous.  4. You are willing to have your urine drug tested at each visit.  5. You will not drive while using this medication, because that can endanger you and your family.  6. If any medication is stolen, then there must be a police report to verify it, or it cannot be refilled.  7. I will not prescribe these medications for longer than 3 months.  8. You must bring your pill bottle to each visit.  9. You must use the same pharmacy for all refills for the medication, unless you clear it with me beforehand. 10. You cannot share or sell this medication.  11. Always take a stool softener to prevent constipation.

## 2013-02-16 NOTE — Progress Notes (Signed)
  Subjective:    Patient ID: Jenna Wong, female    DOB: 1950/11/02, 62 y.o.   MRN: [id number]  HPI  62 year old F with chronic lower back pain.   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 There is also some lateral disc protrusion on the left at L2-3 and L3-4. Disc degeneration and spondylosis at L4-5 and L5-S1.  Red Flags: no weakness, no numbness and tingling, no impaired bowel or bladder function  Fever and recent treatment in the ED:  - See on 01/19/13 in ED and had fever and chills given empiric coverage for RMSF with doxycycline - She completed the course of antibiotics and denies any new fever or chills  Anxiety/Frustation: Patient feels like her general level of anxiety has been higher recently, she does not recognize an specific inciting event, it has manifested itself through restlessness at home and shorter patience/increased frustration with people and events for example she was very frustrated while waiting to see me today, but recognizes that this is not typical for her; Still currently using taking Sertraline 50 mg daily   Review of Systems Positive for anxiety, frustration, fatigue, and restlessness    Objective:   Physical Exam BP 130/85  Pulse 83  Ht 5\' 7"  (1.702 m)  Wt 213 lb (96.616 kg)  BMI 33.35 kg/m2  Gen: AAM, non ill appearing, very flat affect, conversant   MSK: slow to rise  from seated position and uses both hands to brace while getting onto the exam table, 5/5 strength of lower extremities bilaterally, 2+ patellar DTR, negative straight leg raise test Psych: flat affect consistent with previous visits, normal thought content and speech pattern       Assessment & Plan:  Febrile Illness Resolved - Likely viral infection, no need for follow up

## 2013-02-17 NOTE — Assessment & Plan Note (Signed)
Assessment: Subjectively worsening symptoms, with no clear preceding event Plan: Increase sertraline to 100 mg daily

## 2013-02-17 NOTE — Assessment & Plan Note (Signed)
Assessment: stable chronic low back pain from degenerative lumbar changes, well controlled with Norco 5-325 mg PO q 4 PRN; Plan: cont Norco, given prescription for #90 with no refills; given instruction about narcotics and specifically told to bring the meds to next visit; pt voiced understanding

## 2013-02-22 ENCOUNTER — Emergency Department (HOSPITAL_COMMUNITY)
Admission: EM | Admit: 2013-02-22 | Discharge: 2013-02-22 | Disposition: A | Discharge status: Home / Self Care | Payer: Medicare Other | Attending: Emergency Medicine | Admitting: Emergency Medicine

## 2013-02-22 ENCOUNTER — Encounter (HOSPITAL_COMMUNITY): Payer: Self-pay | Admitting: Emergency Medicine

## 2013-02-22 DIAGNOSIS — Z8739 Personal history of other diseases of the musculoskeletal system and connective tissue: Secondary | ICD-10-CM | POA: Insufficient documentation

## 2013-02-22 DIAGNOSIS — F17200 Nicotine dependence, unspecified, uncomplicated: Secondary | ICD-10-CM | POA: Insufficient documentation

## 2013-02-22 DIAGNOSIS — Z79899 Other long term (current) drug therapy: Secondary | ICD-10-CM | POA: Insufficient documentation

## 2013-02-22 DIAGNOSIS — N764 Abscess of vulva: Secondary | ICD-10-CM

## 2013-02-22 DIAGNOSIS — F819 Developmental disorder of scholastic skills, unspecified: Secondary | ICD-10-CM | POA: Insufficient documentation

## 2013-02-22 DIAGNOSIS — Z8742 Personal history of other diseases of the female genital tract: Secondary | ICD-10-CM | POA: Insufficient documentation

## 2013-02-22 MED ORDER — HYDROCODONE-ACETAMINOPHEN 5-325 MG PO TABS: ORAL_TABLET | ORAL | Status: DC

## 2013-02-22 MED ORDER — SULFAMETHOXAZOLE-TRIMETHOPRIM 800-160 MG PO TABS
1.0000 | ORAL_TABLET | Freq: Two times a day (BID) | ORAL | Status: DC
Start: 2013-02-22 — End: 2013-03-12

## 2013-02-22 NOTE — ED Notes (Signed)
Pt states 2 days ago she began having pain and swelling in left labia.  Pt has edema and some redness to left labia.  Pt denies nausea, vomiting and fever.  Pt rates pain 8/10 currently.

## 2013-02-22 NOTE — ED Notes (Signed)
Pt reports having an abcsess on the left side of her groin. Pt reports that the area appeared two days ago and has resulted in 10/10 pain. Pt denies vaginal bleeding or discharge.  Pt is A/O x4 and in NAD.

## 2013-02-22 NOTE — ED Provider Notes (Signed)
Medical screening examination/treatment/procedure(s) were performed by non-physician practitioner and as supervising physician I was immediately available for consultation/collaboration.   Gavin Pound. Kimimila Tauzin, MD 02/22/13 1345

## 2013-02-22 NOTE — ED Provider Notes (Signed)
CSN: [id number]     Arrival date & time 02/22/13  1152 History   First MD Initiated Contact with Patient 02/22/13 1203     Chief Complaint  Patient presents with  . Abscess   (Consider location/radiation/quality/duration/timing/severity/associated sxs/prior Treatment) Patient is a 62 y.o. female presenting with abscess. The history is provided by the patient.  Abscess Location:  Ano-genital Ano-genital abscess location:  Vagina (L labia) Size:  5cm x 1cm Abscess quality: fluctuance, induration, painful and warmth   Abscess quality: not draining   Red streaking: no   Duration:  2 days Progression:  Worsening Pain details:    Quality:  Aching   Severity:  Moderate   Timing:  Constant   Progression:  Worsening Chronicity:  Recurrent Relieved by:  Nothing Worsened by:  Draining/squeezing Ineffective treatments:  Warm compresses and warm water soaks Associated symptoms: no fever, no nausea and no vomiting     Past Medical History  Diagnosis Date  . Abnormal mammogram 12/00    repeat normal  . Abscess breast 7/80  . Spinal stenosis     MRI LS spine 9/09  . Cervical disc displacement     diagnosed by MRI--sees dr Mikal Plane, evaluation by disability determination services for permanent disability 04/25/00  . 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 SPINAL STENOSIS, LUMBAR 06/22/2008  . Breast abscess of female 07/18/2011   Past Surgical History  Procedure Laterality Date  . Abdominal hysterectomy    . Myomectomy     Family History  Problem Relation Age of Onset  . Hypertension Mother   . Hypertension Father   . Depression Sister   . Diabetes Sister   . Stroke Sister   . Parkinsonism Brother   . Dementia Brother    History  Substance Use Topics  . Smoking status: Current Every Day Smoker -- 1.00 packs/day for 30 years    Types: Cigarettes  . Smokeless tobacco: Never Used     Comment: thinking about it  . Alcohol Use: Yes     Comment: 1 x per  month   OB History   Grav Para Term Preterm Abortions TAB SAB Ect Mult Living                 Review of Systems  Constitutional: Negative for fever.  Gastrointestinal: Negative for nausea and vomiting.  Skin: Negative for color change.       Positive for abscess.  Hematological: Negative for adenopathy.    Allergies  Review of patient's allergies indicates no known allergies.  Home Medications   Current Outpatient Rx  Name  Route  Sig  Dispense  Refill  . atorvastatin (LIPITOR) 80 MG tablet   Oral   Take 1 tablet (80 mg total) by mouth daily.   90 tablet   3   . cyclobenzaprine (FLEXERIL) 10 MG tablet   Oral   Take 10 mg by mouth 3 (three) times daily as needed for muscle spasms.         Marland Kitchen HYDROcodone-acetaminophen (NORCO) 5-325 MG per tablet   Oral   Take 1 tablet by mouth every 4 (four) hours as needed for pain.   90 tablet   0   . sertraline (ZOLOFT) 50 MG tablet   Oral   Take 2 tablets (100 mg total) by mouth daily.   90 tablet   3    BP 116/75  Pulse 96  Temp(Src) 99.1 F (37.3 C) (Oral)  Resp 18  SpO2 99% Physical Exam  Nursing note and vitals reviewed. Constitutional: She appears well-developed and well-nourished.  HENT:  Head: Normocephalic and atraumatic.  Eyes: Conjunctivae are normal.  Neck: Normal range of motion. Neck supple.  Pulmonary/Chest: No respiratory distress.  Genitourinary:     Neurological: She is alert.  Skin: Skin is warm and dry.  Psychiatric: She has a normal mood and affect.    ED Course  Procedures (including critical care time) Labs Review Labs Reviewed - No data to display Imaging Review No results found.  12:42 PM Patient seen and examined. Work-up initiated. Will attempt I&D.    Vital signs reviewed and are as follows: Filed Vitals:   02/22/13 1156  BP: 116/75  Pulse: 96  Temp: 99.1 F (37.3 C)  Resp: 18   INCISION AND DRAINAGE Performed by: Carolee Rota Consent: Verbal consent  obtained. Risks and benefits: risks, benefits and alternatives were discussed Type: abscess  Body area: L vulva  Anesthesia: local infiltration  Needle aspiration was first performed with 18g needle and syringe and when return of pus was ensured, incision was made with a scalpel.  Local anesthetic: lidocaine 1%   Anesthetic total: 5 ml  Complexity: complex Blunt dissection to break up loculations  Drainage: purulent  Drainage amount: large  Packing material: none  Patient tolerance: Patient tolerated the procedure well with no immediate complications.  The patient was urged to return to the Emergency Department urgently with worsening pain, swelling, expanding erythema especially if it streaks away from the affected area, fever, or if they have any other concerns.   The patient was urged to return to the Emergency Department or go to their PCP in 48 hours for wound recheck if the area is not significantly improved.  Patient counseled on use of narcotic pain medications. Counseled not to combine these medications with others containing tylenol. Urged not to drink alcohol, drive, or perform any other activities that requires focus while taking these medications. The patient verbalizes understanding and agrees with the plan.  The patient verbalized understanding and stated agreement with this plan.   MDM   1. Labial abscess    Patient with skin abscess amenable to incision and drainage. Appearance not consistent with bartholin gland cyst. No signs of cellulitis is surrounding skin.  Will give abx given location and size. No systemic sx of illness. Will d/c to home.       Renne Crigler, PA-C 02/22/13 1344

## 2013-03-11 ENCOUNTER — Encounter: Payer: Self-pay | Admitting: Family Medicine

## 2013-03-11 ENCOUNTER — Ambulatory Visit (INDEPENDENT_AMBULATORY_CARE_PROVIDER_SITE_OTHER): Payer: Medicare Other | Admitting: Family Medicine

## 2013-03-11 VITALS — BP 130/80 | HR 80 | Temp 97.9°F | Ht 67.0 in | Wt 215.0 lb

## 2013-03-11 DIAGNOSIS — G8929 Other chronic pain: Secondary | ICD-10-CM

## 2013-03-11 DIAGNOSIS — M545 Low back pain, unspecified: Secondary | ICD-10-CM

## 2013-03-11 DIAGNOSIS — R7309 Other abnormal glucose: Secondary | ICD-10-CM

## 2013-03-11 DIAGNOSIS — F329 Major depressive disorder, single episode, unspecified: Secondary | ICD-10-CM

## 2013-03-11 DIAGNOSIS — Z23 Encounter for immunization: Secondary | ICD-10-CM

## 2013-03-11 DIAGNOSIS — M549 Dorsalgia, unspecified: Secondary | ICD-10-CM

## 2013-03-11 DIAGNOSIS — R7303 Prediabetes: Secondary | ICD-10-CM

## 2013-03-11 LAB — POCT GLYCOSYLATED HEMOGLOBIN (HGB A1C): Hemoglobin A1C: 6.1

## 2013-03-11 MED ORDER — HYDROCODONE-ACETAMINOPHEN 5-325 MG PO TABS: 1.0000 | ORAL_TABLET | Freq: Four times a day (QID) | ORAL | Status: DC | PRN

## 2013-03-11 NOTE — Patient Instructions (Addendum)
Dear Ms. Furber,   Thank you for coming to clinic today. Please read below regarding the issues that we discussed.   1. Pain - We will continue with the pain medication and flexeril. You can take both only if you are staying at home and not driving. Also, please continue to follow the same rules list below.   2. Mood - I am glad that you are feeling better and less frustrated. Continue with the Zoloft 100 mg daily.   3. Flu Shot - We will give you that today.  4. Pre-diabetes - I will check your blood sugar average for the last 3 months and let you know what it is.   Please follow up in clinic in 2 months. Please call earlier if you have any questions or concerns.   Sincerely,   Dr. Clinton Sawyer   Narcotic Guidelines:   1. You cannot get an early refill, even it is lost.   2. You cannot get pain medications from any other doctor, unless it is the emergency department and related to a new problem or injury.  3. You cannot use alcohol, marijuana, cocaine or any other recreational drugs while using this medication. This is very dangerous.  4. You are willing to have your urine drug tested at each visit.  5. You will not drive while using this medication, because that can endanger you and your family.  6. If any medication is stolen, then there must be a police report to verify it, or it cannot be refilled.  7. I will not prescribe these medications for longer than 3 months.  8. You must bring your pill bottle to each visit.  9. You must use the same pharmacy for all refills for the medication, unless you clear it with me beforehand. 10. You cannot share or sell this medication.  11. Always take a stool softener to prevent constipation.

## 2013-03-11 NOTE — Progress Notes (Signed)
  Subjective:    Patient ID: Jenna Wong, female    DOB: 04/27/1951, 62 y.o.   MRN: [id number]  HPI  62 year old female with history of depression and chronic low back pain who presents for followup.   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 There is also some lateral disc protrusion on the left at L2-3 and L3-4. Disc degeneration and spondylosis at L4-5 and L5-S1.  Red Flags: no weakness, no numbness and tingling, no impaired bowel or bladder function  Depression: Patient with worsening frustration and depression the last visit. Therefore her sertraline dose was increased to 100 mg daily. Today she reports that her mood has improved and level of frustration and anger have decreased. Her family has noticed this as well according to the patient. She denies any negative effects of increasing the dose of medication.    Review of Systems See HPI     Objective:   Physical Exam  BP 138/89  Pulse 80  Temp(Src) 97.9 F (36.6 C) (Oral)  Ht 5\' 7"  (1.702 m)  Wt 215 lb (97.523 kg)  BMI 33.67 kg/m2 Gen: middle aged AAF, non distressed, pleasant and conversant Back: no bony abnormalities,  Mild TTP of lumbar paraspinal muscles, negative SLR test bilaterally Psych:  more dynamic affect than previous visits and smiles multiple times which is atypical for her     Assessment & Plan:

## 2013-03-12 LAB — DRUG SCREEN, URINE
Benzodiazepines.: NEGATIVE
Methadone: NEGATIVE
Propoxyphene: NEGATIVE

## 2013-03-12 NOTE — Assessment & Plan Note (Signed)
Has not been check in > 1 year. Will obtain A1C today and have patient return to discuss blood sugar management.

## 2013-03-12 NOTE — Assessment & Plan Note (Signed)
Assessment: mood improved per patient and quite evident to me as well Plan: continue sertraline at 100 mg daily

## 2013-03-12 NOTE — Assessment & Plan Note (Signed)
Assessment: stable pain, controlled on flexeril and intermittent Norco Plan: continue current regimen and given rx for Norco 5-325 mg # 60 to be filled 04/08/13 if needed; obtain UDS today

## 2013-04-08 ENCOUNTER — Telehealth: Payer: Self-pay | Admitting: Family Medicine

## 2013-04-08 NOTE — Telephone Encounter (Signed)
Patient dropped off form to be filled out for social services.  Please call her when completed. °

## 2013-04-09 NOTE — Telephone Encounter (Signed)
Form placed in Dr. Yetta Numbers box to be filled out.  Jenna Wong, Jenna Wong, CMA

## 2013-04-10 ENCOUNTER — Ambulatory Visit (INDEPENDENT_AMBULATORY_CARE_PROVIDER_SITE_OTHER): Payer: Medicare Other | Admitting: *Deleted

## 2013-04-10 DIAGNOSIS — Z111 Encounter for screening for respiratory tuberculosis: Secondary | ICD-10-CM

## 2013-04-10 NOTE — Telephone Encounter (Signed)
Form completed and returned to CMA 

## 2013-04-10 NOTE — Progress Notes (Signed)
Tuberculin skin test applied to left ventral forearm. Explained how to read the test, measuring induration not just erythema; she will come into office in 48-72 hours to have test read. 

## 2013-04-10 NOTE — Telephone Encounter (Signed)
Form completed and placed up front.  Patient notified.   Rever Pichette, Darlyne Russian, CMA

## 2013-04-13 ENCOUNTER — Ambulatory Visit: Payer: Medicare Other | Admitting: *Deleted

## 2013-04-13 DIAGNOSIS — Z111 Encounter for screening for respiratory tuberculosis: Secondary | ICD-10-CM

## 2013-06-15 ENCOUNTER — Ambulatory Visit (INDEPENDENT_AMBULATORY_CARE_PROVIDER_SITE_OTHER): Payer: Medicare Other | Admitting: Family Medicine

## 2013-06-15 ENCOUNTER — Encounter: Payer: Self-pay | Admitting: Family Medicine

## 2013-06-15 VITALS — BP 123/85 | HR 85 | Temp 98.3°F | Wt 221.0 lb

## 2013-06-15 DIAGNOSIS — M79609 Pain in unspecified limb: Secondary | ICD-10-CM

## 2013-06-15 DIAGNOSIS — M79673 Pain in unspecified foot: Secondary | ICD-10-CM | POA: Insufficient documentation

## 2013-06-15 DIAGNOSIS — M79672 Pain in left foot: Secondary | ICD-10-CM

## 2013-06-15 MED ORDER — CEPHALEXIN 500 MG PO CAPS: 500.0000 mg | ORAL_CAPSULE | Freq: Two times a day (BID) | ORAL | Status: DC

## 2013-06-15 NOTE — Patient Instructions (Signed)
You likely have an infection of the foot or an allergic reaction to the red dye of the tatoo Please start the antibiotics for the infection. Please schedule a follow up with me in 1 week, if you are not better we may need to give you steroids or do a biopsy of the tatoo. Please try 600mg  of ibuprofen.

## 2013-06-15 NOTE — Assessment & Plan Note (Addendum)
Etiology unclear, concern for possible pseudolymphatous reaction due to red dye.  As painful and w/ recent purulent discharge will treat for possible cellulitis (precepted pt w/ Dr. Jennette Kettle) Keflex Ibuprofen 600mg  Q6 F/u in 1 wk and if not better will consider steroids vs biopsy

## 2013-06-15 NOTE — Progress Notes (Signed)
Jenna Wong is a 62 y.o. female who presents to [facility] today for L foot pain:  L foot pain: on dorsum of foot at location of tatoo. Started after 103 lb lab stepped on foot. Occurred in September. Pain is achy and is worse w/ pressure applied. Better when not walking and when resting. Denies swelling in joints, fevers. Purulent discharge out of edge of tatoo 2 wks ago that is now healing. Tatoo put on 10 years ago.   The following portions of the patient's history were reviewed and updated as appropriate: allergies, current medications, past medical history, family and social history, and problem list.  Patient is a smoker  Past Medical History  Diagnosis Date  . Abnormal mammogram 12/00    repeat normal  . Abscess breast 7/80  . Spinal stenosis     MRI LS spine 9/09  . Cervical disc displacement     diagnosed by MRI--sees dr Mikal Plane, evaluation by disability determination services for permanent disability 04/25/00  . 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 SPINAL STENOSIS, LUMBAR 06/22/2008  . Breast abscess of female 07/18/2011    ROS as above otherwise neg.    Medications reviewed. Current Outpatient Prescriptions  Medication Sig Dispense Refill  . atorvastatin (LIPITOR) 80 MG tablet Take 1 tablet (80 mg total) by mouth daily.  90 tablet  3  . cephALEXin (KEFLEX) 500 MG capsule Take 1 capsule (500 mg total) by mouth 2 (two) times daily.  20 capsule  0  . cyclobenzaprine (FLEXERIL) 10 MG tablet Take 10 mg by mouth 3 (three) times daily as needed for muscle spasms.      Marland Kitchen HYDROcodone-acetaminophen (NORCO) 5-325 MG per tablet Take 1 tablet by mouth every 6 (six) hours as needed for pain.  60 tablet  0  . sertraline (ZOLOFT) 50 MG tablet Take 2 tablets (100 mg total) by mouth daily.  90 tablet  3   No current facility-administered medications for this visit.    Exam:  BP 123/85  Pulse 85  Temp(Src) 98.3 F (36.8 C) (Oral)  Wt 221 lb (100.245 kg) Gen: Well  NAD HEENT: EOMI,  MMM SKin: Lateral L foot w/ butterfly tattoo w/ erythematous indurated skin within likes of butterfly wings were red dye is used, black dye areas and other tattoos w/o swelling.   No results found for this or any previous visit (from the past 72 hour(s)).  A/P (as seen in Problem list)  Foot pain Etiology unclear, concern for possible pseudolymphatous reaction due to red dye.  As painful and w/ recent purulent discharge will treat for possible cellulitis (precepted pt w/ Dr. Jennette Kettle) F/u in 1 wk and if not better will consider steroids vs biopsy

## 2013-06-22 ENCOUNTER — Ambulatory Visit (INDEPENDENT_AMBULATORY_CARE_PROVIDER_SITE_OTHER): Payer: Medicare Other | Admitting: Family Medicine

## 2013-06-22 ENCOUNTER — Encounter: Payer: Self-pay | Admitting: Family Medicine

## 2013-06-22 VITALS — BP 133/81 | HR 82 | Temp 98.8°F | Ht 67.0 in | Wt 216.0 lb

## 2013-06-22 DIAGNOSIS — M79672 Pain in left foot: Secondary | ICD-10-CM

## 2013-06-22 DIAGNOSIS — M79609 Pain in unspecified limb: Secondary | ICD-10-CM

## 2013-06-22 MED ORDER — PREDNISONE 50 MG PO TABS: ORAL_TABLET | ORAL | Status: DC

## 2013-06-22 NOTE — Progress Notes (Signed)
Jenna Wong is a 62 y.o. female who presents to [facility] today for L foot swelling  Pain and irritation better. No discharge. No change in overall redness and swelling. Still on Keflex and ibuprofen.   The following portions of the patient's history were reviewed and updated as appropriate: allergies, current medications, past medical history, family and social history, and problem list.   Past Medical History  Diagnosis Date  . Abnormal mammogram 12/00    repeat normal  . Abscess breast 7/80  . Spinal stenosis     MRI LS spine 9/09  . Cervical disc displacement     diagnosed by MRI--sees dr Mikal Plane, evaluation by disability determination services for permanent disability 04/25/00  . 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 SPINAL STENOSIS, LUMBAR 06/22/2008  . Breast abscess of female 07/18/2011    ROS as above otherwise neg.    Medications reviewed. Current Outpatient Prescriptions  Medication Sig Dispense Refill  . atorvastatin (LIPITOR) 80 MG tablet Take 1 tablet (80 mg total) by mouth daily.  90 tablet  3  . cephALEXin (KEFLEX) 500 MG capsule Take 1 capsule (500 mg total) by mouth 2 (two) times daily.  20 capsule  0  . cyclobenzaprine (FLEXERIL) 10 MG tablet Take 10 mg by mouth 3 (three) times daily as needed for muscle spasms.      Marland Kitchen HYDROcodone-acetaminophen (NORCO) 5-325 MG per tablet Take 1 tablet by mouth every 6 (six) hours as needed for pain.  60 tablet  0  . predniSONE (DELTASONE) 50 MG tablet Take daily with breakfast  10 tablet  0  . sertraline (ZOLOFT) 50 MG tablet Take 2 tablets (100 mg total) by mouth daily.  90 tablet  3   No current facility-administered medications for this visit.    Exam:  BP 133/81  Pulse 82  Temp(Src) 98.8 F (37.1 C) (Oral)  Ht 5\' 7"  (1.702 m)  Wt 216 lb (97.977 kg)  BMI 33.82 kg/m2  Gen: Well NAD  HEENT: EOMI, MMM  SKin: Lateral L foot w/ butterfly tattoo w/ erythematous indurated skin within likes of  butterfly wings were red dye is used, black dye areas and other tattoos w/o swelling.     No results found for this or any previous visit (from the past 72 hour(s)).  A/P (as seen in Problem list)  Foot pain No real improvement.  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 Hemostasis was obtained by pressure and wound was not sutured. Antibiotic dressing is applied, and wound care instructions provided. Be alert for any signs of cutaneous infection. The specimen is labeled and sent to pathology for evaluation. The procedure was well tolerated without complications.  nml skin, black ink, and inflammed red ink skin was all included in biopsy

## 2013-06-22 NOTE — Addendum Note (Signed)
Addended by: Henri Medal on: 06/22/2013 04:59 PM   Modules accepted: Orders

## 2013-06-22 NOTE — Patient Instructions (Addendum)
Thank you for coming in today I think this is all from the red ink in your tattoo Please start the steroids for the next 2 weeks I will call you with the results from the pathologist Please come back to see me in 10 days

## 2013-06-22 NOTE — Assessment & Plan Note (Addendum)
No real improvement.  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

## 2013-07-06 ENCOUNTER — Ambulatory Visit: Payer: Medicare Other | Admitting: Family Medicine

## 2013-07-08 ENCOUNTER — Ambulatory Visit (INDEPENDENT_AMBULATORY_CARE_PROVIDER_SITE_OTHER): Payer: Medicare Other | Admitting: Family Medicine

## 2013-07-08 ENCOUNTER — Encounter: Payer: Self-pay | Admitting: Family Medicine

## 2013-07-08 VITALS — BP 128/85 | HR 91 | Ht 67.0 in | Wt 219.0 lb

## 2013-07-08 DIAGNOSIS — F3289 Other specified depressive episodes: Secondary | ICD-10-CM

## 2013-07-08 DIAGNOSIS — E785 Hyperlipidemia, unspecified: Secondary | ICD-10-CM

## 2013-07-08 DIAGNOSIS — M545 Low back pain, unspecified: Secondary | ICD-10-CM

## 2013-07-08 DIAGNOSIS — F329 Major depressive disorder, single episode, unspecified: Secondary | ICD-10-CM

## 2013-07-08 DIAGNOSIS — E669 Obesity, unspecified: Secondary | ICD-10-CM

## 2013-07-08 DIAGNOSIS — M549 Dorsalgia, unspecified: Secondary | ICD-10-CM

## 2013-07-08 DIAGNOSIS — F32A Depression, unspecified: Secondary | ICD-10-CM

## 2013-07-08 DIAGNOSIS — G8929 Other chronic pain: Secondary | ICD-10-CM

## 2013-07-08 LAB — LIPID PANEL
CHOL/HDL RATIO: 4.2 ratio
Cholesterol: 146 mg/dL (ref 0–200)
HDL: 35 mg/dL — AB (ref >39)
LDL Cholesterol: 75 mg/dL (ref 0–99)
Triglycerides: 179 mg/dL — ABNORMAL HIGH (ref <150)
VLDL: 36 mg/dL (ref 0–40)

## 2013-07-08 MED ORDER — HYDROCODONE-ACETAMINOPHEN 5-325 MG PO TABS: 1.0000 | ORAL_TABLET | Freq: Four times a day (QID) | ORAL | Status: DC | PRN

## 2013-07-08 MED ORDER — SERTRALINE HCL 50 MG PO TABS: 100.0000 mg | ORAL_TABLET | Freq: Every day | ORAL | Status: DC

## 2013-07-08 MED ORDER — CYCLOBENZAPRINE HCL 10 MG PO TABS: 10.0000 mg | ORAL_TABLET | Freq: Three times a day (TID) | ORAL | Status: DC | PRN

## 2013-07-08 MED ORDER — ATORVASTATIN CALCIUM 80 MG PO TABS: 80.0000 mg | ORAL_TABLET | Freq: Every day | ORAL | Status: DC

## 2013-07-08 NOTE — Patient Instructions (Signed)
Dear Jenna Wong to see you. Please continue your regular medications. The most important thing you can do for your help is to exercise regularly to help reduce your weight and risk of stroke. Also, this will help with your low back pain.   Please follow up in 3 months.   Sincerely,   Dr. Maricela Bo

## 2013-07-08 NOTE — Progress Notes (Signed)
   Subjective:    Patient ID: Jenna Wong, female    DOB: May 29, 1951, 63 y.o.   MRN: [id number]  HPI  63 year old F with obesity, HLD, depression and chronic LBP who presents for f/u.   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 There is also some lateral disc protrusion on the left at L2-3 and L3-4. Disc degeneration and spondylosis at L4-5 and L5-S1.  Red Flags: no weakness, no numbness and tingling, no impaired bowel or bladder function  Depression:  Pt with chronic depression, taking sertraline 100 mg daily, denies active sadness, SI/HI, would like refill of medications   No current outpatient prescriptions on file prior to visit.   No current facility-administered medications on file prior to visit.     Review of Systems See HPI    Objective:   Physical Exam BP 128/85  Pulse 91  Ht 5\' 7"  (1.702 m)  Wt 219 lb (99.338 kg)  BMI 34.29 kg/m2  Gen: middle age female, obese  well  appearing, NAD, pleasant and conversant HEENT: NCAT, PERRLA, EOMI, OP clear and moist, no lymphadenopathy, no thyroid tenderness, enlargement, or nodules CV: RRR, no m/r/g, no JVD or carotid bruits Pulm: normal WOB, CTA-B Abd: soft, NDNT, NABS Neuro/Psych: A&Ox4, normal affect, speech, and thought content MSK: 5/5 strength of LE bilaterally, no edema, normal sensation to extremities        Assessment & Plan:

## 2013-07-09 NOTE — Assessment & Plan Note (Signed)
A: stable chronic low back pain with DDD and stenosis P: - Cont flexeril and norco 5-325 mg # 60 with 1 refill - Encourage exercise  - F/u PRN

## 2013-07-09 NOTE — Assessment & Plan Note (Signed)
A: stable P: cont sertraline 100 mg BID

## 2013-07-09 NOTE — Assessment & Plan Note (Signed)
A: pt with HLD and no LDL check since 2013 P: check cholesterol, cont lipitor 80 mg daily, encourage exercise

## 2013-07-10 ENCOUNTER — Encounter: Payer: Self-pay | Admitting: Family Medicine

## 2013-08-13 ENCOUNTER — Telehealth: Payer: Self-pay | Admitting: Family Medicine

## 2013-08-13 NOTE — Telephone Encounter (Signed)
Pt finished steroid course but missed f/u appt for foot pain and to review path report. Discussed report w/ pt. Minimal improvement in foot. Pt  To call and make f/u appt w/ Dr. Elsie Stain, MD Family Medicine PGY-3 08/13/2013, 3:12 PM

## 2014-02-01 ENCOUNTER — Other Ambulatory Visit: Payer: Self-pay

## 2014-02-01 DIAGNOSIS — Z1231 Encounter for screening mammogram for malignant neoplasm of breast: Secondary | ICD-10-CM

## 2014-02-09 ENCOUNTER — Ambulatory Visit
Admission: RE | Admit: 2014-02-09 | Discharge: 2014-02-09 | Disposition: A | Discharge status: Home / Self Care | Payer: Medicare Other | Source: Ambulatory Visit

## 2014-02-09 DIAGNOSIS — Z1231 Encounter for screening mammogram for malignant neoplasm of breast: Secondary | ICD-10-CM

## 2014-02-19 ENCOUNTER — Ambulatory Visit (INDEPENDENT_AMBULATORY_CARE_PROVIDER_SITE_OTHER): Payer: Medicare Other | Admitting: Family Medicine

## 2014-02-19 ENCOUNTER — Encounter: Payer: Self-pay | Admitting: Family Medicine

## 2014-02-19 VITALS — BP 138/80 | HR 89 | Temp 98.1°F | Wt 216.0 lb

## 2014-02-19 DIAGNOSIS — F3289 Other specified depressive episodes: Secondary | ICD-10-CM

## 2014-02-19 DIAGNOSIS — R7303 Prediabetes: Secondary | ICD-10-CM

## 2014-02-19 DIAGNOSIS — F32A Depression, unspecified: Secondary | ICD-10-CM

## 2014-02-19 DIAGNOSIS — R7309 Other abnormal glucose: Secondary | ICD-10-CM

## 2014-02-19 DIAGNOSIS — F329 Major depressive disorder, single episode, unspecified: Secondary | ICD-10-CM

## 2014-02-19 DIAGNOSIS — L723 Sebaceous cyst: Secondary | ICD-10-CM | POA: Insufficient documentation

## 2014-02-19 LAB — POCT GLYCOSYLATED HEMOGLOBIN (HGB A1C): Hemoglobin A1C: 6.3

## 2014-02-19 NOTE — Assessment & Plan Note (Addendum)
Repeat A1c in clinic today: 6.3 - Patient states that she was previously placed on metformin. She does not know when this was discontinued from her medication regimen. She states that her glucose levels were significantly better managed on this medication. - Consider repeating BMP and CBC at next visit. - Patient is currently not on an ACE inhibitor. She may benefit from low-dose regimen. GFR was 69 on last check (01/19/13).

## 2014-02-19 NOTE — Progress Notes (Signed)
Patient ID: Jenna Wong, female   DOB: [birth year], 63 y.o.   MRN: [id number]  Georges Lynch, MD, MS Phone: [phone]  Subjective:  Chief complaint: Sebaceous cyst  Pt Here for meeting new physician and sebaceous cyst. Patient states she has been experiencing mild to moderate discomfort caused by a 3 x 3 "knot" on her back for over a year now. She states that one year ago this lesion was lanced in the ED. Since that time it has persisted and not gone away. Over the past 2 weeks it has progressively started to get more painful. She states that nothing helps to make it better. She denies any fevers chills sweats nausea vomiting diarrhea headaches body aches. She is very ingested in having this cyst removed permanently. And is interested in ways to get this done so that it does not come back.   Patient is also a prediabetic with the previous A1c of 6.1 last year. She is on no medication for this at this time. She states that she has been relatively active and going regularly to the [facility] to ensure that her activity level stays relatively high. She was issues and relatively good health. She has some minor accident pains which she states is fairly well controlled with Flexeril and Norco 5/325. Although I do not feel entirely comfortable placing this patient on these medications chronically this patient has not asked for refills in her Norco and January of this year. I believe that she is not abusing this medication and that it is acceptable for her to have this low dose narcotic on hand for days which is experiencing abnormally high pain.  Review of Systems  Constitutional: Negative for fever, chills, weight loss and diaphoresis.  Eyes: Negative for blurred vision and double vision.  Respiratory: Negative for cough and shortness of breath.   Cardiovascular: Negative for chest pain and palpitations.  Gastrointestinal: Negative for heartburn, nausea, vomiting, abdominal pain, diarrhea and constipation.    Genitourinary: Negative for dysuria, urgency and frequency.  Musculoskeletal: Positive for back pain and neck pain. Negative for myalgias.  Skin: Negative for itching and rash.  Neurological: Negative for dizziness, seizures, loss of consciousness and headaches.  Psychiatric/Behavioral: Negative for depression, suicidal ideas and substance abuse.     Past Medical History Patient Active Problem List   Diagnosis Date Noted  . Sebaceous cyst 02/19/2014  . Foot pain 06/15/2013  . Bilateral anterior knee pain 07/14/2012  . Epidermoid cyst 04/24/2012  . Pre-diabetes 02/15/2012  . Irritable bowel syndrome (IBS) 10/10/2011  . Health counseling 04/11/2011  . Personal history of tobacco use 12/18/2010  . DEPRESSION, MAJOR 04/27/2009  . HYPERLIPIDEMIA 12/13/2008  . SPINAL STENOSIS, LUMBAR 06/22/2008  . INSOMNIA, PERSISTENT 01/13/2007  . OBESITY, NOS 08/22/2006  . BACK PAIN, LOW 08/22/2006    Medications- reviewed and updated Current Outpatient Prescriptions  Medication Sig Dispense Refill  . atorvastatin (LIPITOR) 80 MG tablet Take 1 tablet (80 mg total) by mouth daily.  90 tablet  3  . cyclobenzaprine (FLEXERIL) 10 MG tablet Take 1 tablet (10 mg total) by mouth 3 (three) times daily as needed for muscle spasms.  30 tablet  2  . HYDROcodone-acetaminophen (NORCO) 5-325 MG per tablet Take 1 tablet by mouth every 6 (six) hours as needed.  60 tablet  0  . HYDROcodone-acetaminophen (NORCO) 5-325 MG per tablet Take 1 tablet by mouth every 6 (six) hours as needed for moderate pain.  60 tablet  0  . sertraline (ZOLOFT) 50 MG  tablet Take 2 tablets (100 mg total) by mouth daily.  60 tablet  11   No current facility-administered medications for this visit.    Objective: BP 138/80  Pulse 89  Temp(Src) 98.1 F (36.7 C) (Oral)  Wt 216 lb (97.977 kg) Gen: NAD, alert, cooperative with exam HEENT: NCAT, EOMI CV: RRR Resp: CTABL, no wheezes, non-labored Abd: Soft, Non Tender, Non Distended, BS  present, no guarding or organomegaly Ext: No edema, warm; 3 x 3 raised moderately tender lesion with black pinpoint center pore noted on the left scapular spine. Neuro: Alert and oriented, No gross deficits   Assessment/Plan:  Sebaceous cyst Cyst located over the left scapula. Cyst appears to be approximately 3 cm x 3 cm. Cyst is raised and moderately tender. No erythema noted. Patient states that it was lanced in the ED approximately one year ago, has regrown since and has been getting worse over the past 2 weeks. Denies fever chills. - I have asked patient to reschedule an appointment specifically for procedure to excise cyst and associated capsule. - Cyst is chronic in nature and has not led to any associated symptoms so no antibiotic therapy appears to be necessary at this time.  Pre-diabetes Repeat A1c in clinic today: 6.3 - Patient states that she was previously placed on metformin. She does not know when this was discontinued from her medication regimen. She states that her glucose levels were significantly better managed on this medication. - Consider repeating BMP and CBC at next visit. - Patient is currently not on an ACE inhibitor. She may benefit from low-dose regimen. GFR was 69 on last check (01/19/13).    Orders Placed This Encounter  Procedures  . POCT glycosylated hemoglobin (Hb A1C)    No orders of the defined types were placed in this encounter.

## 2014-02-19 NOTE — Patient Instructions (Signed)
It was a pleasure seeing you today in our clinic. Today we discussed the sore on your back. Here is the treatment plan we have discussed and agreed upon together:  - Please schedule an appointment with me to have the sebaceous cyst removed. An end of the day appointment would be preferable. - I have refilled your prescription for Zoloft. - Continue being an active member of the [employer]. Core exercises and strengthening are encouraged however any activity will be beneficial long-term.

## 2014-02-19 NOTE — Progress Notes (Signed)
Reviewed

## 2014-02-19 NOTE — Assessment & Plan Note (Signed)
Cyst located over the left scapula. Cyst appears to be approximately 3 cm x 3 cm. Cyst is raised and moderately tender. No erythema noted. Patient states that it was lanced in the ED approximately one year ago, has regrown since and has been getting worse over the past 2 weeks. Denies fever chills. - I have asked patient to reschedule an appointment specifically for procedure to excise cyst and associated capsule. - Cyst is chronic in nature and has not led to any associated symptoms so no antibiotic therapy appears to be necessary at this time.

## 2014-03-03 ENCOUNTER — Telehealth: Payer: Self-pay | Admitting: Home Health Services

## 2014-03-03 NOTE — Telephone Encounter (Signed)
Jenna Wong (2050-07-29) a.       Atorvastatin 80 mg  77% adherence b.      Left message c.       Consider checking to see if patient has an upcoming appt and evaluate med adherence

## 2014-03-15 ENCOUNTER — Ambulatory Visit: Payer: Medicare Other | Admitting: Family Medicine

## 2014-05-28 ENCOUNTER — Ambulatory Visit (INDEPENDENT_AMBULATORY_CARE_PROVIDER_SITE_OTHER): Payer: Medicare Other | Admitting: Family Medicine

## 2014-05-28 VITALS — BP 135/72 | HR 81 | Temp 98.1°F | Ht 67.5 in | Wt 220.0 lb

## 2014-05-28 DIAGNOSIS — M545 Low back pain, unspecified: Secondary | ICD-10-CM

## 2014-05-28 DIAGNOSIS — F418 Other specified anxiety disorders: Secondary | ICD-10-CM

## 2014-05-28 DIAGNOSIS — G8929 Other chronic pain: Secondary | ICD-10-CM

## 2014-05-28 MED ORDER — HYDROCODONE-ACETAMINOPHEN 5-325 MG PO TABS
1.0000 | ORAL_TABLET | Freq: Four times a day (QID) | ORAL | Status: DC | PRN
Start: 2014-05-28 — End: 2015-09-26

## 2014-05-28 NOTE — Patient Instructions (Addendum)
It was a pleasure seeing you today in our clinic. Today we discussed your symptoms of depression, and some back pain. Here is the treatment plan we have discussed and agreed upon together:   - I have refilled the prescription for Norco. We have discussed that this medication is not to be used as a long term medication, it will hopefully be temporary.  - I have called [facility]. They did not pick up. Their number is [phone]. Please call them to make an appointment. If you need a referral just have them contact our office and I'd be happy to place it for you - I would like to see you back in one month!

## 2014-05-28 NOTE — Progress Notes (Signed)
HPI  Patient presents today for increased depression since the passing of her sister last month. Patient states that she has been experiencing increased depression since the passing of her sister last month. She states that she is very close with the sister and that she spent much of the time leading up to her passing at her side in hospital. She states that she is also been experiencing increasing anxiety as well as fatigue during this time. Nothing seems to make it better or worse. Patient has not seen a therapist for this depression. She has in the past seen a therapist however she is not been seen for many years. Patient did not report any suicidal or homicidal ideations at this time.  No other symptoms reported at this time.  She also was concerned about the A1c which had been obtained at her last visit. This came out to be 6.3. I do not believe that she requires any medical management at this time. I did inform her that I will recheck this value in the future.  Patient was a no-show for our prior visit which was made to remove a sebaceous cyst on her left scapula. Patient states that this cyst is still present and still causes her some discomfort. I offered to remove it for her in the future. Patient was okay with this but made no other effort to schedule an appointment for this procedure. I will wait until patient is motivated enough to ask for this appointment to be set.  Smoking status noted ROS: Per HPI  Objective: BP 135/72 mmHg  Pulse 81  Temp(Src) 98.1 F (36.7 C) (Oral)  Ht 5' 7.5" (1.715 m)  Wt 220 lb (99.791 kg)  BMI 33.93 kg/m2 Gen: NAD, alert, cooperative with exam HEENT: NCAT, EOMI, PERRL CV: RRR, good S1/S2, no murmur Resp: CTABL, no wheezes, non-labored Abd: SNTND, BS present, no guarding or organomegaly Ext: No edema, warm Neuro: Alert and oriented, No gross deficits  Assessment and plan:  Depression with anxiety Patient is complaining that she has a  increase in her amount of depression since the passing of her sister last month. She states that prior to this time she had been managing this depression fairly well on her 100 mg of Zoloft. I asked her about her history seeking any therapy. She stated that she had seen a therapist in the past but had been many years since she had gone to therapist. I encouraged her to seek out her old therapist. She states [facility] Patient states that she liked the people [facility] much better and would prefer to go there. During our office visit I attempted to call [facility] in order to set up an appointment for her. I attempted approximately 4 times without any success at reaching anyone at their facility. I did however leave a voicemail asking that they call back our office. I also gave the patient their phone number [phone]).  Patient seemed very satisfied with this plan. I asked that she follow-up with me or call our office if she has any worsening in depression or if she is unable to obtain an appointment with [facility].  No medication changes or additions deemed appropriate at this time.    No orders of the defined types were placed in this encounter.    Meds ordered this encounter  Medications  . HYDROcodone-acetaminophen (NORCO) 5-325 MG per tablet  Sig: Take 1 tablet by mouth every 6 (six) hours as needed for moderate pain.    Dispense:  60 tablet    Refill:  0     Elberta Leatherwood, MD,MS,  PGY1 05/28/2014 5:49 PM

## 2014-05-28 NOTE — Assessment & Plan Note (Signed)
Patient is complaining that she has a increase in her amount of depression since the passing of her sister last month. She states that prior to this time she had been managing this depression fairly well on her 100 mg of Zoloft. I asked her about her history seeking any therapy. She stated that she had seen a therapist in the past but had been many years since she had gone to therapist. I encouraged her to seek out her old therapist. She states that [facility] as [facility]". Patient states that she [facility] the people [facility] much better and would prefer to go there. During our office visit I attempted to call [facility] in order to set up an appointment for her. I attempted approximately 4 times without any success at reaching anyone at their facility. I did however leave a voicemail asking that they call back our office. I also gave the patient their phone number [phone]).  Patient seemed very satisfied with this plan. I asked that she follow-up with me or call our office if she has any worsening in depression or if she is unable to obtain an appointment with [facility].  No medication changes or additions deemed appropriate at this time.

## 2014-07-26 ENCOUNTER — Other Ambulatory Visit: Payer: Self-pay | Admitting: Family Medicine

## 2014-07-26 DIAGNOSIS — F329 Major depressive disorder, single episode, unspecified: Secondary | ICD-10-CM

## 2014-07-26 DIAGNOSIS — M545 Low back pain: Secondary | ICD-10-CM

## 2014-07-26 DIAGNOSIS — F32A Depression, unspecified: Secondary | ICD-10-CM

## 2014-07-26 DIAGNOSIS — G8929 Other chronic pain: Secondary | ICD-10-CM

## 2014-07-26 NOTE — Telephone Encounter (Signed)
Pt says [facility] is telling her they never received her refill info for sertaline Please advise

## 2014-07-27 MED ORDER — CYCLOBENZAPRINE HCL 10 MG PO TABS: 10.0000 mg | ORAL_TABLET | Freq: Three times a day (TID) | ORAL | Status: DC | PRN

## 2014-07-27 MED ORDER — SERTRALINE HCL 50 MG PO TABS: 100.0000 mg | ORAL_TABLET | Freq: Every day | ORAL | Status: DC

## 2014-07-27 MED ORDER — ATORVASTATIN CALCIUM 80 MG PO TABS: 80.0000 mg | ORAL_TABLET | Freq: Every day | ORAL | Status: DC

## 2014-07-27 NOTE — Telephone Encounter (Signed)
Pt called again and said that [facility] did not receive any of her refill from [place]. Can we call these in to the [place] location on [address]. It is her Lipitor, Zoloft, and Flexeril. jw

## 2014-07-29 DIAGNOSIS — H2513 Age-related nuclear cataract, bilateral: Secondary | ICD-10-CM | POA: Diagnosis not present

## 2014-07-29 DIAGNOSIS — H40033 Anatomical narrow angle, bilateral: Secondary | ICD-10-CM | POA: Diagnosis not present

## 2014-12-24 ENCOUNTER — Encounter: Payer: Self-pay | Admitting: Family Medicine

## 2014-12-24 ENCOUNTER — Ambulatory Visit (INDEPENDENT_AMBULATORY_CARE_PROVIDER_SITE_OTHER): Payer: Medicare Other | Admitting: Family Medicine

## 2014-12-24 VITALS — BP 138/71 | HR 98 | Temp 98.1°F | Ht 67.5 in | Wt 222.9 lb

## 2014-12-24 DIAGNOSIS — L723 Sebaceous cyst: Secondary | ICD-10-CM | POA: Diagnosis not present

## 2014-12-24 NOTE — Patient Instructions (Signed)
Thank you for coming in,   Make sure to monitor for signs of infection. If there is any redness of you develop a fever please let us know.   Please bring all of your medications with you to each visit.    Please feel free to call with any questions or concerns at any time, at [phone]. --Dr. Raeford Razor  Epidermal Cyst An epidermal cyst is sometimes called a sebaceous cyst, epidermal inclusion cyst, or infundibular cyst. These cysts usually contain a substance that looks "pasty" or "cheesy" and may have a bad smell. This substance is a protein called keratin. Epidermal cysts are usually found on the face, neck, or trunk. They may also occur in the vaginal area or other parts of the genitalia of both men and women. Epidermal cysts are usually small, painless, slow-growing bumps or lumps that move freely under the skin. It is important not to try to pop them. This may cause an infection and lead to tenderness and swelling. CAUSES  Epidermal cysts may be caused by a deep penetrating injury to the skin or a plugged hair follicle, often associated with acne. SYMPTOMS  Epidermal cysts can become inflamed and cause:  Redness.  Tenderness.  Increased temperature of the skin over the bumps or lumps.  Grayish-white, bad smelling material that drains from the bump or lump. DIAGNOSIS  Epidermal cysts are easily diagnosed by your caregiver during an exam. Rarely, a tissue sample (biopsy) may be taken to rule out other conditions that may resemble epidermal cysts. TREATMENT   Epidermal cysts often get better and disappear on their own. They are rarely ever cancerous.  If a cyst becomes infected, it may become inflamed and tender. This may require opening and draining the cyst. Treatment with antibiotics may be necessary. When the infection is gone, the cyst may be removed with minor surgery.  Small, inflamed cysts can often be treated with antibiotics or by injecting steroid medicines.  Sometimes,  epidermal cysts become large and bothersome. If this happens, surgical removal in your caregiver's office may be necessary. HOME CARE INSTRUCTIONS  Only take over-the-counter or prescription medicines as directed by your caregiver.  Take your antibiotics as directed. Finish them even if you start to feel better. SEEK MEDICAL CARE IF:   Your cyst becomes tender, red, or swollen.  Your condition is not improving or is getting worse.  You have any other questions or concerns. MAKE SURE YOU:  Understand these instructions.  Will watch your condition.  Will get help right away if you are not doing well or get worse. Document Released: 05/12/2004 Document Revised: 09/03/2011 Document Reviewed: 12/18/2010 [facility] Patient Information 2015 [place], [place]. This information is not intended to replace advice given to you by your health care provider. Make sure you discuss any questions you have with your health care provider.

## 2014-12-24 NOTE — Progress Notes (Signed)
   Subjective:    Patient ID: Jenna Wong, female    DOB: July 03, 1950, 64 y.o.   MRN: [id number]  Seen for Same day visit for   CC: cyst on back   Has been present for two years. Getting worse. At first it didn't hurt but now it does with pressure.  Denies any fevers or chills or night sweats.  Hx of this before and it was lanced and put packing in it. No draining.  Hurting more frequently. It has gotten bigger.  Located on left trapezius.   Review of Systems   See HPI for ROS. Objective:  BP 138/71 mmHg  Pulse 98  Temp(Src) 98.1 F (36.7 C) (Oral)  Ht 5' 7.5" (1.715 m)  Wt 222 lb 14.4 oz (101.107 kg)  BMI 34.38 kg/m2  General: NAD Skin: area of induration with central clearing hole located on the left trapezius. No erythema and tender to palpation.   Incision and Drainage Procedure Note: sebaceous cyst/left trapezius   The affected area was cleaned and draped in a sterile fashion. Anesthesia was achieved using 3 mL of 1% Lidocaine with epinephrine injected around the wound area using a 25-guage 1 inch needle. A 4 mm punch biopsy was used to incise the wound. Material was expressed from the cyst. A hemostat was used to break any loculations that were present.  A sterile dressing was applied to the area. The patient tolerated the procedure well. No complications were encountered.      Assessment & Plan:  See Problem List Documentation

## 2014-12-24 NOTE — Assessment & Plan Note (Signed)
Area was I&D today. Patient tolerated the procedure well and given indications of infection  Possible for cyst to return, if returns may need to excise or a referral to surgery may be warranted.  - discussed with Dr. Mingo Amber

## 2014-12-26 ENCOUNTER — Encounter: Payer: Self-pay | Admitting: Family Medicine

## 2014-12-30 ENCOUNTER — Encounter: Payer: Self-pay | Admitting: Family Medicine

## 2014-12-30 ENCOUNTER — Ambulatory Visit (INDEPENDENT_AMBULATORY_CARE_PROVIDER_SITE_OTHER): Payer: Medicare Other | Admitting: Family Medicine

## 2014-12-30 VITALS — BP 126/81 | HR 92 | Temp 98.1°F | Ht 67.5 in | Wt 221.9 lb

## 2014-12-30 DIAGNOSIS — L723 Sebaceous cyst: Secondary | ICD-10-CM | POA: Diagnosis not present

## 2014-12-30 NOTE — Patient Instructions (Signed)
Make sure to monitor for signs of infection. If there is any redness or if you develop a fever/chills please let us know.   The drainage is fine, as long as it doesn't become green in color.   Please feel free to call with any questions or concerns at any time, at [phone]. --Dr. Lorenso Courier  Cyst Removal Your caregiver has removed a cyst. A cyst is a sac containing a semi-solid material. Cysts may occur any place on your body. They may remain small for years or gradually get larger. A sebaceous cyst is an enlarged (dilated) sweat gland filled with old sweat (sebum).  HOME CARE INSTRUCTIONS   If possible, keep the area where the cyst was removed raised to relieve soreness, swelling, and promote healing..  Do not soak the area where the cyst was removed or go swimming. You may shower.  Do not overuse the area where your cyst was removed. SEEK IMMEDIATE MEDICAL CARE IF:   An oral temperature above 102 F (38.9 C) develops, not controlled by medication.  Blood continues to soak through the bandage.  You have increasing pain in the area where your cyst was removed.  You have redness, swelling, pus, a bad smell, soreness (inflammation), or red streaks coming away from the stitches. These are signs of infection.

## 2014-12-30 NOTE — Progress Notes (Signed)
Patient ID: Jenna Wong, female   DOB: 1950-08-09, 64 y.o.   MRN: [id number]    Subjective: CC: foul odor from I&D site HPI: Patient is a 64 y.o. female with a past medical history of pre-diabetes presenting to clinic today for concerns of foul odor from her I&D site.  On 7/1 she underwent left trapezius sebecous cyst I&D. She's been cleaning it with soap and water and uses a alcohol swab as well about 3-4x per day. She covers it with a bandaide.  On Tuesday she noticed a foul smell. This AM she felt the smell was more prominent.  When asked, the smell is similar to what it smelled like during the I&D.  No fevers or chills.  No surrounding erythema. Soreness from the procedure, but no worsening pain.   Social History: lives with sister, smoker, not ready to quit  ROS: All other systems reviewed and are negative.  Past Medical History Patient Active Problem List   Diagnosis Date Noted  . Sebaceous cyst 02/19/2014  . Pre-diabetes 02/15/2012  . Irritable bowel syndrome (IBS) 10/10/2011  . Personal history of tobacco use 12/18/2010  . Depression with anxiety 04/27/2009  . HYPERLIPIDEMIA 12/13/2008  . SPINAL STENOSIS, LUMBAR 06/22/2008  . INSOMNIA, PERSISTENT 01/13/2007  . OBESITY, NOS 08/22/2006  . BACK PAIN, LOW 08/22/2006    Medications- reviewed and updated Current Outpatient Prescriptions  Medication Sig Dispense Refill  . atorvastatin (LIPITOR) 80 MG tablet Take 1 tablet (80 mg total) by mouth daily. 90 tablet 3  . cyclobenzaprine (FLEXERIL) 10 MG tablet Take 1 tablet (10 mg total) by mouth 3 (three) times daily as needed for muscle spasms. 30 tablet 2  . HYDROcodone-acetaminophen (NORCO) 5-325 MG per tablet Take 1 tablet by mouth every 6 (six) hours as needed. 60 tablet 0  . HYDROcodone-acetaminophen (NORCO) 5-325 MG per tablet Take 1 tablet by mouth every 6 (six) hours as needed for moderate pain. 60 tablet 0  . sertraline (ZOLOFT) 50 MG tablet Take 2 tablets (100 mg total)  by mouth daily. 60 tablet 11   No current facility-administered medications for this visit.    Objective: Office vital signs reviewed. BP 126/81 mmHg  Pulse 92  Temp(Src) 98.1 F (36.7 C) (Oral)  Ht 5' 7.5" (1.715 m)  Wt 221 lb 14.4 oz (100.653 kg)  BMI 34.22 kg/m2   Physical Examination:  General: Awake, alert, well- nourished, NAD Skin: minimal induration surrounding an approximately 87mm circular punch over the left trapezius. Able to express a small amount of brownish colored, malodorous, thick material. Able to problem approximately 1.2cm deep. No pain. No surrounding erythema. No warmth.   Assessment/Plan: Sebaceous cyst No evidence of infection on history or exam. I was able to express what I felt was all the material. No induration noted. It does not feel like there are additional loculated areas requiring I&D.  - dicussed appropriate care  - patient voiced understanding that she may require additional I&Ds in the future vs consult to surgery. - RTC precautions discussed: fever, chills, erythema, change in drainage characteristics, pain. - If the area does not heal, she may need to come in for packing in the future, however I feel at this time it is not warranted.     No orders of the defined types were placed in this encounter.    No orders of the defined types were placed in this encounter.    Archie Patten PGY-2, [place]

## 2014-12-30 NOTE — Assessment & Plan Note (Signed)
No evidence of infection on history or exam. I was able to express what I felt was all the material. No induration noted. It does not feel like there are additional loculated areas requiring I&D.  - dicussed appropriate care  - patient voiced understanding that she may require additional I&Ds in the future vs consult to surgery. - RTC precautions discussed: fever, chills, erythema, change in drainage characteristics, pain. - If the area does not heal, she may need to come in for packing in the future, however I feel at this time it is not warranted.

## 2015-04-09 ENCOUNTER — Emergency Department (HOSPITAL_COMMUNITY)
Admission: EM | Admit: 2015-04-09 | Discharge: 2015-04-09 | Disposition: A | Discharge status: Home / Self Care | Payer: Medicare Other | Attending: Emergency Medicine | Admitting: Emergency Medicine

## 2015-04-09 ENCOUNTER — Encounter (HOSPITAL_COMMUNITY): Payer: Self-pay | Admitting: *Deleted

## 2015-04-09 DIAGNOSIS — N764 Abscess of vulva: Secondary | ICD-10-CM | POA: Insufficient documentation

## 2015-04-09 DIAGNOSIS — Z8659 Personal history of other mental and behavioral disorders: Secondary | ICD-10-CM | POA: Diagnosis not present

## 2015-04-09 DIAGNOSIS — Z79899 Other long term (current) drug therapy: Secondary | ICD-10-CM | POA: Diagnosis not present

## 2015-04-09 DIAGNOSIS — Z7982 Long term (current) use of aspirin: Secondary | ICD-10-CM | POA: Insufficient documentation

## 2015-04-09 DIAGNOSIS — Z8739 Personal history of other diseases of the musculoskeletal system and connective tissue: Secondary | ICD-10-CM | POA: Diagnosis not present

## 2015-04-09 DIAGNOSIS — Z720 Tobacco use: Secondary | ICD-10-CM | POA: Diagnosis not present

## 2015-04-09 MED ORDER — HYDROCODONE-ACETAMINOPHEN 5-325 MG PO TABS: 1.0000 | ORAL_TABLET | Freq: Four times a day (QID) | ORAL | Status: DC | PRN

## 2015-04-09 MED ORDER — SULFAMETHOXAZOLE-TRIMETHOPRIM 800-160 MG PO TABS
1.0000 | ORAL_TABLET | Freq: Once | ORAL | Status: AC
  Administered 2015-04-09: 1 via ORAL
  Filled 2015-04-09: qty 1

## 2015-04-09 MED ORDER — LIDOCAINE HCL 2 % IJ SOLN
20.0000 mL | Freq: Once | INTRAMUSCULAR | Status: AC
  Administered 2015-04-09: 400 mg
  Filled 2015-04-09: qty 20

## 2015-04-09 MED ORDER — SULFAMETHOXAZOLE-TRIMETHOPRIM 800-160 MG PO TABS: 1.0000 | ORAL_TABLET | Freq: Two times a day (BID) | ORAL | Status: AC

## 2015-04-09 NOTE — ED Notes (Signed)
Noted abscess to rt outer labia majora with pustulant center and hardness surrounding. Pt reported painful to touch. No drainage note.

## 2015-04-09 NOTE — Discharge Instructions (Signed)
Abscess °An abscess is an infected area that contains a collection of pus and debris. It can occur in almost any part of the body. An abscess is also known as a furuncle or boil. °CAUSES  °An abscess occurs when tissue gets infected. This can occur from blockage of oil or sweat glands, infection of hair follicles, or a minor injury to the skin. As the body tries to fight the infection, pus collects in the area and creates pressure under the skin. This pressure causes pain. People with weakened immune systems have difficulty fighting infections and get certain abscesses more often.  °SYMPTOMS °Usually an abscess develops on the skin and becomes a painful mass that is red, warm, and tender. If the abscess forms under the skin, you may feel a moveable soft area under the skin. Some abscesses break open (rupture) on their own, but most will continue to get worse without care. The infection can spread deeper into the body and eventually into the bloodstream, causing you to feel ill.  °DIAGNOSIS  °Your caregiver will take your medical history and perform a physical exam. A sample of fluid may also be taken from the abscess to determine what is causing your infection. °TREATMENT  °Your caregiver may prescribe antibiotic medicines to fight the infection. However, taking antibiotics alone usually does not cure an abscess. Your caregiver may need to make a small cut (incision) in the abscess to drain the pus. In some cases, gauze is packed into the abscess to reduce pain and to continue draining the area. °HOME CARE INSTRUCTIONS  °· Only take over-the-counter or prescription medicines for pain, discomfort, or fever as directed by your caregiver. °· If you were prescribed antibiotics, take them as directed. Finish them even if you start to feel better. °· If gauze is used, follow your caregiver's directions for changing the gauze. °· To avoid spreading the infection: °· Keep your draining abscess covered with a  bandage. °· Wash your hands well. °· Do not share personal care items, towels, or whirlpools with others. °· Avoid skin contact with others. °· Keep your skin and clothes clean around the abscess. °· Keep all follow-up appointments as directed by your caregiver. °SEEK MEDICAL CARE IF:  °· You have increased pain, swelling, redness, fluid drainage, or bleeding. °· You have muscle aches, chills, or a general ill feeling. °· You have a fever. °MAKE SURE YOU:  °· Understand these instructions. °· Will watch your condition. °· Will get help right away if you are not doing well or get worse. °  °This information is not intended to replace advice given to you by your health care provider. Make sure you discuss any questions you have with your health care provider. °  °Document Released: 03/21/2005 Document Revised: 12/11/2011 Document Reviewed: 08/24/2011 °Elsevier Interactive Patient Education ©2016 [employer] ° °Incision and Drainage °Incision and drainage is a procedure in which a sac-like structure (cystic structure) is opened and drained. The area to be drained usually contains material such as pus, fluid, or blood.  °LET YOUR CAREGIVER KNOW ABOUT:  °· Allergies to medicine. °· Medicines taken, including vitamins, herbs, eyedrops, over-the-counter medicines, and creams. °· Use of steroids (by mouth or creams). °· Previous problems with anesthetics or numbing medicines. °· History of bleeding problems or blood clots. °· Previous surgery. °· Other health problems, including diabetes and kidney problems. °· Possibility of pregnancy, if this applies. °RISKS AND COMPLICATIONS °· Pain. °· Bleeding. °· Scarring. °· Infection. °BEFORE THE PROCEDURE  °  You may need to have an ultrasound or other imaging tests to see how large or deep your cystic structure is. Blood tests may also be used to determine if you have an infection or how severe the infection is. You may need to have a tetanus shot. °PROCEDURE  °The affected area  is cleaned with a cleaning fluid. The cyst area will then be numbed with a medicine (local anesthetic). A small incision will be made in the cystic structure. A syringe or catheter may be used to drain the contents of the cystic structure, or the contents may be squeezed out. The area will then be flushed with a cleansing solution. After cleansing the area, it is often gently packed with a gauze or another wound dressing. Once it is packed, it will be covered with gauze and tape or some other type of wound dressing.  °AFTER THE PROCEDURE  °· Often, you will be allowed to go home right after the procedure. °· You may be given antibiotic medicine to prevent or heal an infection. °· If the area was packed with gauze or some other wound dressing, you will likely need to come back in 1 to 2 days to get it removed. °· The area should heal in about 14 days. °  °This information is not intended to replace advice given to you by your health care provider. Make sure you discuss any questions you have with your health care provider. °  °Document Released: 12/05/2000 Document Revised: 12/11/2011 Document Reviewed: 08/06/2011 °Elsevier Interactive Patient Education ©2016 Else[employer] Inc. ° °

## 2015-04-09 NOTE — ED Provider Notes (Signed)
CSN: [id number]     Arrival date & time 04/09/15  1045 History   First MD Initiated Contact with Patient 04/09/15 1052     Chief Complaint  Patient presents with  . Abscess    Pt presents with an abscess on her upper right thigh which she noticed Thursday     (Consider location/radiation/quality/duration/timing/severity/associated sxs/prior Treatment) HPI   Jenna Wong y.o. Jenna Lynch, MD PMH: abscess, spinal stenosis  Blood pressure 134/66, pulse 94, temperature 97.7 F (36.5 C), temperature source Oral, resp. rate 20, height 5\' 7"  (1.702 m), weight 200 lb (90.719 kg), SpO2 99 %.  Chief Complaint: Abscess Onset: Thursday, 3 days ago Location: right labia majora  History of abscesses: yes Quality and Severity: "just hurt real bad"  Associated symptoms: no associated symptoms Treatments tried: warm compresses Course: worsening, getting larger.  ROS: The patient denies diaphoresis, fever, headache, weakness (general or focal), confusion, change of vision,  dysphagia, aphagia, shortness of breath,  abdominal pains, nausea, vomiting, diarrhea, lower extremity swelling, rash, neck pain, chest pain     Past Medical History  Diagnosis Date  . Abnormal mammogram 12/00    repeat normal  . Abscess breast 7/80  . Spinal stenosis     MRI LS spine 9/09  . Cervical disc displacement     diagnosed by MRI--sees dr Jenna Wong, evaluation by disability determination services for permanent disability 04/25/00  . 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 SPINAL STENOSIS, LUMBAR 06/22/2008  . Breast abscess of female 07/18/2011   Past Surgical History  Procedure Laterality Date  . Abdominal hysterectomy    . Myomectomy     Family History  Problem Relation Age of Onset  . Hypertension Mother   . Hypertension Father   . Depression Sister   . Diabetes Sister   . Stroke Sister   . Parkinsonism Brother   . Dementia Brother    Social History    Substance Use Topics  . Smoking status: Current Every Day Smoker -- 1.00 packs/day for 30 years    Types: Cigarettes  . Smokeless tobacco: Never Used     Comment: thinking about it  . Alcohol Use: Yes     Comment: 1 x per month   OB History    No data available     Review of Systems  10 Systems reviewed and are negative for acute change except as noted in the HPI.     Allergies  Review of patient's allergies indicates no known allergies.  Home Medications   Prior to Admission medications   Medication Sig Start Date End Date Taking? Authorizing Provider  aspirin 81 MG tablet Take 81 mg by mouth daily.   Yes Historical Provider, MD  atorvastatin (LIPITOR) 80 MG tablet Take 1 tablet (80 mg total) by mouth daily. 07/27/14  Yes Elberta Leatherwood, MD  cyclobenzaprine (FLEXERIL) 10 MG tablet Take 1 tablet (10 mg total) by mouth 3 (three) times daily as needed for muscle spasms. 07/27/14  Yes Elberta Leatherwood, MD  HYDROcodone-acetaminophen (NORCO) 5-325 MG per tablet Take 1 tablet by mouth every 6 (six) hours as needed for moderate pain. 05/28/14  Yes Elberta Leatherwood, MD  sertraline (ZOLOFT) 50 MG tablet Take 2 tablets (100 mg total) by mouth daily. 07/27/14  Yes Elberta Leatherwood, MD  HYDROcodone-acetaminophen (NORCO/VICODIN) 5-325 MG tablet Take 1 tablet by mouth every 6 (six) hours as needed. 04/09/15   Delos Haring,  PA-C  sulfamethoxazole-trimethoprim (BACTRIM DS,SEPTRA DS) 800-160 MG tablet Take 1 tablet by mouth 2 (two) times daily. 04/09/15 04/16/15  Jenna Wong Jenna Raspberry, PA-C   BP 134/66 mmHg  Pulse 94  Temp(Src) 97.7 F (36.5 C) (Oral)  Resp 20  Ht 5\' 7"  (1.702 m)  Wt 200 lb (90.719 kg)  BMI 31.32 kg/m2  SpO2 99% Physical Exam  Constitutional: She appears well-developed and well-nourished. No distress.  HENT:  Head: Normocephalic and atraumatic.  Eyes: Pupils are equal, round, and reactive to light.  Neck: Normal range of motion. Neck supple.  Cardiovascular: Normal rate and regular rhythm.    Pulmonary/Chest: Effort normal.  Abdominal: Soft.  Genitourinary:     Neurological: She is alert.  Skin: Skin is warm and dry.  Nursing note and vitals reviewed.   ED Course  Procedures (including critical care time) Labs Review Labs Reviewed - No data to display  Imaging Review No results found. I have personally reviewed and evaluated these images and lab results as part of my medical decision-making.   EKG Interpretation None      MDM   Final diagnoses:  Labial abscess    INCISION AND DRAINAGE Performed by: Linus Mako Consent: Verbal consent obtained. Risks and benefits: risks, benefits and alternatives were discussed Type: abscess  Body area: right labia majora  Anesthesia: local infiltration  Incision was made with a scalpel.  Local anesthetic: lidocaine 2 % wo epinephrine  Anesthetic total: 3 ml  Complexity: complex Blunt dissection to break up loculations  Drainage: purulent  Drainage amount: large   Packing material: 1/4 in iodoform gauze, approx 5 inches  Patient tolerance: Patient tolerated the procedure well with no immediate complications.   Patient to have packing changed in 5 days, RN reports department unable to find a wort catheter. Significant improvement of symptoms and swelling s/p I&D. Started on Bactrim. F/u on Monday at PCP for packing change, is to return to the ED if unable to see PCP.  Medications  lidocaine (XYLOCAINE) 2 % (with pres) injection 400 mg (400 mg Other Given 04/09/15 1119)  sulfamethoxazole-trimethoprim (BACTRIM DS,SEPTRA DS) 800-160 MG per tablet 1 tablet (1 tablet Oral Given 04/09/15 1119)    64 y.o.Jenna Wong's medical screening exam was performed and I feel the patient has had an appropriate workup for their chief complaint at this time and likelihood of emergent condition existing is low. They have been counseled on decision, discharge, follow up and which symptoms necessitate immediate return to  the emergency department. They or their family verbally stated understanding and agreement with plan and discharged in stable condition.   Vital signs are stable at discharge. Filed Vitals:   04/09/15 1138  BP: 124/77  Pulse: 83  Temp:   Resp: [id number] PA-C 04/09/15 Ozan Yao, MD 04/09/15 1501

## 2015-04-09 NOTE — ED Notes (Signed)
Awake. Verbally responsive. A/O x4. Resp even and unlabored. No audible adventitious breath sounds noted. ABC's intact.  

## 2015-04-09 NOTE — ED Notes (Addendum)
Tiffany, PA at bedside to I&D abscess to rt labia majora. Pt tolerated well. Applied dry dsg.

## 2015-04-09 NOTE — ED Notes (Signed)
Pt stated she has been having swelling and pain in the upper right thigh area since Thursday. Pt denies any drainage and has not been running any fevers.

## 2015-05-10 ENCOUNTER — Other Ambulatory Visit: Payer: Self-pay

## 2015-05-10 DIAGNOSIS — Z1231 Encounter for screening mammogram for malignant neoplasm of breast: Secondary | ICD-10-CM

## 2015-05-31 ENCOUNTER — Ambulatory Visit
Admission: RE | Admit: 2015-05-31 | Discharge: 2015-05-31 | Disposition: A | Discharge status: Home / Self Care | Payer: Medicare Other | Source: Ambulatory Visit

## 2015-05-31 DIAGNOSIS — Z1231 Encounter for screening mammogram for malignant neoplasm of breast: Secondary | ICD-10-CM

## 2015-06-06 ENCOUNTER — Ambulatory Visit (INDEPENDENT_AMBULATORY_CARE_PROVIDER_SITE_OTHER): Payer: Medicare Other | Admitting: Family Medicine

## 2015-06-06 DIAGNOSIS — F32A Depression, unspecified: Secondary | ICD-10-CM

## 2015-06-06 DIAGNOSIS — L723 Sebaceous cyst: Secondary | ICD-10-CM

## 2015-06-06 DIAGNOSIS — Z0000 Encounter for general adult medical examination without abnormal findings: Secondary | ICD-10-CM

## 2015-06-06 DIAGNOSIS — F329 Major depressive disorder, single episode, unspecified: Secondary | ICD-10-CM | POA: Diagnosis not present

## 2015-06-06 DIAGNOSIS — Z23 Encounter for immunization: Secondary | ICD-10-CM | POA: Diagnosis not present

## 2015-06-06 DIAGNOSIS — E785 Hyperlipidemia, unspecified: Secondary | ICD-10-CM | POA: Diagnosis not present

## 2015-06-06 DIAGNOSIS — Z87891 Personal history of nicotine dependence: Secondary | ICD-10-CM

## 2015-06-06 DIAGNOSIS — F418 Other specified anxiety disorders: Secondary | ICD-10-CM | POA: Diagnosis not present

## 2015-06-06 MED ORDER — SERTRALINE HCL 50 MG PO TABS: 150.0000 mg | ORAL_TABLET | Freq: Every day | ORAL | Status: DC

## 2015-06-06 NOTE — Assessment & Plan Note (Signed)
Skin lesion at the site of sebaceous cyst removal has healed well. Unfortunately some purulent fluid was expressed with some pressure applied to the site. Patient is complaining of some malodorous qualities to the site. - Unfortunately, I believe that some of the cyst capsule may still have been present at the time of wound closure. This lesion is likely to resurface over time. I do not believe that there is much to be done at this time however if/when the cyst recurs I would be happy to remove it at that time.

## 2015-06-06 NOTE — Patient Instructions (Signed)
It was a pleasure seeing you today in our clinic. Today we discussed your health maintenance and depression. Here is the treatment plan we have discussed and agreed upon together:   - Today I increased her Zoloft to 150 mg daily. This means you should take 3 tablets every day. - I have ordered blood labs to be taken. Because one of these needs to be fasting and would like you to come in at any point this week to have these labs drawn. We need you to be without food over a 8 hour period. He would likely be easiest if he came in first thing in the morning. Our office opens at 8:30 AM.

## 2015-06-06 NOTE — Assessment & Plan Note (Signed)
Patient is currently on 100 mg of Zoloft daily. Patient reports some continued issues with depression. No new PHQ 9 at this time. Denied SI/HI. No complaints of hopelessness. - Increase Zoloft to 150 mg daily.

## 2015-06-06 NOTE — Assessment & Plan Note (Signed)
Patient has a 40+-pack-year history. We discussed the possibility of low-dose CT screen, as patient qualifies for this at this time. Patient did not appear interested in this however may warm up to the idea over time. - Will readdress recommendations for low-dose CT screen in the future. - Patient is not interested in smoking cessation at this time. However, it was discussed during our visit.

## 2015-06-06 NOTE — Progress Notes (Signed)
Jenna Lynch, MD, MS Phone: [phone]  Subjective:  CC -- Annual Physical; With complaints of increased depression  Pt reports she increased depression. Use to go to therapists, but hasn't gone to there in over a year. 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 He is 62yo. Relationship is going well. No setbacks.   Has concern about a sebaceous cyst. The site continues to have some malodorous qualities to it. No other issues.   Cardiovascular: - Risk: pending lipid panel. - Dx Hypertension: no, but BP today was borderline.  - Dx Hyperlipidemia: yes, treated  - Dx Obesity: yes  - Physical Activity: yes, walking; was going to gym until they started charging.  - Diabetes: no   Cancer: Colorectal >> Colonoscopy: UTD  Lung >> Tobacco Use: yes, 1 pack/day x 42yrs   - If so, previous Low-Dose CT screen: no, no symptoms at this time. Patient not interested at this time. Breast >> Mammogram: yes, last week Cervical/Endometrial >>  - Postmenopausal: s/p hysto  - Vaginal Bleeding: no - Pap Smear: no   - Previous Abnormal Pap: no Skin >> Suspicious lesions: yes, the sebaceous cyst on back.   Social: Alcohol Use: yes, 2-3 drinks a week Tobacco Use: yes   - Interested in Quitting: no, "maybe if I get over the depression"  Other Drugs: no  Risky Sexual Behavior: no  Depression: yes   - PHQ9 score: not performed today. Support and Life at Home: yes, family   Other: Zoster Vaccine: "will check on it" according to patient Flu Vaccine: yes  Pneumonia Vaccine: no  ROS- denies headache, dizziness, chest pain, shortness of breath, cough, hemoptysis, abdominal pain, diarrhea, hematochezia, dysuria.  Past Medical History Patient Active Problem List   Diagnosis Date Noted  . Sebaceous cyst 02/19/2014  . Pre-diabetes 02/15/2012  . Irritable bowel syndrome (IBS) 10/10/2011  . Personal history of tobacco use 12/18/2010  . Depression with anxiety 04/27/2009  . Hyperlipidemia 12/13/2008  .  SPINAL STENOSIS, LUMBAR 06/22/2008  . INSOMNIA, PERSISTENT 01/13/2007  . OBESITY, NOS 08/22/2006  . BACK PAIN, LOW 08/22/2006    Medications- reviewed and updated Current Outpatient Prescriptions  Medication Sig Dispense Refill  . aspirin 81 MG tablet Take 81 mg by mouth daily.    Marland Kitchen atorvastatin (LIPITOR) 80 MG tablet Take 1 tablet (80 mg total) by mouth daily. 90 tablet 3  . cyclobenzaprine (FLEXERIL) 10 MG tablet Take 1 tablet (10 mg total) by mouth 3 (three) times daily as needed for muscle spasms. 30 tablet 2  . HYDROcodone-acetaminophen (NORCO) 5-325 MG per tablet Take 1 tablet by mouth every 6 (six) hours as needed for moderate pain. 60 tablet 0  . HYDROcodone-acetaminophen (NORCO/VICODIN) 5-325 MG tablet Take 1 tablet by mouth every 6 (six) hours as needed. 6 tablet 0  . sertraline (ZOLOFT) 50 MG tablet Take 3 tablets (150 mg total) by mouth daily. 90 tablet 11   No current facility-administered medications for this visit.    Objective: There were no vitals taken for this visit. Gen: NAD, alert, cooperative with exam HEENT: NCAT, EOMI, PERRL CV: RRR, no murmur Resp: CTABL, no wheezes, non-labored Abd: Soft, Non Tender, Non Distended, BS present, no guarding or organomegaly Genital Exam: not done Integument: No suspicious lesions noted, healed lesion present from previous sebaceous cyst removal. Some purulent discharge able to be expressed over this lesion. Ext: No edema, warm Neuro: Alert and oriented, No gross deficits   Assessment/Plan:  Hyperlipidemia Patient currently taking atorvastatin 80 mg daily.  Patient reports good compliance with medications. - Repeat lipid panel.  Depression with anxiety Patient is currently on 100 mg of Zoloft daily. Patient reports some continued issues with depression. No new PHQ 9 at this time. Denied SI/HI. No complaints of hopelessness. - Increase Zoloft to 150 mg daily.  Sebaceous cyst Skin lesion at the site of sebaceous cyst  removal has healed well. Unfortunately some purulent fluid was expressed with some pressure applied to the site. Patient is complaining of some malodorous qualities to the site. - Unfortunately, I believe that some of the cyst capsule may still have been present at the time of wound closure. This lesion is likely to resurface over time. I do not believe that there is much to be done at this time however if/when the cyst recurs I would be happy to remove it at that time.  Personal history of tobacco use Patient has a 40+-pack-year history. We discussed the possibility of low-dose CT screen, as patient qualifies for this at this time. Patient did not appear interested in this however may warm up to the idea over time. - Will readdress recommendations for low-dose CT screen in the future. - Patient is not interested in smoking cessation at this time. However, it was discussed during our visit.    Orders Placed This Encounter  Procedures  . Lipid panel    Standing Status: Future     Number of Occurrences:      Standing Expiration Date: 06/05/2016  . BASIC METABOLIC PANEL WITH GFR    Standing Status: Future     Number of Occurrences:      Standing Expiration Date: 06/05/2016  . TSH    Standing Status: Future     Number of Occurrences:      Standing Expiration Date: 06/05/2016    Meds ordered this encounter  Medications  . sertraline (ZOLOFT) 50 MG tablet    Sig: Take 3 tablets (150 mg total) by mouth daily.    Dispense:  90 tablet    Refill:  11     Elberta Leatherwood, MD,MS,  PGY2 06/06/2015 6:55 PM

## 2015-06-06 NOTE — Assessment & Plan Note (Signed)
Patient currently taking atorvastatin 80 mg daily. Patient reports good compliance with medications. - Repeat lipid panel.

## 2015-06-10 ENCOUNTER — Other Ambulatory Visit: Payer: Medicare Other

## 2015-06-10 DIAGNOSIS — F329 Major depressive disorder, single episode, unspecified: Secondary | ICD-10-CM

## 2015-06-10 DIAGNOSIS — Z0000 Encounter for general adult medical examination without abnormal findings: Secondary | ICD-10-CM

## 2015-06-10 DIAGNOSIS — F32A Depression, unspecified: Secondary | ICD-10-CM

## 2015-06-10 DIAGNOSIS — E785 Hyperlipidemia, unspecified: Secondary | ICD-10-CM

## 2015-06-10 LAB — BASIC METABOLIC PANEL WITH GFR
BUN: 13 mg/dL (ref 7–25)
CALCIUM: 9.6 mg/dL (ref 8.6–10.4)
CO2: 28 mmol/L (ref 20–31)
CREATININE: 0.99 mg/dL (ref 0.50–0.99)
Chloride: 103 mmol/L (ref 98–110)
GFR, EST AFRICAN AMERICAN: 71 mL/min (ref >=60)
GFR, EST NON AFRICAN AMERICAN: 61 mL/min (ref >=60)
GLUCOSE: 174 mg/dL — AB (ref 65–99)
Potassium: 4.5 mmol/L (ref 3.5–5.3)
Sodium: 138 mmol/L (ref 135–146)

## 2015-06-10 LAB — LIPID PANEL
CHOL/HDL RATIO: 5.8 ratio — AB (ref <=5.0)
CHOLESTEROL: 228 mg/dL — AB (ref 125–200)
HDL: 39 mg/dL — AB (ref >=46)
LDL Cholesterol: 165 mg/dL — ABNORMAL HIGH (ref <130)
Triglycerides: 120 mg/dL (ref <150)
VLDL: 24 mg/dL (ref <30)

## 2015-06-10 LAB — TSH: TSH: 1.034 u[IU]/mL (ref 0.350–4.500)

## 2015-06-10 NOTE — Progress Notes (Signed)
Labs done today Vanesa Renier 

## 2015-06-22 ENCOUNTER — Encounter: Payer: Self-pay | Admitting: Student

## 2015-06-22 ENCOUNTER — Telehealth: Payer: Self-pay | Admitting: Student

## 2015-06-22 DIAGNOSIS — E785 Hyperlipidemia, unspecified: Secondary | ICD-10-CM

## 2015-06-22 DIAGNOSIS — R739 Hyperglycemia, unspecified: Secondary | ICD-10-CM

## 2015-06-22 MED ORDER — ATORVASTATIN CALCIUM 40 MG PO TABS: 40.0000 mg | ORAL_TABLET | Freq: Every day | ORAL | Status: DC

## 2015-06-22 NOTE — Telephone Encounter (Signed)
Called patient to discuss her results (lipid panel, BMP and TSH from recent visit). Her cholesterol level has gone up from previous. She says she has been out of her medication for a while. She was on atorvastatin 80 mg in the past. She denies having issues with that in the past. I have sent a prescription for atorvastatin 40 mg once daily with 3 refills.  Her BMP is also significant for glucose of 174. Her last A1c was 6.6 more than a year ago. I recommended following up on this as soon as she can. I have placed a future order for A1c. Patient appreciated the call and agreed to do as she was told.

## 2015-06-22 NOTE — Telephone Encounter (Signed)
Sent prescription for Atorvastatin 40 mg #90 with 2 refills Placed future order for A1c

## 2015-06-24 ENCOUNTER — Telehealth: Payer: Self-pay | Admitting: Family Medicine

## 2015-06-24 NOTE — Telephone Encounter (Signed)
Lab appointment scheduled for 1/3.

## 2015-06-24 NOTE — Telephone Encounter (Signed)
Pt called because she said the Dr. Cyndia Skeeters called her to come in for an A1C. We need to have orders in before I can schedule the appointment. Once orders are put in call patient or let me know to make an appointment. jw

## 2015-06-28 ENCOUNTER — Other Ambulatory Visit (INDEPENDENT_AMBULATORY_CARE_PROVIDER_SITE_OTHER): Payer: Medicare Other

## 2015-06-28 DIAGNOSIS — R7303 Prediabetes: Secondary | ICD-10-CM | POA: Diagnosis not present

## 2015-06-28 LAB — POCT GLYCOSYLATED HEMOGLOBIN (HGB A1C): HEMOGLOBIN A1C: 6.9

## 2015-07-30 ENCOUNTER — Emergency Department (HOSPITAL_COMMUNITY)
Admission: EM | Admit: 2015-07-30 | Discharge: 2015-07-30 | Disposition: A | Discharge status: Home / Self Care | Payer: Medicare Other | Attending: Emergency Medicine | Admitting: Emergency Medicine

## 2015-07-30 ENCOUNTER — Encounter (HOSPITAL_COMMUNITY): Payer: Self-pay | Admitting: *Deleted

## 2015-07-30 DIAGNOSIS — Z79899 Other long term (current) drug therapy: Secondary | ICD-10-CM | POA: Diagnosis not present

## 2015-07-30 DIAGNOSIS — L02212 Cutaneous abscess of back [any part, except buttock]: Secondary | ICD-10-CM | POA: Insufficient documentation

## 2015-07-30 DIAGNOSIS — Z4801 Encounter for change or removal of surgical wound dressing: Secondary | ICD-10-CM | POA: Diagnosis present

## 2015-07-30 DIAGNOSIS — Z8742 Personal history of other diseases of the female genital tract: Secondary | ICD-10-CM | POA: Diagnosis not present

## 2015-07-30 DIAGNOSIS — F17210 Nicotine dependence, cigarettes, uncomplicated: Secondary | ICD-10-CM | POA: Insufficient documentation

## 2015-07-30 DIAGNOSIS — H9209 Otalgia, unspecified ear: Secondary | ICD-10-CM | POA: Diagnosis not present

## 2015-07-30 DIAGNOSIS — Z8739 Personal history of other diseases of the musculoskeletal system and connective tissue: Secondary | ICD-10-CM | POA: Diagnosis not present

## 2015-07-30 DIAGNOSIS — R509 Fever, unspecified: Secondary | ICD-10-CM | POA: Insufficient documentation

## 2015-07-30 DIAGNOSIS — Z7982 Long term (current) use of aspirin: Secondary | ICD-10-CM | POA: Insufficient documentation

## 2015-07-30 DIAGNOSIS — L0291 Cutaneous abscess, unspecified: Secondary | ICD-10-CM

## 2015-07-30 MED ORDER — SULFAMETHOXAZOLE-TRIMETHOPRIM 800-160 MG PO TABS: 1.0000 | ORAL_TABLET | Freq: Two times a day (BID) | ORAL | Status: AC

## 2015-07-30 NOTE — ED Provider Notes (Signed)
CSN: [id number]     Arrival date & time 07/30/15  1743 History  By signing my name below, I, Jenna Wong, attest that this documentation has been prepared under the direction and in the presence of Harlene Ramus, [place]. Electronically Signed: Judithann Sauger, ED Scribe. 07/30/2015. 7:46 PM.    Chief Complaint  Patient presents with  . Abscess  . Wound Check   The history is provided by the patient. No language interpreter was used.   HPI Comments: Jenna Wong is a 65 y.o. female who presents to the Emergency Department complaining of gradually worsening moderately painful area to the left upper back with intermittent black foul smelling drainage onset several weeks ago and has worsened over the past few days. She reports associated pain to her left upper back that radiates to her left ear and left lateral neck and fever of 102 3 days ago. Pt has tried organic oils and warm compresses with no relief. She states that she has had multiple I&Ds in the area and the last I&D was November 2016. She reports NKDA. No headache, visual changes, lightheadedness, chills, neck stiffness, CP, SOB, abdominal pain, or n/v, numbness, tingling. Patient denies any recent fall, trauma, injury.    Past Medical History  Diagnosis Date  . Abnormal mammogram 12/00    repeat normal  . Abscess breast 7/80  . Spinal stenosis     MRI LS spine 9/09  . Cervical disc displacement     diagnosed by MRI--sees dr Cyndy Freeze, evaluation by disability determination services for permanent disability 04/25/00  . 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 SPINAL STENOSIS, LUMBAR 06/22/2008  . Breast abscess of female 07/18/2011   Past Surgical History  Procedure Laterality Date  . Abdominal hysterectomy    . Myomectomy     Family History  Problem Relation Age of Onset  . Hypertension Mother   . Hypertension Father   . Depression Sister   . Diabetes Sister   . Stroke Sister   . Parkinsonism  Brother   . Dementia Brother    Social History  Substance Use Topics  . Smoking status: Current Every Day Smoker -- 1.00 packs/day for 30 years    Types: Cigarettes  . Smokeless tobacco: Never Used     Comment: thinking about it  . Alcohol Use: Yes     Comment: 1 x per month   OB History    No data available     Review of Systems  Constitutional: Positive for fever (102). Negative for chills.  HENT: Positive for ear pain.   Gastrointestinal: Negative for nausea, vomiting and abdominal pain.  Skin:       Painful area to left upper back      Allergies  Review of patient's allergies indicates no known allergies.  Home Medications   Prior to Admission medications   Medication Sig Start Date End Date Taking? Authorizing Provider  aspirin 81 MG tablet Take 81 mg by mouth daily.    Historical Provider, MD  atorvastatin (LIPITOR) 40 MG tablet Take 1 tablet (40 mg total) by mouth daily. 06/22/15   Mercy Riding, MD  cyclobenzaprine (FLEXERIL) 10 MG tablet Take 1 tablet (10 mg total) by mouth 3 (three) times daily as needed for muscle spasms. 07/27/14   Elberta Leatherwood, MD  HYDROcodone-acetaminophen (NORCO) 5-325 MG per tablet Take 1 tablet by mouth every 6 (six) hours as needed for moderate pain. 05/28/14  Elberta Leatherwood, MD  HYDROcodone-acetaminophen (NORCO/VICODIN) 5-325 MG tablet Take 1 tablet by mouth every 6 (six) hours as needed. 04/09/15   Tiffany Carlota Raspberry, PA-C  sertraline (ZOLOFT) 50 MG tablet Take 3 tablets (150 mg total) by mouth daily. 06/06/15   Elberta Leatherwood, MD  sulfamethoxazole-trimethoprim (BACTRIM DS,SEPTRA DS) 800-160 MG tablet Take 1 tablet by mouth 2 (two) times daily. 07/30/15 08/06/15  Chesley Noon Jatavion Peaster, PA-C   BP 141/84 mmHg  Pulse 84  Temp(Src) 98.5 F (36.9 C) (Oral)  Resp 16  SpO2 97% Physical Exam  Constitutional: She is oriented to person, place, and time. She appears well-developed and well-nourished.  HENT:  Head: Normocephalic and atraumatic.  Right  Ear: Tympanic membrane and external ear normal.  Left Ear: Tympanic membrane and external ear normal.  Mouth/Throat: Oropharynx is clear and moist. No oropharyngeal exudate.  Eyes: Conjunctivae and EOM are normal. Right eye exhibits no discharge. Left eye exhibits no discharge. No scleral icterus.  Neck: Normal range of motion. Neck supple.  Cardiovascular: Normal rate.   Pulmonary/Chest: Effort normal.  Abdominal: She exhibits no distension.  Lymphadenopathy:    She has no cervical adenopathy.  Neurological: She is alert and oriented to person, place, and time.  Skin: Skin is warm and dry.  1 cm hyperpigmented lesion noted to left upper back with no induration of fluctuence, small amount of purulent drainage able to be expelled with palpitation, No surrounding erythema, swelling, or warmth.   Psychiatric: She has a normal mood and affect.  Nursing note and vitals reviewed.   ED Course  Procedures (including critical care time) DIAGNOSTIC STUDIES: Oxygen Saturation is 97% on RA, normal by my interpretation.    COORDINATION OF CARE: 7:33 PM- Pt advised of plan for treatment and pt agrees. Pt will be placed on antibiotics. Recommended to continue using warm compresses and to follow up with Dermatologist. Advised to return if she develops a fever, redness, or swelling.   MDM   Final diagnoses:  Abscess   BP 141/84 mmHg  Pulse 84  Temp(Src) 98.5 F (36.9 C) (Oral)  Resp 16  SpO2 97%   Patient with skin abscess that is actively draining and not requiring I&D. VSS, afebrile. No signs of cellulitis is surrounding skin.  Due to abscess recurring to the same area will plan to discharge patient home with antibiotics. Encouraged home warm soaks and flushing.  Patient given resource guide to follow up with PCP. Advised patient to schedule a follow-up appointment for recheck and recheck in 2 days.   Evaluation does not show pathology requring ongoing emergent intervention or admission. Pt  is hemodynamically stable and mentating appropriately. Discussed findings/results and plan with patient/guardian, who agrees with plan. All questions answered. Return precautions discussed and outpatient follow up given.    I personally performed the services described in this documentation, which was scribed in my presence. The recorded information has been reviewed and is accurate.    Chesley Noon [place], [place] 07/30/15 1958  Pattricia Boss, MD 08/01/15 1351

## 2015-07-30 NOTE — ED Notes (Signed)
Pt complains of pain and foul odor from an abscess that was drained last year. Pt states she has tried tylenol for pain without relief. Pt states the abscess still drains occasionally.

## 2015-07-30 NOTE — Discharge Instructions (Signed)
Take your medication as prescribed. I recommend continuing to use warm compresses at home. Apply the warm compress for 15-20 minutes 4 times daily. He may take ibuprofen as prescribed over-the-counter as needed for pain relief. Follow-up with the dermatology clinic listed above to schedule an appointment. Return to emergency department if symptoms worsen or new onset of fever, redness, swelling, warmth, worsening drainage.

## 2015-09-05 ENCOUNTER — Other Ambulatory Visit: Payer: Self-pay | Admitting: Family Medicine

## 2015-09-07 ENCOUNTER — Other Ambulatory Visit: Payer: Self-pay | Admitting: Family Medicine

## 2015-09-07 NOTE — Telephone Encounter (Signed)
Already refilled this med earlier this week

## 2015-09-24 ENCOUNTER — Emergency Department (HOSPITAL_COMMUNITY)
Admission: EM | Admit: 2015-09-24 | Discharge: 2015-09-24 | Disposition: A | Discharge status: Home / Self Care | Payer: Medicare Other | Source: Home / Self Care | Attending: Emergency Medicine | Admitting: Emergency Medicine

## 2015-09-24 ENCOUNTER — Encounter (HOSPITAL_COMMUNITY): Payer: Self-pay | Admitting: Emergency Medicine

## 2015-09-24 DIAGNOSIS — Z8739 Personal history of other diseases of the musculoskeletal system and connective tissue: Secondary | ICD-10-CM | POA: Insufficient documentation

## 2015-09-24 DIAGNOSIS — Z8659 Personal history of other mental and behavioral disorders: Secondary | ICD-10-CM | POA: Insufficient documentation

## 2015-09-24 DIAGNOSIS — Z79899 Other long term (current) drug therapy: Secondary | ICD-10-CM | POA: Insufficient documentation

## 2015-09-24 DIAGNOSIS — F17210 Nicotine dependence, cigarettes, uncomplicated: Secondary | ICD-10-CM | POA: Insufficient documentation

## 2015-09-24 DIAGNOSIS — N61 Mastitis without abscess: Secondary | ICD-10-CM | POA: Insufficient documentation

## 2015-09-24 DIAGNOSIS — Z7982 Long term (current) use of aspirin: Secondary | ICD-10-CM | POA: Insufficient documentation

## 2015-09-24 MED ORDER — OXYCODONE-ACETAMINOPHEN 5-325 MG PO TABS: 1.0000 | ORAL_TABLET | ORAL | Status: DC | PRN

## 2015-09-24 MED ORDER — DOXYCYCLINE HYCLATE 100 MG PO TABS
100.0000 mg | ORAL_TABLET | Freq: Once | ORAL | Status: AC
  Administered 2015-09-24: 100 mg via ORAL
  Filled 2015-09-24: qty 1

## 2015-09-24 MED ORDER — OXYCODONE-ACETAMINOPHEN 5-325 MG PO TABS
1.0000 | ORAL_TABLET | Freq: Once | ORAL | Status: AC
Start: 2015-09-24 — End: 2015-09-24
  Administered 2015-09-24: 1 via ORAL
  Filled 2015-09-24: qty 1

## 2015-09-24 MED ORDER — DOXYCYCLINE HYCLATE 100 MG PO CAPS: 100.0000 mg | ORAL_CAPSULE | Freq: Two times a day (BID) | ORAL | Status: DC

## 2015-09-24 NOTE — ED Notes (Signed)
Pt complaint of left breast pain related to abscess; hx of same.

## 2015-09-24 NOTE — ED Notes (Signed)
Patient with swelling and discharge to left breast.  Pain started on Tuesday and has progressively worsened.  Patient reports she has had this condition before and had surgery on the left breast three years ago for an infection of the breast.

## 2015-09-24 NOTE — ED Provider Notes (Signed)
CSN: [id number]     Arrival date & time 09/24/15  1210 History   First MD Initiated Contact with Patient 09/24/15 1223     Chief Complaint  Patient presents with  . Abscess  . Breast Pain     (Consider location/radiation/quality/duration/timing/severity/associated sxs/prior Treatment) HPI Comments: Patient presents with pain and swelling to the left breast. She states it started about 3 days ago and is gradually worsened. She does note some drainage from her nipple. She states she had a fever 3 days ago but feels like it was related to a different illness. She denies any recent fevers. No nausea or vomiting. She denies any other illnesses. She was seen in 2012 for a left breast abscess that ultimately was drained by surgery. She also is followed at the breast center.  Patient is a 65 y.o. female presenting with abscess.  Abscess Associated symptoms: no fatigue, no fever, no headaches, no nausea and no vomiting     Past Medical History  Diagnosis Date  . Abnormal mammogram 12/00    repeat normal  . Abscess breast 7/80  . Spinal stenosis     MRI LS spine 9/09  . Cervical disc displacement     diagnosed by MRI--sees dr Cyndy Freeze, evaluation by disability determination services for permanent disability 04/25/00  . Mental and behavioral problems with learning     verbal IQ 73  . SPINAL STENOSIS, LUMBAR 06/22/2008  . Breast abscess of female 07/18/2011   Past Surgical History  Procedure Laterality Date  . Abdominal hysterectomy    . Myomectomy     Family History  Problem Relation Age of Onset  . Hypertension Mother   . Hypertension Father   . Depression Sister   . Diabetes Sister   . Stroke Sister   . Parkinsonism Brother   . Dementia Brother    Social History  Substance Use Topics  . Smoking status: Current Every Day Smoker -- 1.00 packs/day for 30 years    Types: Cigarettes  . Smokeless tobacco: Never Used     Comment: thinking about it  . Alcohol Use: Yes     Comment: 1 x  per month   OB History    No data available     Review of Systems  Constitutional: Negative for fever, chills, diaphoresis and fatigue.  HENT: Negative for congestion, rhinorrhea and sneezing.   Eyes: Negative.   Respiratory: Negative for cough, chest tightness and shortness of breath.   Cardiovascular: Negative for chest pain and leg swelling.       Left breast pain and swelling  Gastrointestinal: Negative for nausea, vomiting, abdominal pain, diarrhea and blood in stool.  Genitourinary: Negative for frequency, hematuria, flank pain and difficulty urinating.  Musculoskeletal: Negative for back pain and arthralgias.  Skin: Negative for rash.  Neurological: Negative for dizziness, speech difficulty, weakness, numbness and headaches.      Allergies  Review of patient's allergies indicates no known allergies.  Home Medications   Prior to Admission medications   Medication Sig Start Date End Date Taking? Authorizing Provider  aspirin 81 MG tablet Take 81 mg by mouth daily.    Historical Provider, MD  atorvastatin (LIPITOR) 40 MG tablet Take 1 tablet (40 mg total) by mouth daily. 06/22/15   Mercy Riding, MD  cyclobenzaprine (FLEXERIL) 10 MG tablet Take 1 tablet (10 mg total) by mouth 3 (three) times daily as needed for muscle spasms. 07/27/14   Elberta Leatherwood, MD  doxycycline (VIBRAMYCIN) 100 MG  capsule Take 1 capsule (100 mg total) by mouth 2 (two) times daily. One po bid x 10 days 09/24/15   Malvin Johns, MD  HYDROcodone-acetaminophen (NORCO) 5-325 MG per tablet Take 1 tablet by mouth every 6 (six) hours as needed for moderate pain. 05/28/14   Elberta Leatherwood, MD  HYDROcodone-acetaminophen (NORCO/VICODIN) 5-325 MG tablet Take 1 tablet by mouth every 6 (six) hours as needed. 04/09/15   Tiffany Carlota Raspberry, PA-C  oxyCODONE-acetaminophen (PERCOCET) 5-325 MG tablet Take 1-2 tablets by mouth every 4 (four) hours as needed. 09/24/15   Malvin Johns, MD  sertraline (ZOLOFT) 50 MG tablet Take 3 tablets (150  mg total) by mouth daily. 09/06/15   Elberta Leatherwood, MD   BP 132/94 mmHg  Pulse 87  Temp(Src) 97.8 F (36.6 C) (Oral)  Resp 16  Ht 5\' 7"  (1.702 m)  Wt 190 lb (86.183 kg)  BMI 29.75 kg/m2  SpO2 98% Physical Exam  Constitutional: She is oriented to person, place, and time. She appears well-developed and well-nourished.  HENT:  Head: Normocephalic and atraumatic.  Eyes: Pupils are equal, round, and reactive to light.  Neck: Normal range of motion. Neck supple.  Cardiovascular: Normal rate, regular rhythm and normal heart sounds.   Pulmonary/Chest: Effort normal and breath sounds normal. No respiratory distress. She has no wheezes. She has no rales. She exhibits no tenderness.  Abdominal: Soft. Bowel sounds are normal. There is no tenderness. There is no rebound and no guarding.  Musculoskeletal: Normal range of motion. She exhibits no edema.  Patient has mild diffuse swelling of the left nipple and areola as compared to the right. There is no palpable abscess. There is no visible drainage from the nipple. There is minimal amount of surrounding redness.  Lymphadenopathy:    She has no cervical adenopathy.  Neurological: She is alert and oriented to person, place, and time.  Skin: Skin is warm and dry. No rash noted.  Psychiatric: She has a normal mood and affect.    ED Course  Procedures (including critical care time) Labs Review Labs Reviewed - No data to display  Imaging Review No results found. I have personally reviewed and evaluated these images and lab results as part of my medical decision-making.   EKG Interpretation None      MDM   Final diagnoses:  Mastitis    Patient presents with pain and swelling to the left nipple. I feel that this represents a mastitis versus early abscess. I don't palpate any abscesses. She is not systemically ill. I will start her on doxycycline. She has followed up with both [place] [facility] in the breast center in the past. I gave  her referral to both. I advised that she call the [facility] Monday for follow-up on Monday. I advised her return here if she has any worsening symptoms over the weekend. She was also given a prescription for Percocet for pain.    Malvin Johns, MD 09/24/15 1251

## 2015-09-26 ENCOUNTER — Ambulatory Visit (INDEPENDENT_AMBULATORY_CARE_PROVIDER_SITE_OTHER): Payer: Medicare Other | Admitting: Family Medicine

## 2015-09-26 ENCOUNTER — Encounter (HOSPITAL_COMMUNITY): Payer: Self-pay | Admitting: General Practice

## 2015-09-26 ENCOUNTER — Observation Stay (HOSPITAL_COMMUNITY): Payer: Medicare Other

## 2015-09-26 ENCOUNTER — Inpatient Hospital Stay (HOSPITAL_COMMUNITY)
Admission: AD | Admit: 2015-09-26 | Discharge: 2015-09-28 | DRG: 601 | Disposition: A | Discharge status: Home / Self Care | Payer: Medicare Other | Source: Ambulatory Visit | Attending: Urology | Admitting: Urology

## 2015-09-26 VITALS — BP 134/77 | HR 91 | Temp 98.6°F | Wt 218.0 lb

## 2015-09-26 DIAGNOSIS — N611 Abscess of the breast and nipple: Secondary | ICD-10-CM | POA: Diagnosis not present

## 2015-09-26 DIAGNOSIS — N644 Mastodynia: Secondary | ICD-10-CM | POA: Diagnosis not present

## 2015-09-26 DIAGNOSIS — K589 Irritable bowel syndrome without diarrhea: Secondary | ICD-10-CM | POA: Diagnosis present

## 2015-09-26 DIAGNOSIS — F329 Major depressive disorder, single episode, unspecified: Secondary | ICD-10-CM | POA: Diagnosis present

## 2015-09-26 DIAGNOSIS — E119 Type 2 diabetes mellitus without complications: Secondary | ICD-10-CM | POA: Diagnosis present

## 2015-09-26 DIAGNOSIS — R7303 Prediabetes: Secondary | ICD-10-CM | POA: Diagnosis present

## 2015-09-26 DIAGNOSIS — Z720 Tobacco use: Secondary | ICD-10-CM

## 2015-09-26 DIAGNOSIS — N61 Mastitis without abscess: Secondary | ICD-10-CM

## 2015-09-26 DIAGNOSIS — Z79899 Other long term (current) drug therapy: Secondary | ICD-10-CM

## 2015-09-26 DIAGNOSIS — Z6831 Body mass index (BMI) 31.0-31.9, adult: Secondary | ICD-10-CM

## 2015-09-26 DIAGNOSIS — E860 Dehydration: Secondary | ICD-10-CM | POA: Diagnosis present

## 2015-09-26 DIAGNOSIS — F17210 Nicotine dependence, cigarettes, uncomplicated: Secondary | ICD-10-CM | POA: Diagnosis present

## 2015-09-26 DIAGNOSIS — G4700 Insomnia, unspecified: Secondary | ICD-10-CM | POA: Diagnosis present

## 2015-09-26 DIAGNOSIS — E785 Hyperlipidemia, unspecified: Secondary | ICD-10-CM | POA: Diagnosis present

## 2015-09-26 DIAGNOSIS — E669 Obesity, unspecified: Secondary | ICD-10-CM | POA: Diagnosis present

## 2015-09-26 DIAGNOSIS — Z7982 Long term (current) use of aspirin: Secondary | ICD-10-CM

## 2015-09-26 DIAGNOSIS — Z87891 Personal history of nicotine dependence: Secondary | ICD-10-CM

## 2015-09-26 LAB — CBC
HCT: 38.8 % (ref 36.0–46.0)
Hemoglobin: 13.1 g/dL (ref 12.0–15.0)
MCH: 31.6 pg (ref 26.0–34.0)
MCHC: 33.8 g/dL (ref 30.0–36.0)
MCV: 93.7 fL (ref 78.0–100.0)
PLATELETS: 247 10*3/uL (ref 150–400)
RBC: 4.14 MIL/uL (ref 3.87–5.11)
RDW: 13.6 % (ref 11.5–15.5)
WBC: 11.5 10*3/uL — AB (ref 4.0–10.5)

## 2015-09-26 LAB — BASIC METABOLIC PANEL
Anion gap: 9 (ref 5–15)
BUN: 11 mg/dL (ref 6–20)
CO2: 24 mmol/L (ref 22–32)
CREATININE: 0.84 mg/dL (ref 0.44–1.00)
Calcium: 9.2 mg/dL (ref 8.9–10.3)
Chloride: 106 mmol/L (ref 101–111)
GFR calc non Af Amer: >60 mL/min (ref >60)
Glucose, Bld: 141 mg/dL — ABNORMAL HIGH (ref 65–99)
Potassium: 3.6 mmol/L (ref 3.5–5.1)
SODIUM: 139 mmol/L (ref 135–145)

## 2015-09-26 MED ORDER — ONDANSETRON HCL 4 MG PO TABS
4.0000 mg | ORAL_TABLET | Freq: Four times a day (QID) | ORAL | Status: DC | PRN
Start: 2015-09-26 — End: 2015-09-28

## 2015-09-26 MED ORDER — DOXYCYCLINE HYCLATE 100 MG IV SOLR
100.0000 mg | Freq: Two times a day (BID) | INTRAVENOUS | Status: DC
  Administered 2015-09-26: 100 mg via INTRAVENOUS
  Filled 2015-09-26 (×2): qty 100

## 2015-09-26 MED ORDER — OXYCODONE-ACETAMINOPHEN 5-325 MG PO TABS
1.0000 | ORAL_TABLET | ORAL | Status: DC | PRN
  Administered 2015-09-26 – 2015-09-27 (×3): 2 via ORAL
  Filled 2015-09-26 (×3): qty 2

## 2015-09-26 MED ORDER — SODIUM CHLORIDE 0.45 % IV SOLN
INTRAVENOUS | Status: DC
  Administered 2015-09-26: 20:00:00 via INTRAVENOUS

## 2015-09-26 MED ORDER — ONDANSETRON HCL 4 MG/2ML IJ SOLN: 4.0000 mg | Freq: Four times a day (QID) | INTRAMUSCULAR | Status: DC | PRN

## 2015-09-26 MED ORDER — VANCOMYCIN HCL 10 G IV SOLR
1500.0000 mg | Freq: Two times a day (BID) | INTRAVENOUS | Status: DC
  Administered 2015-09-27 – 2015-09-28 (×3): 1500 mg via INTRAVENOUS
  Filled 2015-09-26 (×6): qty 1500

## 2015-09-26 MED ORDER — SERTRALINE HCL 50 MG PO TABS
50.0000 mg | ORAL_TABLET | Freq: Every day | ORAL | Status: DC
  Administered 2015-09-26 – 2015-09-27 (×2): 50 mg via ORAL
  Filled 2015-09-26 (×2): qty 1

## 2015-09-26 MED ORDER — VANCOMYCIN HCL 10 G IV SOLR
2000.0000 mg | Freq: Once | INTRAVENOUS | Status: AC
  Administered 2015-09-26: 2000 mg via INTRAVENOUS
  Filled 2015-09-26: qty 2000

## 2015-09-26 MED ORDER — ENOXAPARIN SODIUM 40 MG/0.4ML ~~LOC~~ SOLN
40.0000 mg | SUBCUTANEOUS | Status: DC
  Administered 2015-09-27: 40 mg via SUBCUTANEOUS
  Filled 2015-09-26: qty 0.4

## 2015-09-26 MED ORDER — ASPIRIN EC 81 MG PO TBEC
81.0000 mg | DELAYED_RELEASE_TABLET | Freq: Every day | ORAL | Status: DC
  Administered 2015-09-27 – 2015-09-28 (×2): 81 mg via ORAL
  Filled 2015-09-26 (×2): qty 1

## 2015-09-26 MED ORDER — SERTRALINE HCL 100 MG PO TABS
100.0000 mg | ORAL_TABLET | Freq: Every morning | ORAL | Status: DC
  Administered 2015-09-27 – 2015-09-28 (×2): 100 mg via ORAL
  Filled 2015-09-26 (×2): qty 1

## 2015-09-26 MED ORDER — ATORVASTATIN CALCIUM 40 MG PO TABS
40.0000 mg | ORAL_TABLET | Freq: Every day | ORAL | Status: DC
  Administered 2015-09-26 – 2015-09-28 (×3): 40 mg via ORAL
  Filled 2015-09-26 (×2): qty 1

## 2015-09-26 MED ORDER — SODIUM CHLORIDE 0.9 % IV SOLN
INTRAVENOUS | Status: AC
  Administered 2015-09-26: 16:00:00 via INTRAVENOUS

## 2015-09-26 MED ORDER — SERTRALINE HCL 50 MG PO TABS: 150.0000 mg | ORAL_TABLET | Freq: Every day | ORAL | Status: DC

## 2015-09-26 MED ORDER — MORPHINE SULFATE (PF) 2 MG/ML IV SOLN: 2.0000 mg | INTRAVENOUS | Status: DC | PRN

## 2015-09-26 NOTE — Progress Notes (Addendum)
Date of Visit: 09/26/2015   HPI:  Patient presents for a same day appointment to discuss ED follow up for mastitis.  Patient reports symptoms originally began between Wednesday and Friday of last week. Saturday things got particularly bad, and she was seen in ED on Saturday 4/1 for breast pain. Diagnosed with mastitis. No abscess palpable at that time. Was started on doxycycline. When symptoms initially started she had some purulent drainage from her nipple, that was foul smelling.  Since ED visit, has had continued pain in left breast. Also having fever/chills. Fever to 101.9 yesterday. Thinks it's getting worse and not better. Has been doing warm compresses and thinks breast is bigger, as if abscess has formed.  Of note patient has history of surgical intervention for abscess in left breast in 2012. Last mammogram was in December 2016 and was normal, birads 1, breast density category B.  ROS: See HPI  [place]: history of hyperlipidemia, depression/anxiety, IBS, tobacco use, prediabetes, lumbar spinal stenosis, prior surgery for left breast abscess.  PHYSICAL EXAM: BP 134/77 mmHg  Pulse 91  Temp(Src) 98.6 F (37 C) (Oral)  Wt 218 lb (98.884 kg) Gen: NAD, pleasant, cooperative HEENT: NCAT Breasts: L breast with erythema overlying the nipple, extending to the 4-6 oclock position as well. The area is quite tender. Prior surgical scar present. No nipple drainage. 4-5cm of palpable fullness beneath nipple concerning for underlying abscess formation. Scattered seborrheic keratoses on breast, including on nipple. No axillary lymphadenopathy appreciated. R breast and axilla normal.  ASSESSMENT/PLAN:  1. Mastitis - concern for underlying abscess with today's exam findings. Has effectively failed outpatient management with oral antibiotics. Discussed options for treatment including admission to hospital versus attempting to contact surgery for possible outpatient intervention, and patient would  prefer admission. Will admit to medsurg bed, IV antibiotics, breast ultrasound, likely surgical consultation if breast abscess is indeed confirmed. Patient agreeable to this plan. Contacted on call resident Dr. Bonner Puna who will coordinate admission.  FOLLOW UP: Admitting directly to the hospital.  Delorse Limber. Ardelia Mems, [place]   Addendum - no inpatient beds were available at time of visit but were expected to be ready within a few hours. Discussed options with patient for staying here in clinic or going to get food/going home for a short while. She and her sister were hungry and wanted to go get lunch. We will call them as soon as a bed is available. Patient well appearing and ambulatory prior to leaving clinic.

## 2015-09-26 NOTE — Progress Notes (Signed)
Pharmacy Antibiotic Note Jenna Wong is a 65 y.o. female admitted on 09/26/2015 with L breast abscess/cellulitis that failed to respond to outpatient doxycycline.  Pharmacy has been consulted for Vancomycin dosing.  Plan: 1. Vancomycin 2000 mg loading dose x 1 followed by 1500 mg q 12 hours starting on 4/4 am 2. If continued will obtain level at [facility]; goal 15 - 20 based on indication  3. SCr Q 72H   Temp (24hrs), Avg:98.3 F (36.8 C), Min:97.9 F (36.6 C), Max:98.6 F (37 C)   Recent Labs Lab 09/26/15 1601  WBC 11.5*  CREATININE 0.84    Estimated Creatinine Clearance: 83.9 mL/min (by C-G formula based on Cr of 0.84).    No Known Allergies  Antimicrobials this admission: 4/3 Vancomycin  >>   Dose adjustments this admission: n/a  Microbiology results: 4/3 BCx: px   Thank you for allowing pharmacy to be a part of this patient's care.  Vincenza Hews, PharmD, BCPS 09/26/2015, 5:36 PM Pager: [phone]

## 2015-09-26 NOTE — H&P (Signed)
[facility] Admission History and Physical Service Pager: [phone]  Patient name: Jenna Wong Medical record number: [id number] Date of birth: 1951/03/06 Age: 65 y.o. Gender: female  Primary Care Provider: Georges Lynch, MD Consultants: General surgery Code Status: Full  Chief Complaint: Left breast pain and nipple drainage  Assessment and Plan: Jenna Wong is a 65 y.o. female with a history of recurrent breast abscess presenting with left breast pain, redness, swelling, and purulent nipple discharge.  Acute left breast mastitis: Systemic symptoms without sepsis. Failed outpatient management with po doxycycline. With sizeable subareolar induration, difficult to determine presence of abscess. Recurrent (estimates 7 surgeries on breasts for similar presentation). Negative (Bi-RADS 1) screening mammography 05/30/2016. - U/S to evaluate for abscess - Will need surgical consultation, will await U/S results - Blood culture x2 - Doxycycline IV (4/3 >> ) duration dependent on further management - IVFs, antiemetics for very mild dehydration due to poor po - Percocet 5/325mg  q4h prn severe pain - Warm compresses  Hyperlipidemia: - Continue atorvastatin  Diabetes: Hb A1c 6.9% on 06/28/2015.  - Will need to be addressed a follow up.   Depression: - Continue zoloft  Tobacco use: ~ 30 pack-year history.  - Cessation counseling. Counseled on predisposition to recurrent infections  FEN/GI: NS @125cc /hr x 8 hrs, then 1/2NS @125cc /hr, regular diet Prophylaxis: Lovenox  Disposition: Admit to [facility], med-surg, Dr. McDiarmid attending.   History of Present Illness:  Jenna Wong is a 65 y.o. female presenting with left breast pain.   She reports 6 days of worsening left breast pain and malodorous purulent nipple discharge. The pain worsened to the point that she presented to the ED on 4/1, Dx with mastitis without abscess and given doxycycline which she has taken as  directed without any improvement in symptoms. She has had fevers (Tmax 101.50F yesterday), chills, poor appetite, nausea without vomiting. Nipple drainage has stopped, but the area around the nipple has gotten red and warm with severe pain. Percocet has helped the pain as have warm compresses.   She's had several (estimates about 7) surgeries for breast infections in the past, most recently in 2012. She's had a couple of episodes of infections that drained on their own since that time, but none that required antibiotics. Her last screening mammogram was in December, BiRADS 1.   Review Of Systems: Per HPI with the following additions: No unintentional weight loss. No dysuria, urinary frequency/urgency, abd pain, SOB, CP, palpitations.  Otherwise the remainder of the systems were negative.  Patient Active Problem List   Diagnosis Date Noted  . Sebaceous cyst 02/19/2014  . Pre-diabetes 02/15/2012  . Irritable bowel syndrome (IBS) 10/10/2011  . Personal history of tobacco use 12/18/2010  . Depression with anxiety 04/27/2009  . Hyperlipidemia 12/13/2008  . SPINAL STENOSIS, LUMBAR 06/22/2008  . INSOMNIA, PERSISTENT 01/13/2007  . OBESITY, NOS 08/22/2006  . BACK PAIN, LOW 08/22/2006    Past Medical History: Past Medical History  Diagnosis Date  . Abnormal mammogram 12/00    repeat normal  . Abscess breast 7/80  . Spinal stenosis     MRI LS spine 9/09  . Cervical disc displacement     diagnosed by MRI--sees dr Cyndy Freeze, evaluation by disability determination services for permanent disability 04/25/00  . 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 SPINAL STENOSIS, LUMBAR 06/22/2008  . Breast abscess of female 07/18/2011    Past Surgical History: Past Surgical History  Procedure Laterality Date  . Abdominal hysterectomy    . Myomectomy      Social History: Social History  Substance Use Topics  . Smoking status: Current Every Day Smoker -- 1.00 packs/day for 30  years    Types: Cigarettes  . Smokeless tobacco: Never Used     Comment: thinking about it  . Alcohol Use: Yes     Comment: 1 x per month   Additional social history: ~ 30 pack year history, social (less than 2 drinks per day on about 1 - 2 weekends per month) EtOH, no illicit drugs.  Please also refer to relevant sections of EMR.  Family History: Family History  Problem Relation Age of Onset  . Hypertension Mother   . Hypertension Father   . Depression Sister   . Diabetes Sister   . Stroke Sister   . Parkinsonism Brother   . Dementia Brother   Denies breast CA  Allergies and Medications: No Known Allergies No current facility-administered medications on file prior to encounter.   Current Outpatient Prescriptions on File Prior to Encounter  Medication Sig Dispense Refill  . aspirin 81 MG tablet Take 81 mg by mouth daily.    Marland Kitchen atorvastatin (LIPITOR) 40 MG tablet Take 1 tablet (40 mg total) by mouth daily. 90 tablet 2  . cyclobenzaprine (FLEXERIL) 10 MG tablet Take 1 tablet (10 mg total) by mouth 3 (three) times daily as needed for muscle spasms. 30 tablet 2  . doxycycline (VIBRAMYCIN) 100 MG capsule Take 1 capsule (100 mg total) by mouth 2 (two) times daily. One po bid x 10 days 20 capsule 0  . HYDROcodone-acetaminophen (NORCO) 5-325 MG per tablet Take 1 tablet by mouth every 6 (six) hours as needed for moderate pain. 60 tablet 0  . HYDROcodone-acetaminophen (NORCO/VICODIN) 5-325 MG tablet Take 1 tablet by mouth every 6 (six) hours as needed. 6 tablet 0  . oxyCODONE-acetaminophen (PERCOCET) 5-325 MG tablet Take 1-2 tablets by mouth every 4 (four) hours as needed. 15 tablet 0  . sertraline (ZOLOFT) 50 MG tablet Take 3 tablets (150 mg total) by mouth daily. 90 tablet 5    Objective: Clinic vitals: BP 134/77 mmHg  Pulse 91  Temp(Src) 98.6 F (37 C) (Oral)  Wt 218 lb (98.884 kg) Exam: General: Pleasant 65yo female laying on clinic table in pain but no distress Eyes:  Normal ENTM: Tacky mucous membranes, oropharynx clear Neck: Supple Cardiovascular: Regular rate, no murmur, rub or gallop, no JVD or LE edema Respiratory: Nonlabored, clear to auscultation Abdomen: Soft, NT, ND, +BS MSK: Normal muscle tone and bulk Skin: Other than above, no other rashes, ulcers, wounds Neuro: Alert and oriented, no focal deficits in strength or sensation, speech normal, gait slow but normal Psych: Euthymic, appropriately dressed with broad affect, good eye contact, linear thought process.  Labs and Imaging: None  Patrecia Pour, MD 09/26/2015, 12:25 PM PGY-3, Agency Intern pager: [phone], text pages welcome

## 2015-09-26 NOTE — Consult Note (Signed)
Reason for Consult:  Recurrent left breast mastitis Referring Physician: Dr. Chrisandra Netters  Jenna Wong is an 65 y.o. female.  HPI: Pt seen 07/30/15 in ED with drainage from left breast, foul odor, fever up to 102, with hx of prior hx of I&D.  She was discharged home with antibiotics and instructions to follow up with PCP.  She was seen again on 09/24/15 with same issue and was to go to the Breast center today for follow up.she was seen and admitted on 09/26/15 by the Teaching Service with ongoing symptoms; she is afebrile, VSS, currently labs and Ultrasound of the breast are pending. She reports prior I&D back in 2012 for the same thing.  We are ask to see.  Past Medical History  Diagnosis Date  . Abnormal mammogram 12/00    repeat normal  . Abscess breast 7/80  . Spinal stenosis     MRI LS spine 9/09  . Cervical disc displacement     diagnosed by MRI--sees dr Cyndy Freeze, evaluation by disability determination services for permanent disability 04/25/00  . Mental and behavioral problems with learning     verbal IQ 34  . SPINAL STENOSIS, LUMBAR 06/22/2008  . Breast abscess of female 07/18/2011    Past Surgical History  Procedure Laterality Date  . Abdominal hysterectomy    . Myomectomy      Family History  Problem Relation Age of Onset  . Hypertension Mother   . Hypertension Father   . Depression Sister   . Diabetes Sister   . Stroke Sister   . Parkinsonism Brother   . Dementia Brother     Social History:  reports that she has been smoking Cigarettes.  She has a 30 pack-year smoking history. She has never used smokeless tobacco. She reports that she drinks alcohol. She reports that she does not use illicit drugs. Tobacco:  1PPD x 30 years ETOH:  Social Drugs:   None    Allergies: No Known Allergies  Medications:  Prior to Admission:  Prescriptions prior to admission  Medication Sig Dispense Refill Last Dose  . aspirin 81 MG tablet Take 81 mg by mouth daily.   04/08/2015 at  Unknown time  . atorvastatin (LIPITOR) 40 MG tablet Take 1 tablet (40 mg total) by mouth daily. 90 tablet 2   . cyclobenzaprine (FLEXERIL) 10 MG tablet Take 1 tablet (10 mg total) by mouth 3 (three) times daily as needed for muscle spasms. 30 tablet 2 Past Week at Unknown time  . doxycycline (VIBRAMYCIN) 100 MG capsule Take 1 capsule (100 mg total) by mouth 2 (two) times daily. One po bid x 10 days 20 capsule 0   . HYDROcodone-acetaminophen (NORCO) 5-325 MG per tablet Take 1 tablet by mouth every 6 (six) hours as needed for moderate pain. 60 tablet 0 Past Month at Unknown time  . HYDROcodone-acetaminophen (NORCO/VICODIN) 5-325 MG tablet Take 1 tablet by mouth every 6 (six) hours as needed. 6 tablet 0   . oxyCODONE-acetaminophen (PERCOCET) 5-325 MG tablet Take 1-2 tablets by mouth every 4 (four) hours as needed. 15 tablet 0   . sertraline (ZOLOFT) 50 MG tablet Take 3 tablets (150 mg total) by mouth daily. 90 tablet 5    Scheduled: . aspirin EC  81 mg Oral Daily  . atorvastatin  40 mg Oral Daily  . doxycycline (VIBRAMYCIN) IV  100 mg Intravenous Q12H  . enoxaparin (LOVENOX) injection  40 mg Subcutaneous Q24H  . sertraline  150 mg Oral Daily  Continuous: . sodium chloride    . sodium chloride     [id number] injection, ondansetron **OR** ondansetron (ZOFRAN) IV, oxyCODONE-acetaminophen Anti-infectives    Start     Dose/Rate Route Frequency Ordered Stop   09/26/15 1600  doxycycline (VIBRAMYCIN) 100 mg in dextrose 5 % 250 mL IVPB     100 mg 125 mL/hr over 120 Minutes Intravenous Every 12 hours 09/26/15 1453        No results found for this or any previous visit (from the past 48 hour(s)).  No results found.  Review of Systems  Constitutional: Positive for fever and chills. Negative for weight loss.  HENT: Negative.   Eyes: Negative.   Respiratory: Negative.   Cardiovascular: Negative.   Gastrointestinal: Negative.   Genitourinary: Negative.   Musculoskeletal: Negative.   Skin:  Negative.   Neurological: Negative.   Endo/Heme/Allergies: Negative.   Psychiatric/Behavioral: Positive for depression. The patient is nervous/anxious.    Blood pressure 130/69, pulse 87, temperature 97.9 F (36.6 C), temperature source Oral, resp. rate 18, SpO2 99 %. Physical Exam  Constitutional: She is oriented to person, place, and time. She appears well-developed and well-nourished. No distress.  HENT:  Head: Normocephalic and atraumatic.  Nose: Nose normal.  Eyes: Right eye exhibits no discharge. Left eye exhibits no discharge. No scleral icterus.  Neck: No JVD present. No tracheal deviation present. No thyromegaly present.  Cardiovascular: Normal rate, regular rhythm and normal heart sounds.   No murmur heard. Respiratory: Effort normal and breath sounds normal. No respiratory distress. She has no wheezes. She has no rales. She exhibits no tenderness.    GI: Soft. Bowel sounds are normal. She exhibits no distension and no mass. There is no tenderness. There is no rebound and no guarding.  Musculoskeletal: She exhibits no edema.  Lymphadenopathy:    She has no cervical adenopathy.  Neurological: She is alert and oriented to person, place, and time. No cranial nerve deficit.  Skin: Skin is warm and dry. She is not diaphoretic. There is erythema.  Psychiatric: She has a normal mood and affect. Her behavior is normal. Judgment and thought content normal.    Assessment/Plan: Left breast mastitis involving the areola Prior I&D 2012 Dr. Zenia Resides  Plan;  She is on antibiotics,  Ultrasound is ordered, along with labs.  We will follow with you and make her NPO after MN for possible I&D if needed tomorrow.  [name] 09/26/2015, 3:24 PM

## 2015-09-27 ENCOUNTER — Encounter (HOSPITAL_COMMUNITY): Payer: Self-pay | Admitting: General Surgery

## 2015-09-27 ENCOUNTER — Observation Stay (HOSPITAL_COMMUNITY): Payer: Medicare Other

## 2015-09-27 DIAGNOSIS — Z7982 Long term (current) use of aspirin: Secondary | ICD-10-CM | POA: Diagnosis not present

## 2015-09-27 DIAGNOSIS — N644 Mastodynia: Secondary | ICD-10-CM | POA: Diagnosis present

## 2015-09-27 DIAGNOSIS — F329 Major depressive disorder, single episode, unspecified: Secondary | ICD-10-CM | POA: Diagnosis present

## 2015-09-27 DIAGNOSIS — N611 Abscess of the breast and nipple: Principal | ICD-10-CM | POA: Insufficient documentation

## 2015-09-27 DIAGNOSIS — K589 Irritable bowel syndrome without diarrhea: Secondary | ICD-10-CM | POA: Diagnosis present

## 2015-09-27 DIAGNOSIS — G4700 Insomnia, unspecified: Secondary | ICD-10-CM | POA: Diagnosis present

## 2015-09-27 DIAGNOSIS — F17210 Nicotine dependence, cigarettes, uncomplicated: Secondary | ICD-10-CM | POA: Diagnosis present

## 2015-09-27 DIAGNOSIS — E860 Dehydration: Secondary | ICD-10-CM | POA: Diagnosis present

## 2015-09-27 DIAGNOSIS — Z6831 Body mass index (BMI) 31.0-31.9, adult: Secondary | ICD-10-CM | POA: Diagnosis not present

## 2015-09-27 DIAGNOSIS — E785 Hyperlipidemia, unspecified: Secondary | ICD-10-CM | POA: Diagnosis present

## 2015-09-27 DIAGNOSIS — R7303 Prediabetes: Secondary | ICD-10-CM | POA: Diagnosis present

## 2015-09-27 DIAGNOSIS — Z79899 Other long term (current) drug therapy: Secondary | ICD-10-CM | POA: Diagnosis not present

## 2015-09-27 DIAGNOSIS — E669 Obesity, unspecified: Secondary | ICD-10-CM | POA: Diagnosis present

## 2015-09-27 DIAGNOSIS — N61 Mastitis without abscess: Secondary | ICD-10-CM

## 2015-09-27 DIAGNOSIS — E119 Type 2 diabetes mellitus without complications: Secondary | ICD-10-CM | POA: Diagnosis present

## 2015-09-27 LAB — BASIC METABOLIC PANEL
Anion gap: 10 (ref 5–15)
BUN: 10 mg/dL (ref 6–20)
CHLORIDE: 104 mmol/L (ref 101–111)
CO2: 24 mmol/L (ref 22–32)
CREATININE: 0.84 mg/dL (ref 0.44–1.00)
Calcium: 8.5 mg/dL — ABNORMAL LOW (ref 8.9–10.3)
GFR calc Af Amer: >60 mL/min (ref >60)
GFR calc non Af Amer: >60 mL/min (ref >60)
Glucose, Bld: 183 mg/dL — ABNORMAL HIGH (ref 65–99)
POTASSIUM: 3.8 mmol/L (ref 3.5–5.1)
Sodium: 138 mmol/L (ref 135–145)

## 2015-09-27 MED ORDER — ACETAMINOPHEN 325 MG PO TABS
650.0000 mg | ORAL_TABLET | Freq: Four times a day (QID) | ORAL | Status: DC
  Administered 2015-09-27 – 2015-09-28 (×4): 650 mg via ORAL
  Filled 2015-09-27 (×4): qty 2

## 2015-09-27 MED ORDER — OXYCODONE HCL 5 MG PO TABS
5.0000 mg | ORAL_TABLET | ORAL | Status: DC | PRN
  Administered 2015-09-27 – 2015-09-28 (×4): 5 mg via ORAL
  Filled 2015-09-27 (×5): qty 1

## 2015-09-27 MED ORDER — LIDOCAINE HCL 1 % IJ SOLN
INTRAMUSCULAR | Status: AC
  Filled 2015-09-27: qty 20

## 2015-09-27 NOTE — Progress Notes (Signed)
Patient ID: Jenna Wong, female   DOB: 1951-05-27, 65 y.o.   MRN: [id number]     [facility]      [address] [place], [place] [address]    Phone: [phone] FAX: [phone]     Subjective: Afebrile.  Less sore today.  Delayed entry, pt seen early this morning.  Objective:  Vital signs:  Filed Vitals:   09/27/15 0138 09/27/15 0729 09/27/15 1100 09/27/15 1305  BP: 110/62 124/61  126/76  Pulse: 74 73  71  Temp: 98.1 F (36.7 C) 97.7 F (36.5 C)  98.4 F (36.9 C)  TempSrc: Oral Oral  Oral  Resp: _0 Height:   _1  (1.702 m)   Weight:   91.173 kg (201 lb)   SpO2: 97% 98%  97%    Last BM Date: 09/26/15  Intake/Output   Yesterday:  04/03 0701 - 04/04 0700 In: 1126.3 [I.V.:1126.3] Out: -  This shift:    I/O last 3 completed shifts: In: 1126.3 [I.V.:1126.3] Out: -     Physical Exam: General: Pt awake/alert/oriented x4 in no acute distress  Skin: left subareolar area of fluctuance, no significant erythema.    Problem List:   Principal Problem:   Acute mastitis of left breast Active Problems:   Hyperlipidemia   Personal history of tobacco use   Mastitis, left, acute    Results:   Labs: Results for orders placed or performed during the hospital encounter of 09/26/15 (from the past 48 hour(s))  CBC     Status: Abnormal   Collection Time: 09/26/15  4:01 PM  Result Value Ref Range   WBC 11.5 (H) 4.0 - 10.5 K/uL   RBC 4.14 3.87 - 5.11 MIL/uL   Hemoglobin 13.1 12.0 - 15.0 g/dL   HCT 38.8 36.0 - 46.0 %   MCV 93.7 78.0 - 100.0 fL   MCH 31.6 26.0 - 34.0 pg   MCHC 33.8 30.0 - 36.0 g/dL   RDW 13.6 11.5 - 15.5 %   Platelets 247 150 - 400 K/uL  Basic metabolic panel     Status: Abnormal   Collection Time: 09/26/15  4:01 PM  Result Value Ref Range   Sodium 139 135 - 145 mmol/L   Potassium 3.6 3.5 - 5.1 mmol/L   Chloride 106 101 - 111 mmol/L   CO2 24 22 - 32 mmol/L   Glucose, Bld 141 (H) 65 - 99  mg/dL   BUN 11 6 - 20 mg/dL   Creatinine, Ser 0.84 0.44 - 1.00 mg/dL   Calcium 9.2 8.9 - 10.3 mg/dL   GFR calc non Af Amer >60 >60 mL/min   GFR calc Af Amer >60 >60 mL/min    Comment: (NOTE) The eGFR has been calculated using the CKD EPI equation. This calculation has not been validated in all clinical situations. eGFR's persistently <60 mL/min signify possible Chronic Kidney Disease.    Anion gap 9 5 - 15  Culture, blood (routine x 2)     Status: None (Preliminary result)   Collection Time: 09/26/15  4:05 PM  Result Value Ref Range   Specimen Description BLOOD RIGHT ANTECUBITAL    Special Requests BOTTLES DRAWN AEROBIC AND ANAEROBIC 10CC    Culture NO GROWTH < 24 HOURS    Report Status PENDING   Culture, blood (routine x 2)     Status: None (Preliminary result)   Collection Time: 09/26/15  4:07 PM  Result Value Ref  Range   Specimen Description BLOOD LEFT ANTECUBITAL    Special Requests BOTTLES DRAWN AEROBIC AND ANAEROBIC 10CC    Culture NO GROWTH < 24 HOURS    Report Status PENDING   Basic metabolic panel     Status: Abnormal   Collection Time: 09/27/15  4:34 AM  Result Value Ref Range   Sodium 138 135 - 145 mmol/L   Potassium 3.8 3.5 - 5.1 mmol/L   Chloride 104 101 - 111 mmol/L   CO2 24 22 - 32 mmol/L   Glucose, Bld 183 (H) 65 - 99 mg/dL   BUN 10 6 - 20 mg/dL   Creatinine, Ser 0.84 0.44 - 1.00 mg/dL   Calcium 8.5 (L) 8.9 - 10.3 mg/dL   GFR calc non Af Amer >60 >60 mL/min   GFR calc Af Amer >60 >60 mL/min    Comment: (NOTE) The eGFR has been calculated using the CKD EPI equation. This calculation has not been validated in all clinical situations. eGFR's persistently <60 mL/min signify possible Chronic Kidney Disease.    Anion gap 10 5 - 15    Imaging / Studies: Ir US Guide Bx Asp/drain  09/27/2015  INDICATION: 65 year old female with a history of sub areola abscess. She has been referred for aspiration EXAM: IR ULTRASOUND GUIDANCE MEDICATIONS: The patient is  currently admitted to the hospital and receiving intravenous antibiotics. The antibiotics were administered within an appropriate time frame prior to the initiation of the procedure. ANESTHESIA/SEDATION: None COMPLICATIONS: None PROCEDURE: Informed written consent was obtained from the patient after a thorough discussion of the procedural risks, benefits and alternatives. All questions were addressed. Maximal Sterile Barrier Technique was utilized including caps, mask, sterile gowns, sterile gloves, sterile drape, hand hygiene and skin antiseptic. A timeout was performed prior to the initiation of the procedure. Ultrasound survey was performed of the left breast with images stored and sent to PACs. The patient was then prepped and draped in the usual sterile fashion. The skin and subcutaneous tissues overlying the inflamed skin an fluid collection were generously infiltrated with 1% lidocaine for local anesthesia. A small stab incision was made with an 11 blade scalpel. Using ultrasound guidance, a large border Yueh needle was advanced into the collection of the sub areolar region. Approximately 1-2 cc of purulent fluid aspirated. No significant blood loss encountered. No complications encountered. Sterile bandage was placed. FINDINGS: Ultrasound survey of the left breast, sub areolar region demonstrates a heterogeneously hyperechoic collection, with no significant fluid characteristics. This appears to be mostly phlegmon. Images during the case demonstrate the needle tip within the collection. Approximately 1-2 cc of fluid aspirated. IMPRESSION: Status post ultrasound-guided aspiration of left sub areolar phlegmon. Given the complex nature and high viscosity of the phlegmon, complete decompression with aspiration is unlikely at this time. Sample was sent to the lab for analysis. Signed, Dulcy Fanny. Earleen Newport, DO [facility] Electronically Signed   By: Corrie Mckusick D.O.   On: 09/27/2015 13:16    Medications / Allergies:  Scheduled Meds: . acetaminophen  650 mg Oral Q6H  . aspirin EC  81 mg Oral Daily  . atorvastatin  40 mg Oral Daily  . enoxaparin (LOVENOX) injection  40 mg Subcutaneous Q24H  . lidocaine      . sertraline  100 mg Oral q morning - 10a  . sertraline  50 mg Oral QHS  . vancomycin  1,500 mg Intravenous Q12H   Continuous Infusions: . sodium chloride 125 mL/hr  at 09/26/15 2021   PRN Meds:.morphine injection, ondansetron **OR** ondansetron (ZOFRAN) IV, oxyCODONE  Antibiotics: Anti-infectives    Start     Dose/Rate Route Frequency Ordered Stop   09/27/15 0700  vancomycin (VANCOCIN) 1,500 mg in sodium chloride 0.9 % 500 mL IVPB     1,500 mg 250 mL/hr over 120 Minutes Intravenous Every 12 hours 09/26/15 1733     09/26/15 1745  vancomycin (VANCOCIN) 2,000 mg in sodium chloride 0.9 % 500 mL IVPB     2,000 mg 250 mL/hr over 120 Minutes Intravenous  Once 09/26/15 1732 09/26/15 2234   09/26/15 1600  doxycycline (VIBRAMYCIN) 100 mg in dextrose 5 % 250 mL IVPB  Status:  Discontinued     100 mg 125 mL/hr over 120 Minutes Intravenous Every 12 hours 09/26/15 1453 09/26/15 1732        Assessment/Plan Left breast abscess-consulted IR for aspiration.  2cc of purulent drainage yielded.  Follow cultures.  Continue with antibiotics.  Anticipate she can be discharged home tomorrow with close follow up in our office.' VTE prophylaxis-SCD/lovenox ID-Vanc  Erby Pian, [facility] Pager [phone](7A-4:30P) For consults and floor pages call [phone](7A-4:30P)  09/27/2015 2:42 PM

## 2015-09-27 NOTE — Consult Note (Signed)
Chief Complaint: left breast abscess  Referring Physician:Dr. Rolm Bookbinder  Supervising Physician: Corrie Mckusick  HPI: Jenna Wong is an 65 y.o. female who presented to the [facility] with a left breast abscess.  She was admitted.  An [place] was obtained which revealed an abscess in her left breast.  She has had a prior abscess that was aspirated and then I&D in 2012.  She does smoke.  We have been asked to see the patient for an aspiration.  Past Medical History:  Past Medical History  Diagnosis Date  . Abnormal mammogram 12/00    repeat normal  . Abscess breast 7/80  . Spinal stenosis     MRI LS spine 9/09  . Cervical disc displacement     diagnosed by MRI--sees dr Cyndy Freeze, evaluation by disability determination services for permanent disability 04/25/00  . 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 SPINAL STENOSIS, LUMBAR 06/22/2008  . Breast abscess of female 07/18/2011    Past Surgical History:  Past Surgical History  Procedure Laterality Date  . Abdominal hysterectomy    . Myomectomy      Family History:  Family History  Problem Relation Age of Onset  . Hypertension Mother   . Hypertension Father   . Depression Sister   . Diabetes Sister   . Stroke Sister   . Parkinsonism Brother   . Dementia Brother     Social History:  reports that she has been smoking Cigarettes.  She has a 30 pack-year smoking history. She has never used smokeless tobacco. She reports that she drinks alcohol. She reports that she does not use illicit drugs.  Allergies: No Known Allergies  Medications:   Medication List    ASK your doctor about these medications        aspirin 81 MG tablet  Take 81 mg by mouth daily.     atorvastatin 40 MG tablet  Commonly known as:  LIPITOR  Take 1 tablet (40 mg total) by mouth daily.     doxycycline 100 MG capsule  Commonly known as:  VIBRAMYCIN  Take 1 capsule (100 mg total) by mouth 2 (two) times daily. One po bid x 10  days     oxyCODONE-acetaminophen 5-325 MG tablet  Commonly known as:  PERCOCET  Take 1-2 tablets by mouth every 4 (four) hours as needed.     sertraline 50 MG tablet  Commonly known as:  ZOLOFT  Take 3 tablets (150 mg total) by mouth daily.        Please HPI for pertinent positives, otherwise complete 10 system ROS negative.  Physical Exam: BP 124/61 mmHg  Pulse 73  Temp(Src) 97.7 F (36.5 C) (Oral)  Resp 18  SpO2 98% There is no weight on file to calculate BMI. General: pleasant, obese black female who is laying in bed in NAD Heart: regular, rate, and rhythm.  Normal s1,s2. No obvious murmurs, gallops, or rubs noted.  Palpable radial and pedal pulses bilaterally Lungs: CTAB, no wheezes, rhonchi, or rales noted.  Respiratory effort nonlabored Breast: left breast with some cellulitic changes just inferior and lateral to her nipple and areola.  There is some fluctuance noted around 6 oclock under her nipple. Abd: soft, NT, ND, +BS, no masses, hernias, or organomegaly Psych: A&Ox3 with an appropriate affect.   Labs: Results for orders placed or performed during the hospital encounter of 09/26/15 (from the past 48 hour(s))  CBC  Status: Abnormal   Collection Time: 09/26/15  4:01 PM  Result Value Ref Range   WBC 11.5 (H) 4.0 - 10.5 K/uL   RBC 4.14 3.87 - 5.11 MIL/uL   Hemoglobin 13.1 12.0 - 15.0 g/dL   HCT 38.8 36.0 - 46.0 %   MCV 93.7 78.0 - 100.0 fL   MCH 31.6 26.0 - 34.0 pg   MCHC 33.8 30.0 - 36.0 g/dL   RDW 13.6 11.5 - 15.5 %   Platelets 247 150 - 400 K/uL  Basic metabolic panel     Status: Abnormal   Collection Time: 09/26/15  4:01 PM  Result Value Ref Range   Sodium 139 135 - 145 mmol/L   Potassium 3.6 3.5 - 5.1 mmol/L   Chloride 106 101 - 111 mmol/L   CO2 24 22 - 32 mmol/L   Glucose, Bld 141 (H) 65 - 99 mg/dL   BUN 11 6 - 20 mg/dL   Creatinine, Ser 0.84 0.44 - 1.00 mg/dL   Calcium 9.2 8.9 - 10.3 mg/dL   GFR calc non Af Amer >60 >60 mL/min   GFR calc Af  Amer >60 >60 mL/min    Comment: (NOTE) The eGFR has been calculated using the CKD EPI equation. This calculation has not been validated in all clinical situations. eGFR's persistently <60 mL/min signify possible Chronic Kidney Disease.    Anion gap 9 5 - 15  Culture, blood (routine x 2)     Status: None (Preliminary result)   Collection Time: 09/26/15  4:05 PM  Result Value Ref Range   Specimen Description BLOOD RIGHT ANTECUBITAL    Special Requests BOTTLES DRAWN AEROBIC AND ANAEROBIC 10CC    Culture PENDING    Report Status PENDING   Culture, blood (routine x 2)     Status: None (Preliminary result)   Collection Time: 09/26/15  4:07 PM  Result Value Ref Range   Specimen Description BLOOD LEFT ANTECUBITAL    Special Requests BOTTLES DRAWN AEROBIC AND ANAEROBIC 10CC    Culture PENDING    Report Status PENDING   Basic metabolic panel     Status: Abnormal   Collection Time: 09/27/15  4:34 AM  Result Value Ref Range   Sodium 138 135 - 145 mmol/L   Potassium 3.8 3.5 - 5.1 mmol/L   Chloride 104 101 - 111 mmol/L   CO2 24 22 - 32 mmol/L   Glucose, Bld 183 (H) 65 - 99 mg/dL   BUN 10 6 - 20 mg/dL   Creatinine, Ser 0.84 0.44 - 1.00 mg/dL   Calcium 8.5 (L) 8.9 - 10.3 mg/dL   GFR calc non Af Amer >60 >60 mL/min   GFR calc Af Amer >60 >60 mL/min    Comment: (NOTE) The eGFR has been calculated using the CKD EPI equation. This calculation has not been validated in all clinical situations. eGFR's persistently <60 mL/min signify possible Chronic Kidney Disease.    Anion gap 10 5 - 15    Imaging: No results found.  Assessment/Plan 1. Left breast abscess -will plan for image guided aspiration of her left breast abscess today -no plans for sedation -the procedure was discussed in detail with the patient, including, bleeding, infection, injury to adjacent structures, need for further treatment.  She understands and is agreeable to proceed   Thank you for this interesting consult.  I  greatly enjoyed meeting Jenna Wong and look forward to participating in their care.  A copy of this report was sent to the  requesting provider on this date.  Electronically Signed: Henreitta Cea 09/27/2015, 11:04 AM   I spent a total of 16mnutes   in face to face in clinical consultation, greater than 50% of which was counseling/coordinating care for left breast abscess

## 2015-09-27 NOTE — Progress Notes (Signed)
Family Medicine Teaching Service Daily Progress Note Intern Pager: [phone]  Patient name: Jenna Wong Medical record number: [id number] Date of birth: 11-28-1950 Age: 65 y.o. Gender: female  Primary Care Provider: Georges Lynch, MD Consultants: General Surgery  Code Status: FULL  Pt Overview and Major Events to Date:  4/3: Patient admitted for acute left breast mastitis   Assessment and Plan: Lyne ROZANNE MCMEEKIN is a 65 y.o. female with a history of recurrent breast abscess presenting with left breast pain, redness, swelling, and purulent nipple discharge.  Acute left breast mastitis: Afebrile since admission. Mild leukocytosis to 11.5 at admission. Reports poor pain control; however, per review of MAR patient has not been taking prn pain medications as frequently as they are available.  - U/S to evaluate for abscess performed, read pending  - general surgery consulted, appreciate recs  - Blood culture x2 pending  - changed Doxycycline to Vancomycin (4/3>>)  - IVFs, antiemetics for very mild dehydration due to poor po - Percocet 5/325mg  q4h prn severe pain, morphine 2 mg q3 h prn (patient has received none)  - Warm compresses  Hyperlipidemia: - Continue atorvastatin  Diabetes: Hb A1c 6.9% on 06/28/2015.  - Will need to be addressed a follow up.   Depression: - Continue zoloft  Tobacco use: ~ 30 pack-year history.  - Cessation counseling. Counseled on predisposition to recurrent infections  FEN/GI:  1/2NS @125cc /hr, NPO since MN  Prophylaxis: Lovenox  Disposition: Home pending management of left breast mastitis   Subjective:  Patient reports pain medication "eases off the pain" but she is still uncomfortable. Using warm compresses. Otherwise no complaints.   Objective: Temp:  [97.7 F (36.5 C)-98.7 F (37.1 C)] 97.7 F (36.5 C) (04/04 0729) Pulse Rate:  [73-91] 73 (04/04 0729) Resp:  [18] 18 (04/04 0729) BP: (97-134)/(57-77) 124/61 mmHg (04/04 0729) SpO2:  [93 %-99 %] 98  % (04/04 0729) Weight:  [218 lb (98.884 kg)] 218 lb (98.884 kg) (04/03 1113) Physical Exam: General: WDWN female lying in bed in NAD  Cardiovascular: RRR. No murmur appreciated.  Respiratory: CTAB. Normal WOB.  Abdomen: soft, NTND  Extremities: No LE edema.  Breast: Edema surrounding the nipple/areola of left breast. No drainage present. Erythema and increased warmth to the area. TTP.   Laboratory:  Recent Labs Lab 09/26/15 1601  WBC 11.5*  HGB 13.1  HCT 38.8  PLT 247    Recent Labs Lab 09/26/15 1601 09/27/15 0434  NA 139 138  K 3.6 3.8  CL 106 104  CO2 24 24  BUN 11 10  CREATININE 0.84 0.84  CALCIUM 9.2 8.5*  GLUCOSE 141* 183*     Imaging/Diagnostic Tests: No results found.  Nicolette Bang, DO 09/27/2015, 9:00 AM PGY-1, [place] Intern pager: [phone], text pages welcome

## 2015-09-27 NOTE — Procedures (Signed)
Interventional Radiology Procedure Note  Procedure:  US guided aspiration of left sub-areolar phlegmon.  2-3 cc of purulent fluid aspirated. .  Complications: None Recommendations:  - follow up cultures  - Routine wound care   Signed,  Dulcy Fanny. Earleen Newport, DO

## 2015-09-28 LAB — CBC
HCT: 37.1 % (ref 36.0–46.0)
Hemoglobin: 12.8 g/dL (ref 12.0–15.0)
MCH: 32.3 pg (ref 26.0–34.0)
MCHC: 34.5 g/dL (ref 30.0–36.0)
MCV: 93.7 fL (ref 78.0–100.0)
PLATELETS: 231 10*3/uL (ref 150–400)
RBC: 3.96 MIL/uL (ref 3.87–5.11)
RDW: 13.6 % (ref 11.5–15.5)
WBC: 10.7 10*3/uL — AB (ref 4.0–10.5)

## 2015-09-28 MED ORDER — INSULIN ASPART PROT & ASPART (70-30 MIX) 100 UNIT/ML ~~LOC~~ SUSP: 16.0000 [IU] | Freq: Every day | SUBCUTANEOUS | Status: DC

## 2015-09-28 MED ORDER — INSULIN ASPART PROT & ASPART (70-30 MIX) 100 UNIT/ML ~~LOC~~ SUSP: 32.0000 [IU] | Freq: Every day | SUBCUTANEOUS | Status: DC

## 2015-09-28 MED ORDER — CLINDAMYCIN HCL 300 MG PO CAPS: 300.0000 mg | ORAL_CAPSULE | Freq: Three times a day (TID) | ORAL | Status: DC

## 2015-09-28 MED ORDER — ONDANSETRON HCL 4 MG PO TABS: 4.0000 mg | ORAL_TABLET | Freq: Four times a day (QID) | ORAL | Status: DC | PRN

## 2015-09-28 MED ORDER — CLINDAMYCIN HCL 300 MG PO CAPS
300.0000 mg | ORAL_CAPSULE | Freq: Three times a day (TID) | ORAL | Status: DC
  Administered 2015-09-28: 300 mg via ORAL
  Filled 2015-09-28: qty 1

## 2015-09-28 MED ORDER — OXYCODONE HCL 5 MG PO TABS: 5.0000 mg | ORAL_TABLET | ORAL | Status: DC | PRN

## 2015-09-28 NOTE — Progress Notes (Signed)
Family Medicine Teaching Service Daily Progress Note Intern Pager: [phone]  Patient name: Jenna Wong Medical record number: [id number] Date of birth: Mar 23, 1951 Age: 65 y.o. Gender: female  Primary Care Provider: Georges Lynch, MD Consultants: General Surgery, IR  Code Status: FULL  Pt Overview and Major Events to Date:  4/3: Patient admitted for acute left breast mastitis  4/4: Aspiration of breast abscess performed by IR   Assessment and Plan: Jenna Wong is a 65 y.o. female with a history of recurrent breast abscess presenting with left breast pain, redness, swelling, and purulent nipple discharge.  Acute left breast mastitis: Afebrile since admission. Mild leukocytosis improving to 10.7. Pain better controlled. S/p Aspiration of abscess by IR on 4/4 with 2cc of purulent drainage obtained.  - general surgery consulted, appreciate recs >> will need outpatient f/u  - Blood culture x2 NGTD  - changed Doxycycline to Vancomycin (4/3>>)  - Percocet 5/325mg  q4h prn severe pain, morphine 2 mg q3 h prn (patient has received none)  - Warm compresses -Abscess culture pending, gram stain: moderate gram positive cocci in pairs   Hyperlipidemia: - Continue atorvastatin  Diabetes: Hb A1c 6.9% on 06/28/2015.  - Will need to be addressed a follow up.   Depression: - Continue zoloft  Tobacco use: ~ 30 pack-year history.  - Cessation counseling. Counseled on predisposition to recurrent infections  FEN/GI:  SLIV, Carb modified diet  Prophylaxis: Lovenox  Disposition: Home pending management of left breast mastitis   Subjective:  Reports pain better controlled. Good PO intake. No N/V.  Objective: Temp:  [98.2 F (36.8 C)-98.4 F (36.9 C)] 98.3 F (36.8 C) (04/05 0627) Pulse Rate:  [64-71] 64 (04/05 0627) Resp:  [16-18] 18 (04/05 0627) BP: (108-126)/(57-76) 115/67 mmHg (04/05 0627) SpO2:  [94 %-97 %] 96 % (04/05 0627) Weight:  [201 lb (91.173 kg)] 201 lb (91.173 kg) (04/04  1100) Physical Exam: General: WDWN female lying in bed in NAD  Cardiovascular: RRR. No murmur appreciated.  Respiratory: CTAB. Normal WOB.  Abdomen: soft, NTND  Extremities: No LE edema.  Breast: Bandage in place underneath left areola. Area TTP.   Laboratory:  Recent Labs Lab 09/26/15 1601 09/28/15 0540  WBC 11.5* 10.7*  HGB 13.1 12.8  HCT 38.8 37.1  PLT 247 231    Recent Labs Lab 09/26/15 1601 09/27/15 0434  NA 139 138  K 3.6 3.8  CL 106 104  CO2 24 24  BUN 11 10  CREATININE 0.84 0.84  CALCIUM 9.2 8.5*  GLUCOSE 141* 183*     Imaging/Diagnostic Tests: Ir US Guide Bx Asp/drain  09/27/2015  INDICATION: 65 year old female with a history of sub areola abscess. She has been referred for aspiration EXAM: IR ULTRASOUND GUIDANCE MEDICATIONS: The patient is currently admitted to the hospital and receiving intravenous antibiotics. The antibiotics were administered within an appropriate time frame prior to the initiation of the procedure. ANESTHESIA/SEDATION: None COMPLICATIONS: None PROCEDURE: Informed written consent was obtained from the patient after a thorough discussion of the procedural risks, benefits and alternatives. All questions were addressed. Maximal Sterile Barrier Technique was utilized including caps, mask, sterile gowns, sterile gloves, sterile drape, hand hygiene and skin antiseptic. A timeout was performed prior to the initiation of the procedure. Ultrasound survey was performed of the left breast with images stored and sent to PACs. The patient was then prepped and draped in the usual sterile fashion. The skin and subcutaneous tissues overlying the inflamed skin an fluid collection were generously infiltrated with 1%  lidocaine for local anesthesia. A small stab incision was made with an 11 blade scalpel. Using ultrasound guidance, a large border Yueh needle was advanced into the collection of the sub areolar region. Approximately 1-2 cc of purulent fluid aspirated. No  significant blood loss encountered. No complications encountered. Sterile bandage was placed. FINDINGS: Ultrasound survey of the left breast, sub areolar region demonstrates a heterogeneously hyperechoic collection, with no significant fluid characteristics. This appears to be mostly phlegmon. Images during the case demonstrate the needle tip within the collection. Approximately 1-2 cc of fluid aspirated. IMPRESSION: Status post ultrasound-guided aspiration of left sub areolar phlegmon. Given the complex nature and high viscosity of the phlegmon, complete decompression with aspiration is unlikely at this time. Sample was sent to the lab for analysis. Signed, Dulcy Fanny. Earleen Newport, DO [facility] Radiology Electronically Signed   By: Corrie Mckusick D.O.   On: 09/27/2015 13:16    Nicolette Bang, DO 09/28/2015, 9:24 AM PGY-1, [place] Intern pager: [phone], text pages welcome

## 2015-09-28 NOTE — Discharge Summary (Signed)
[facility] Discharge Summary  Patient name: Jenna Wong Medical record number: [id number] Date of birth: 17-Apr-1951 Age: 65 y.o. Gender: female Date of Admission: 09/26/2015  Date of Discharge: 09/28/2015 Admitting Physician: Leeanne Rio, MD  Primary Care Provider: Georges Lynch, MD Consultants: General Surgery, IR   Indication for Hospitalization: Left Breast Abscess   Discharge Diagnoses/Problem List:  Patient Active Problem List   Diagnosis Date Noted  . Left breast abscess   . Acute mastitis of left breast 09/26/2015  . Mastitis, left, acute 09/26/2015  . Sebaceous cyst 02/19/2014  . Pre-diabetes 02/15/2012  . Irritable bowel syndrome (IBS) 10/10/2011  . Personal history of tobacco use 12/18/2010  . Depression with anxiety 04/27/2009  . Hyperlipidemia 12/13/2008  . SPINAL STENOSIS, LUMBAR 06/22/2008  . INSOMNIA, PERSISTENT 01/13/2007  . OBESITY, NOS 08/22/2006  . BACK PAIN, LOW 08/22/2006    Disposition: Home   Discharge Condition: Stable   Discharge Exam:  General: WDWN female lying in bed in NAD  Cardiovascular: RRR. No murmur appreciated.  Respiratory: CTAB. Normal WOB.  Abdomen: soft, NTND  Extremities: No LE edema.  Breast: Bandage in place underneath left areola. Area TTP.   Brief Hospital Course:  Jenna Wong is 65 y.o. female with PMH of reccurent breast abscesses, HLD, T2DM, depression and tobacco abuse who presented with acute left breast abscess after failure of outpatient management of PO doxycycline. Vital signs were stable and patient had normal screening mammogram in Dec 2016. Patient was started on IV doxycyline and quickly transitioned to IV Vancomycin for Group A beta strep coverage. IR performed aspiration of of abscess on 4/4 with 2 cc purulent drainage obtained. Gram stain with moderate gram positive cocci in pairs. Culture had no growth at discharge. Patient was discharged with PO clindamycin for staph and  strep coverage.   Issues for Follow Up:  1. Patient should take Clindamycin through 4/12 to complete a 10 day course of antibiotics.  2. HgB A1C 6.9% in Jan 2017. Please address at follow up.  3. Continue to counsel on importance of smoking cessation especially given predisposition to recurrent infections.  4. Oxycodone 5 mg #30 given for pain control.  5. General surgery wanted to see patient a week after discharge. Please make sure follow up was successfully scheduled.   Significant Procedures: Aspiration of Left Breast Abscess   Significant Labs and Imaging:   Recent Labs Lab 09/26/15 1601 09/28/15 0540  WBC 11.5* 10.7*  HGB 13.1 12.8  HCT 38.8 37.1  PLT 247 231    Recent Labs Lab 09/26/15 1601 09/27/15 0434  NA 139 138  K 3.6 3.8  CL 106 104  CO2 24 24  GLUCOSE 141* 183*  BUN 11 10  CREATININE 0.84 0.84  CALCIUM 9.2 8.5*    Results/Tests Pending at Time of Discharge: Final Culture of Abscess   Discharge Medications:    Medication List    STOP taking these medications        doxycycline 100 MG capsule  Commonly known as:  VIBRAMYCIN     oxyCODONE-acetaminophen 5-325 MG tablet  Commonly known as:  PERCOCET      TAKE these medications        aspirin 81 MG tablet  Take 81 mg by mouth daily.     atorvastatin 40 MG tablet  Commonly known as:  LIPITOR  Take 1 tablet (40 mg total) by mouth daily.     clindamycin 300 MG capsule  Commonly known  as:  CLEOCIN  Take 1 capsule (300 mg total) by mouth every 8 (eight) hours.     ondansetron 4 MG tablet  Commonly known as:  ZOFRAN  Take 1 tablet (4 mg total) by mouth every 6 (six) hours as needed for nausea.     oxyCODONE 5 MG immediate release tablet  Commonly known as:  Oxy IR/ROXICODONE  Take 1 tablet (5 mg total) by mouth every 4 (four) hours as needed for severe pain.     sertraline 50 MG tablet  Commonly known as:  ZOLOFT  Take 3 tablets (150 mg total) by mouth daily.        Discharge  Instructions: Please refer to Patient Instructions section of EMR for full details.  Patient was counseled important signs and symptoms that should prompt return to medical care, changes in medications, dietary instructions, activity restrictions, and follow up appointments.   Follow-Up Appointments:     Follow-up Information    Call [facility].   Specialty:  General Surgery   Why:  For wound re-check -- they should have you scheduled for next week    Contact information:   [place] [id number] [phone]       Follow up with [place]. Go on 10/04/2015.   Specialty:  Family Medicine   Why:  For Hospital Followup with Dr. Avon Gully at 2:30 pm   Contact information:   [address] [id number] [place] [id number] [place], DO 09/28/2015, 2:17 PM PGY-1, [place]

## 2015-09-28 NOTE — Discharge Instructions (Signed)
Breast abscess -finish all of your antibiotics (Clindamycin) -apply a dry guaze as needed to your left breast -you may soak in a bathtub for 15 mins twice daily or continue to apply warm compresses -if you develop fever, worsening pain, redness or worsening swelling please contact the office of [facility] [place] [facility] at any time.  You can take Tylenol for pain. We have also prescribed Oxycodone if you are having more severe pain.    Please call [facility] to confirm your appointment time next week.

## 2015-09-28 NOTE — Progress Notes (Signed)
Patient ID: Jenna Wong, female   DOB: 14-Mar-1951, 65 y.o.   MRN: [id number]     [facility]      [address] [place], [place] [address]    Phone: [phone] FAX: [phone]     Subjective: Wbc down.  Afebrile. Less sore today.   Objective:  Vital signs:  Filed Vitals:   09/27/15 1100 09/27/15 1305 09/27/15 2139 09/28/15 0627  BP:  126/76 108/57 115/67  Pulse:  71 69 64  Temp:  98.4 F (36.9 C) 98.2 F (36.8 C) 98.3 F (36.8 C)  TempSrc:  Oral Oral Oral  Resp:  '16 18 18  ' Height: '5\' 7"'  (1.702 m)     Weight: 91.173 kg (201 lb)     SpO2:  97% 94% 96%    Last BM Date: 09/26/15  Intake/Output   Yesterday:    This shift:      Physical Exam: General: Pt awake/alert/oriented x4 in no acute distress  Left breast-no erythema or areas or induration, purulent drainage from aspiration site. Minimally tender.   Problem List:   Principal Problem:   Acute mastitis of left breast Active Problems:   Hyperlipidemia   Personal history of tobacco use   Mastitis, left, acute   Left breast abscess    Results:   Labs: Results for orders placed or performed during the hospital encounter of 09/26/15 (from the past 48 hour(s))  CBC     Status: Abnormal   Collection Time: 09/26/15  4:01 PM  Result Value Ref Range   WBC 11.5 (H) 4.0 - 10.5 K/uL   RBC 4.14 3.87 - 5.11 MIL/uL   Hemoglobin 13.1 12.0 - 15.0 g/dL   HCT 38.8 36.0 - 46.0 %   MCV 93.7 78.0 - 100.0 fL   MCH 31.6 26.0 - 34.0 pg   MCHC 33.8 30.0 - 36.0 g/dL   RDW 13.6 11.5 - 15.5 %   Platelets 247 150 - 400 K/uL  Basic metabolic panel     Status: Abnormal   Collection Time: 09/26/15  4:01 PM  Result Value Ref Range   Sodium 139 135 - 145 mmol/L   Potassium 3.6 3.5 - 5.1 mmol/L   Chloride 106 101 - 111 mmol/L   CO2 24 22 - 32 mmol/L   Glucose, Bld 141 (H) 65 - 99 mg/dL   BUN 11 6 - 20 mg/dL   Creatinine, Ser 0.84 0.44 - 1.00 mg/dL   Calcium 9.2 8.9 - 10.3  mg/dL   GFR calc non Af Amer >60 >60 mL/min   GFR calc Af Amer >60 >60 mL/min    Comment: (NOTE) The eGFR has been calculated using the CKD EPI equation. This calculation has not been validated in all clinical situations. eGFR's persistently <60 mL/min signify possible Chronic Kidney Disease.    Anion gap 9 5 - 15  Culture, blood (routine x 2)     Status: None (Preliminary result)   Collection Time: 09/26/15  4:05 PM  Result Value Ref Range   Specimen Description BLOOD RIGHT ANTECUBITAL    Special Requests BOTTLES DRAWN AEROBIC AND ANAEROBIC 10CC    Culture NO GROWTH < 24 HOURS    Report Status PENDING   Culture, blood (routine x 2)     Status: None (Preliminary result)   Collection Time: 09/26/15  4:07 PM  Result Value Ref Range   Specimen Description BLOOD LEFT ANTECUBITAL    Special Requests BOTTLES DRAWN AEROBIC AND  ANAEROBIC 10CC    Culture NO GROWTH < 24 HOURS    Report Status PENDING   Basic metabolic panel     Status: Abnormal   Collection Time: 09/27/15  4:34 AM  Result Value Ref Range   Sodium 138 135 - 145 mmol/L   Potassium 3.8 3.5 - 5.1 mmol/L   Chloride 104 101 - 111 mmol/L   CO2 24 22 - 32 mmol/L   Glucose, Bld 183 (H) 65 - 99 mg/dL   BUN 10 6 - 20 mg/dL   Creatinine, Ser 0.84 0.44 - 1.00 mg/dL   Calcium 8.5 (L) 8.9 - 10.3 mg/dL   GFR calc non Af Amer >60 >60 mL/min   GFR calc Af Amer >60 >60 mL/min    Comment: (NOTE) The eGFR has been calculated using the CKD EPI equation. This calculation has not been validated in all clinical situations. eGFR's persistently <60 mL/min signify possible Chronic Kidney Disease.    Anion gap 10 5 - 15  Culture, routine-abscess     Status: None (Preliminary result)   Collection Time: 09/27/15  1:01 PM  Result Value Ref Range   Specimen Description ABSCESS BREAST LEFT    Special Requests SUB AREOLAR PHLEGMON    Gram Stain      ABUNDANT WBC PRESENT, PREDOMINANTLY PMN NO SQUAMOUS EPITHELIAL CELLS SEEN MODERATE GRAM  POSITIVE COCCI IN PAIRS Performed at [facility]    Culture PENDING    Report Status PENDING   CBC     Status: Abnormal   Collection Time: 09/28/15  5:40 AM  Result Value Ref Range   WBC 10.7 (H) 4.0 - 10.5 K/uL   RBC 3.96 3.87 - 5.11 MIL/uL   Hemoglobin 12.8 12.0 - 15.0 g/dL   HCT 37.1 36.0 - 46.0 %   MCV 93.7 78.0 - 100.0 fL   MCH 32.3 26.0 - 34.0 pg   MCHC 34.5 30.0 - 36.0 g/dL   RDW 13.6 11.5 - 15.5 %   Platelets 231 150 - 400 K/uL    Imaging / Studies: Ir US Guide Bx Asp/drain  09/27/2015  INDICATION: 65 year old female with a history of sub areola abscess. She has been referred for aspiration EXAM: IR ULTRASOUND GUIDANCE MEDICATIONS: The patient is currently admitted to the hospital and receiving intravenous antibiotics. The antibiotics were administered within an appropriate time frame prior to the initiation of the procedure. ANESTHESIA/SEDATION: None COMPLICATIONS: None PROCEDURE: Informed written consent was obtained from the patient after a thorough discussion of the procedural risks, benefits and alternatives. All questions were addressed. Maximal Sterile Barrier Technique was utilized including caps, mask, sterile gowns, sterile gloves, sterile drape, hand hygiene and skin antiseptic. A timeout was performed prior to the initiation of the procedure. Ultrasound survey was performed of the left breast with images stored and sent to PACs. The patient was then prepped and draped in the usual sterile fashion. The skin and subcutaneous tissues overlying the inflamed skin an fluid collection were generously infiltrated with 1% lidocaine for local anesthesia. A small stab incision was made with an 11 blade scalpel. Using ultrasound guidance, a large border Yueh needle was advanced into the collection of the sub areolar region. Approximately 1-2 cc of purulent fluid aspirated. No significant blood loss encountered. No complications encountered. Sterile bandage was placed. FINDINGS:  Ultrasound survey of the left breast, sub areolar region demonstrates a heterogeneously hyperechoic collection, with no significant fluid characteristics. This appears to be mostly phlegmon. Images during the case demonstrate the needle  tip within the collection. Approximately 1-2 cc of fluid aspirated. IMPRESSION: Status post ultrasound-guided aspiration of left sub areolar phlegmon. Given the complex nature and high viscosity of the phlegmon, complete decompression with aspiration is unlikely at this time. Sample was sent to the lab for analysis. Signed, Dulcy Fanny. Earleen Newport, DO [facility] Electronically Signed   By: Corrie Mckusick D.O.   On: 09/27/2015 13:16    Medications / Allergies:  Scheduled Meds: . acetaminophen  650 mg Oral Q6H  . aspirin EC  81 mg Oral Daily  . atorvastatin  40 mg Oral Daily  . enoxaparin (LOVENOX) injection  40 mg Subcutaneous Q24H  . sertraline  100 mg Oral q morning - 10a  . sertraline  50 mg Oral QHS  . vancomycin  1,500 mg Intravenous Q12H   Continuous Infusions:  PRN Meds:.morphine injection, ondansetron **OR** ondansetron (ZOFRAN) IV, oxyCODONE  Antibiotics: Anti-infectives    Start     Dose/Rate Route Frequency Ordered Stop   09/27/15 0700  vancomycin (VANCOCIN) 1,500 mg in sodium chloride 0.9 % 500 mL IVPB     1,500 mg 250 mL/hr over 120 Minutes Intravenous Every 12 hours 09/26/15 1733     09/26/15 1745  vancomycin (VANCOCIN) 2,000 mg in sodium chloride 0.9 % 500 mL IVPB     2,000 mg 250 mL/hr over 120 Minutes Intravenous  Once 09/26/15 1732 09/26/15 2234   09/26/15 1600  doxycycline (VIBRAMYCIN) 100 mg in dextrose 5 % 250 mL IVPB  Status:  Discontinued     100 mg 125 mL/hr over 120 Minutes Intravenous Every 12 hours 09/26/15 1453 09/26/15 1732        Assessment/Plan S/p left breast aspiration-stable for discharge from a surgical standpoint on po antibiotics such as doxy or septra.  I will  schedule her a follow up next week for a wound check in our office.  Instructions provided.  Signs that warrant further evaluation were discussed.  Please call with further assistance.    Erby Pian, [facility] Surgery Pager [phone]) For consults and floor pages call [phone]([id number])  09/28/2015 9:33 AM

## 2015-09-30 LAB — CULTURE, ROUTINE-ABSCESS: Culture: NORMAL

## 2015-10-01 LAB — CULTURE, BLOOD (ROUTINE X 2)
Culture: NO GROWTH
Culture: NO GROWTH

## 2015-10-04 ENCOUNTER — Encounter: Payer: Self-pay | Admitting: Internal Medicine

## 2015-10-04 ENCOUNTER — Ambulatory Visit (INDEPENDENT_AMBULATORY_CARE_PROVIDER_SITE_OTHER): Payer: Medicare Other | Admitting: Internal Medicine

## 2015-10-04 VITALS — BP 125/78 | HR 105 | Temp 98.0°F | Wt 215.5 lb

## 2015-10-04 DIAGNOSIS — N611 Abscess of the breast and nipple: Secondary | ICD-10-CM | POA: Diagnosis not present

## 2015-10-04 DIAGNOSIS — R7303 Prediabetes: Secondary | ICD-10-CM

## 2015-10-04 DIAGNOSIS — Z87891 Personal history of nicotine dependence: Secondary | ICD-10-CM | POA: Diagnosis not present

## 2015-10-04 LAB — POCT GLYCOSYLATED HEMOGLOBIN (HGB A1C): HEMOGLOBIN A1C: 7.6

## 2015-10-04 NOTE — Assessment & Plan Note (Signed)
S/p drainage. Stable since hospital discharge. No concerns for reaccumulation of abscess or signs of infection at this time.  - Continue clindamycin (last dose tomorrow) - F/u with surgeon (appt tomorrow)

## 2015-10-04 NOTE — Assessment & Plan Note (Signed)
Elevated A1C 3 months ago.  - Check A1C today - F/u with PCP

## 2015-10-04 NOTE — Patient Instructions (Signed)
It was nice meeting you today Ms. Jenna Wong.   Please continue to take your antibiotics (clindamycin). Your last dose should be tomorrow.   It is important to go to your follow-up appointment with the surgeon tomorrow.   If you have any questions or concerns, feel free to call our office.   Be well,  Dr. Avon Gully

## 2015-10-04 NOTE — Progress Notes (Signed)
   Subjective:    Patient ID: Jenna Wong, female    DOB: 1950-12-14, 65 y.o.   MRN: [id number]  HPI  Patient presents for hospital follow-up.   Breast abscess Patient recently hospitalized for a L breast abscess. She was discharged 6 days ago. She has been taking clindamycin since then, and will complete a 10 day course tomorrow. She has not missed any days of medication. She denies fevers or chills. She denies pain at the site of I&D, but does endorse some nipple pain and swelling. She has a follow-up appointment with her surgeon tomorrow. She endorses only minimal discharge from incision site. Denies discharge or bleeding from nipple.   Smoking cessation Patient currently smoking 1 ppd. She has smoked less in the past, but increased the amount she is smoking daily after both of her sisters died last year. She said she primarily smokes when she is depressed or anxious. She is taking Zoloft and says that works well, but smokes more when there are difficult situations in her life. She is very interested in quitting smoking, but thinks on a scale of 1 to 10, she is only at a 5 regarding confidence in the ability to quit. She would like to quit so that she can do the activities she has done in the past, such as walking and going to the park. She also has a friend who recently died of lung cancer, and knows that she is at increased risk of developing lung cancer since she is a smoker. She has tried nicorette gum in the past without success.   Review of Systems See HPI.     Objective:   Physical Exam  Constitutional: She is oriented to person, place, and time. She appears well-developed and well-nourished.  HENT:  Head: Normocephalic and atraumatic.  Pulmonary/Chest:    Neurological: She is alert and oriented to person, place, and time.  Skin: Skin is warm and dry.  Psychiatric: She has a normal mood and affect. Her behavior is normal.      Assessment & Plan:  Left breast abscess S/p  drainage. Stable since hospital discharge. No concerns for reaccumulation of abscess or signs of infection at this time.  - Continue clindamycin (last dose tomorrow) - F/u with surgeon (appt tomorrow)   Personal history of tobacco use Patient motivated to stop smoking. Is interested in resources to help her quit.  - Schedule appt with Dr. Valentina Lucks in [place] clinic to discuss smoking cessation  Pre-diabetes Elevated A1C 3 months ago.  - Check A1C today - F/u with PCP    Adin Hector, MD PGY-1 [facility] Pager [phone]

## 2015-10-04 NOTE — Assessment & Plan Note (Signed)
Patient motivated to stop smoking. Is interested in resources to help her quit.  - Schedule appt with Dr. Valentina Lucks in [place] clinic to discuss smoking cessation

## 2015-10-06 ENCOUNTER — Ambulatory Visit: Payer: Medicare Other | Admitting: Pharmacist

## 2015-10-20 ENCOUNTER — Ambulatory Visit (INDEPENDENT_AMBULATORY_CARE_PROVIDER_SITE_OTHER): Payer: Medicare Other | Admitting: Family Medicine

## 2015-10-20 ENCOUNTER — Encounter: Payer: Self-pay | Admitting: Family Medicine

## 2015-10-20 VITALS — BP 138/83 | HR 92 | Temp 98.2°F | Wt 212.0 lb

## 2015-10-20 DIAGNOSIS — N611 Abscess of the breast and nipple: Secondary | ICD-10-CM

## 2015-10-20 DIAGNOSIS — R197 Diarrhea, unspecified: Secondary | ICD-10-CM | POA: Diagnosis not present

## 2015-10-20 NOTE — Progress Notes (Signed)
Subjective:     Patient ID: Jenna Wong, female   DOB: 1951/05/29, 65 y.o.   MRN: [id number]  HPI  Jenna Wong is a 65 y.o. Female who presents with 5 day history of diarrhea and for follow-up of left breast aspiration of abscess.   Acute concerns: Diarrhea: Started last 5 days, took peptobismol immediately that evening and finished the bottle. Says it did not help with the diarrhea. Diarrhea does not appear to be improving. Says stool is runny, yellow in color, has 5 bowel movements a day. Has noticed bright red blood but attributes it to frequent wiping that now causes pain. Goes to the bathroom every time she eats. Has cramping across bottom of stomach that is worse during and immediately after bowel movement. Feels better after a few minutes after. Cramps vary in pain whenever they come on. Gets chills, not sure if she has had fever. Endorses nausea but no vomiting. Has never had diarrhea this long. Happens after eating anything. No sick contacts. Had a partial hysterectomy for fibroids 10-15 years ago. No recent travel.   Left breast abscess: S/p aspiration of abscess on 4/4. Was prescribed Clindamycin treatment which she finished. Says she did not get surgery. Abscess has since gotten smaller but is concerned of hard nodule inside of nipple. Some tenderness still present. Nipple is sore and swollen. Uses hot wet wash cloth to help with soreness and swelling of breast. Denies fever or redness in breast and says incision site has closed up.    Additional concerns: She had an MRI done at the hospital and blood work collected here in clinic a few weeks ago. Wants to know what they were for.   Currently taking all of her medications.   Review of Systems Normal other than stated in HPI    Objective:   Physical Exam Filed Vitals:   10/20/15 1349  BP: 138/83  Pulse: 92  Temp: 98.2 F (36.8 C)  Blood pressure 138/83, pulse 92, temperature 98.2 F (36.8 C), temperature source Oral, weight  212 lb (96.163 kg).  Gen: Healthy appearing, conversant and oriented to time and place.  HEENT: MMM Pulm: Clear to auscultation bilaterally. No increased work of breathing CV: RRR. No murmurs, rubs, or gallops Abdominal: Tender to palpation in lower quadrants bilaterally. Pain is sharp and worse in RLQ. Soft. Normoactive bowel signs. No palpable masses or hepatosplenomegaly.  Skin: Incision site on left breast shows normal healing with no signs of inflammation. Hard nodule located within nipple. No nipple discharge or surrounding rash.     Assessment:     Jenna Wong is a 65 y.o. Female who presents with 5 day history of diarrhea following treatment of aspirated left breast abscess concerning for C.Diff given treatment with Clindamycin, presentation of GI symptoms following antibiotic treatment, frequent bowel movements, and abdominal pain. Viral GE is also likely given absence of fever, nausea, and absence of bloody-diarrhea. Concern for possible IBS given history of IBS, but unlikely given unusual severity of diarrheal symptoms.  Left breast abscess appears to be healing well. No signs of infection or inflammation at site of aspiration. Hard nipple nodule concerning for scar tissue following abscess drainage.      Plan:     Diarrhea: Recommend collecting stool sample and assessing for C. Diff. PO Vancomycin recommended if sample is positive. Advised patient importance of eating and drinking well.   Left breast abscess: Recommend continued observation of wound site. Discussed with patient that hard nodule might take  time to diminish in size.

## 2015-10-20 NOTE — Patient Instructions (Addendum)
Checking your stool for infection - please bring [place] a sample from home once you're able to do that. If you have worsening bleeding please call us or your GI doctor  The breast looks okay. May take more time to heal completely.  Follow up with Dr. Alease Frame in 3-4 weeks for your chronic medical issues.  Be well, Dr. Ardelia Mems

## 2015-10-23 NOTE — Assessment & Plan Note (Signed)
Healing well. Counseled "knot" she feels is likely postinflammatory scar tissue. No fluctuance or tenderness on exam today to raise concern for recurrence of abscess. Offered reassurance. Follow up with surgery as needed

## 2015-10-25 ENCOUNTER — Telehealth: Payer: Self-pay | Admitting: *Deleted

## 2015-10-25 NOTE — Telephone Encounter (Signed)
LMOVM for pt to call us back. She needs to come back to the office and receive the right stool specimen container for her lab tests. She wasn't given the right one before. Deseree Kennon Holter, CMA

## 2015-12-21 ENCOUNTER — Ambulatory Visit (INDEPENDENT_AMBULATORY_CARE_PROVIDER_SITE_OTHER): Payer: Medicare Other | Admitting: Family Medicine

## 2015-12-21 ENCOUNTER — Encounter: Payer: Self-pay | Admitting: Family Medicine

## 2015-12-21 VITALS — BP 145/81 | HR 87 | Temp 98.2°F | Wt 219.0 lb

## 2015-12-21 DIAGNOSIS — M4806 Spinal stenosis, lumbar region: Secondary | ICD-10-CM | POA: Diagnosis not present

## 2015-12-21 DIAGNOSIS — E785 Hyperlipidemia, unspecified: Secondary | ICD-10-CM

## 2015-12-21 DIAGNOSIS — F418 Other specified anxiety disorders: Secondary | ICD-10-CM

## 2015-12-21 DIAGNOSIS — M48061 Spinal stenosis, lumbar region without neurogenic claudication: Secondary | ICD-10-CM

## 2015-12-21 DIAGNOSIS — I10 Essential (primary) hypertension: Secondary | ICD-10-CM

## 2015-12-21 MED ORDER — DULOXETINE HCL 30 MG PO CPEP: 30.0000 mg | ORAL_CAPSULE | Freq: Every day | ORAL | Status: DC

## 2015-12-21 MED ORDER — LISINOPRIL 10 MG PO TABS: 10.0000 mg | ORAL_TABLET | Freq: Every day | ORAL | Status: DC

## 2015-12-21 MED ORDER — ATORVASTATIN CALCIUM 40 MG PO TABS: 40.0000 mg | ORAL_TABLET | Freq: Every day | ORAL | Status: DC

## 2015-12-21 MED ORDER — SERTRALINE HCL 50 MG PO TABS: 100.0000 mg | ORAL_TABLET | Freq: Every day | ORAL | Status: DC

## 2015-12-21 MED ORDER — OXYCODONE HCL 5 MG PO TABS: 5.0000 mg | ORAL_TABLET | ORAL | Status: DC | PRN

## 2015-12-21 NOTE — Assessment & Plan Note (Signed)
Endorses good compliance with medications at this time.  - Refilling atorvastatin

## 2015-12-21 NOTE — Assessment & Plan Note (Signed)
Patient endorses worsening depression over the past few months. She had been on Zoloft 150 mg daily. - Titrating Zoloft down. 100 mg 1 week and then off - Titrating Cymbalta up. 30 mg daily at this time. We'll likely increase this dosage over the next few weeks. - I have set up an appointment with our continuity [place] team. She will be seen on Friday 12/23/15.

## 2015-12-21 NOTE — Patient Instructions (Signed)
It was a pleasure seeing you today in our clinic. Today we discussed your pain. Here is the treatment plan we have discussed and agreed upon together:   - For the next week I would like you to only take your morning dose of Zoloft. After that week you can discontinue the use of your Zoloft. - I would like for you to start taking Cymbalta. Take 1 tablet every day. - Begin taking lisinopril. Take 1 tablet every day. - I've refilled some of her pain medication at this time. - If you would like we can set up an appointment with some of our behavioral health specialists to discuss some of your depression that has worsened over the past couple months.

## 2015-12-21 NOTE — Assessment & Plan Note (Addendum)
Patient has a history of spinal stenosis diagnosed in 2009. Symptoms consistent with chronic pain with nonspecific findings. Straight leg raise was negative but range of motion significantly diminished secondary to discomfort/pain. Patient was very upset when I informed her that I did not want her to be taking narcotic medication long-term. No red flags symptoms at this time. - Initiating Cymbalta for chronic pain - Oxycodone 30 tablets while Cymbalta is titrating up. - Patient not interested in physical therapy. We'll attempt to bring this topic up again after pain is under better control.

## 2015-12-21 NOTE — Progress Notes (Signed)
HPI  CC: Back pain and leg pain Patient is here with complaints of back and leg pain. She states that this is been progressive and constant over the past 6 months to year. She denies any significant weakness or numbness. She states that pain feels electric in nature but does not endorse radiating pain from her pelvis/low back. She endorses stiffness with movement. She denies any red flag symptoms like weakness, progressive numbness, or fecal/urinary incontinence. She states that pain is unchanged from previous in which she was diagnosed with spinal stenosis.  Patient is also endorsing increased depression. She states that she has had a difficult time over the past few months with some family issues. She denies any SI/HI. She believes that her Zoloft is providing some benefit but is not fully controlling some of this depression. She is been trying to get an appointment with a psychiatrist but has had difficulty due to scheduling problems. Patient is interested in further discussion with some of our continuity [place] resources.  Review of Systems   See HPI for ROS. All other systems reviewed and are negative.   Objective: BP 145/81 mmHg  Pulse 87  Temp(Src) 98.2 F (36.8 C) (Oral)  Wt 219 lb (99.338 kg)  SpO2 100% Gen: NAD, alert, flat affect CV: RRR, no murmur Resp: CTAB, no wheezes, non-labored Ext: No edema, warm, reduced range of motion in the lower extremities secondary to discomfort, lumbar spine with reduced range of motion secondary to discomfort, negative straight leg raise, DTRs +2 bilaterally, strength intact throughout. Sensation intact throughout. Good muscle tone throughout. Neuro: Alert and oriented, Speech clear, No gross deficits. Flat affect.  Assessment and plan:  SPINAL STENOSIS, LUMBAR Patient has a history of spinal stenosis diagnosed in 2009. Symptoms consistent with chronic pain with nonspecific findings. Straight leg raise was negative but range of  motion significantly diminished secondary to discomfort/pain. Patient was very upset when I informed her that I did not want her to be taking narcotic medication long-term. No red flags symptoms at this time. - Initiating Cymbalta for chronic pain - Oxycodone 30 tablets while Cymbalta is titrating up. - Patient not interested in physical therapy. We'll attempt to bring this topic up again after pain is under better control.  Depression with anxiety Patient endorses worsening depression over the past few months. She had been on Zoloft 150 mg daily. - Titrating Zoloft down. 100 mg 1 week and then off - Titrating Cymbalta up. 30 mg daily at this time. We'll likely increase this dosage over the next few weeks. - I have set up an appointment with our continuity [place] team. She will be seen on Friday 12/23/15.  Hyperlipidemia Endorses good compliance with medications at this time.  - Refilling atorvastatin  Essential hypertension Blood pressure was 145/80 in the clinic. This may be due to pain but previous measurements have ranged in the borderline high range in the past. - Initiating lisinopril 10 mg daily. - We'll repeat BMP at next visit.   Meds ordered this encounter  Medications  . atorvastatin (LIPITOR) 40 MG tablet    Sig: Take 1 tablet (40 mg total) by mouth daily.    Dispense:  90 tablet    Refill:  3  . sertraline (ZOLOFT) 50 MG tablet    Sig: Take 2 tablets (100 mg total) by mouth daily.    Dispense:  14 tablet    Refill:  0  . DULoxetine (CYMBALTA) 30 MG capsule    Sig:  Take 1 capsule (30 mg total) by mouth daily.    Dispense:  30 capsule    Refill:  1  . lisinopril (PRINIVIL,ZESTRIL) 10 MG tablet    Sig: Take 1 tablet (10 mg total) by mouth daily.    Dispense:  90 tablet    Refill:  3  . oxyCODONE (OXY IR/ROXICODONE) 5 MG immediate release tablet    Sig: Take 1 tablet (5 mg total) by mouth every 4 (four) hours as needed for severe pain.    Dispense:  30  tablet    Refill:  0     Elberta Leatherwood, MD,MS,  PGY2 12/21/2015 6:55 PM

## 2015-12-21 NOTE — Assessment & Plan Note (Addendum)
Blood pressure was 145/80 in the clinic. This may be due to pain but previous measurements have ranged in the borderline high range in the past. - Initiating lisinopril 10 mg daily. - We'll repeat BMP at next visit.

## 2015-12-23 ENCOUNTER — Ambulatory Visit (INDEPENDENT_AMBULATORY_CARE_PROVIDER_SITE_OTHER): Payer: Medicare Other | Admitting: Psychology

## 2015-12-23 DIAGNOSIS — F418 Other specified anxiety disorders: Secondary | ICD-10-CM

## 2015-12-23 NOTE — Progress Notes (Signed)
Dr. Alease Frame requested a [place].   Presenting Issue: Patient presents with symptoms of depression.  Report of symptoms: Patient reported depressed mood, anhedonia (lack of motivation), insomnia, and fatigue.  Duration of CURRENT symptoms: Patient reported that her current symptoms have been present for approximately 10 years.  Age of onset of first mood disturbance: 17 (per patient)  Impact on function: Patient reports that she is unable to participate in activities that are very important to her, such as attending church, going to the gym, and spending time with family.  Psychiatric History - Diagnoses: Depression, with anxiety - Hospitalizations:None reported - Pharmacotherapy: Sertraline - Outpatient therapy: None  Family history of psychiatric issues: Not assessed  Current and history of substance use: Occasional alcohol use; denied other substance use.  PHQ-9: 13  Assessment / Plan / Recommendations:Patient reported that she has had difficulty with depression since her sibling died 33 years ago. Two more siblings died soon afterwards, and she also reports that she was instructed by her physician to leave her job due to chronic back problems around the same time. All of these events happened within 3 years of each other. Since that time, she has struggled with depressed mood, decreased motivation to engage in usual activities, insomnia, and fatigue. She reports that she used to enjoy going to church and going to the gym, but that she stopped going to both places and "does not know why". She also reported that spending time with her family is very important to her, but that she struggles to find motivation to spend time with them, and does not enjoy herself when she spends time with others. As a result, she spends most of her time at home, and occasionally talks to other people who live on her street. [facility] incorporated acceptance and commitment therapy to encourage  patient to notice (and not ignore or try to repress) her negative feelings, but to live out her values in spite of them. [facility] also discussed importance of continuing to participate in other activities that she enjoys. This week, patient agreed to go to the gym. Patient will follow-up with [facility] in two weeks.

## 2016-01-02 ENCOUNTER — Ambulatory Visit (INDEPENDENT_AMBULATORY_CARE_PROVIDER_SITE_OTHER): Payer: Medicare Other | Admitting: Psychology

## 2016-01-02 DIAGNOSIS — F418 Other specified anxiety disorders: Secondary | ICD-10-CM

## 2016-01-02 NOTE — Progress Notes (Signed)
This patient was seen by Willene Hatchet, [facility].  Documentation is under my name secondary to him not yet having EPIC access.  Supervision was provided.     Reason for follow-up:  Patient returns for continued treatment for depression and anxiety. Last seen by Georgina Peer [facility]) and now initiating treatment with Willene Hatchet [facility]).  Issues discussed:  Depression (continued anhedonia, didn't enjoy time with family members in past 2 weeks). Patient was not able to go the gym as previously planned.   Duloxetine: Patient started new medication approximately 2 weeks ago, not tolerating well, secondary to coughing, dry throat, headaches, stomach cramps. She is interested in another medication but thinks that she will continue Duloxetine for now hoping the side effects will attenuate.    Family stressors: Patient revisited grief of her brother and sister dying many years ago as well as ongoing cancer treatment for her other brother.  Identified goals:  Patient would like to start learning how to swim at the [facility] and continue to attend family events.

## 2016-01-02 NOTE — Assessment & Plan Note (Signed)
Affect somewhat muted upon presentation, but seemed to shift over the appointment and in the end, made occasional jokes and smiled more frequently. Core values are living an active life, spending time with family/friends, but current behavior is not in line with values.  Patient stated over next 2 weeks[facility] Will continue to attend family events even if doesn't feel like it.  Follow up: Will return July 24th Monday at 9:30 AM.

## 2016-01-06 ENCOUNTER — Encounter: Payer: Self-pay | Admitting: Psychology

## 2016-01-06 ENCOUNTER — Telehealth: Payer: Self-pay | Admitting: Psychology

## 2016-01-06 NOTE — Telephone Encounter (Signed)
Jenna Wong spoke with patient encouraging her to call her PCP to discuss her current medication Duloxetine. Patient will follow-up with physician.

## 2016-01-16 ENCOUNTER — Ambulatory Visit (INDEPENDENT_AMBULATORY_CARE_PROVIDER_SITE_OTHER): Payer: Medicare Other | Admitting: Psychology

## 2016-01-16 DIAGNOSIS — F418 Other specified anxiety disorders: Secondary | ICD-10-CM

## 2016-01-16 NOTE — Progress Notes (Signed)
Patient ID: Jenna Wong, female   DOB: 21-Aug-1950, 65 y.o.   MRN: [id number]  Reason for follow-up:  To discuss symptoms of Depression and assess progress since last appointment.  Issues discussed:  Ineffectiveness of duloxetine, progress with behavioral activation, fears (money, health concerns), and sleep difficulties.  Identified goals:  Continue walking at [facility] track, go to [facility] Sunday, swim in nephew's pool, make med appointment with Dr. Alease Frame.

## 2016-01-16 NOTE — Assessment & Plan Note (Addendum)
Affect appropriate, occasional smiling and jokes. Expressed hope and pride for completing behavioral activation.  Assessment: Stopped taking duloxetine 1 week ago due to side effects (headaches and eye problems) though wants to revisit medication. Walked around track in [place] last week and felt proud and more positive. Discussed panic symptoms and worries about money, heart attacks and strokes.  Plan: Will continue walking at [facility], return to [place], and go to [employer]. Will schedule appointment with Dr. Alease Frame to discuss medication today. Will return for follow-up with [facility] in 2 weeks to discuss anxiety, sleep problems, and progress with behavioral activation.

## 2016-01-16 NOTE — Patient Instructions (Signed)
Will return 8/7 9:30AM Plan -continue going to the track -going to church Sunday -going to the pool with lifejacket -Schedule appointment with Dr. Alease Frame

## 2016-01-19 ENCOUNTER — Encounter: Payer: Self-pay | Admitting: Family Medicine

## 2016-01-19 ENCOUNTER — Ambulatory Visit (INDEPENDENT_AMBULATORY_CARE_PROVIDER_SITE_OTHER): Payer: Medicare Other | Admitting: Family Medicine

## 2016-01-19 DIAGNOSIS — R109 Unspecified abdominal pain: Secondary | ICD-10-CM | POA: Insufficient documentation

## 2016-01-19 DIAGNOSIS — F418 Other specified anxiety disorders: Secondary | ICD-10-CM

## 2016-01-19 DIAGNOSIS — R1010 Upper abdominal pain, unspecified: Secondary | ICD-10-CM

## 2016-01-19 MED ORDER — POLYETHYLENE GLYCOL 3350 17 GM/SCOOP PO POWD: 17.0000 g | Freq: Every day | ORAL | 1 refills | Status: DC

## 2016-01-19 NOTE — Assessment & Plan Note (Signed)
Patient is complaining of cramp-like upper abdominal pain that is improved with bowel movements. History and physical exam makes etiology likely constipated pain. We'll treat with MiraLAX

## 2016-01-19 NOTE — Progress Notes (Signed)
ABDOMINAL PAIN Patient is complaining of abdominal pain which began 2-3 weeks ago. She states that pain is cramping in nature and seems to radiate from her right upper quadrant to her left upper quadrant. She has some feelings of bloating and says that bowel movements seem to make this pain better. She reports only having 1 bowel movement every other day most of the time.  Pain began 2-3 weeks ago Medications tried: no Similar pain before: no Prior abdominal surgeries: fibroid removal   Symptoms Nausea/vomiting: only nausea Diarrhea: no Constipation: occasionally  Blood in stool: no Blood in vomit: n/a Fever: no Dysuria: no Loss of appetite: no Weight loss: no  Vaginal Bleeding: no; s/p hysterectomy Missed menstrual period: n/a   Depression/anxiety: Patient thought that abdominal pain was being caused by her new prescription of Cymbalta. Because of that she has not taken any for Cymbalta nor has she taken any of her Zoloft which had been discontinued with the initiation of Cymbalta. She denies any significant symptoms are not artery list above. No tremors, diaphoresis, headache, vision changes, lightheadedness, dizziness, chest pain, shortness of breath, weakness, numbness, paresthesias.   Review of Symptoms - see HPI PMH - Smoking status noted.    CC, SH/smoking status, and VS noted  Objective: BP 125/80 (BP Location: Left Arm, Patient Position: Sitting, Cuff Size: Large)   Pulse (!) 102   Temp 98.4 F (36.9 C) (Oral)   Ht 5\' 7"  (1.702 m)   Wt 219 lb (99.3 kg)   BMI 34.30 kg/m  Gen: NAD, alert, cooperative, and pleasant. HEENT: NCAT, EOMI, PERRL CV: RRR, no murmur Resp: CTAB, no wheezes, non-labored Abd: S, mild tenderness noted at LLQ, ND, BS present, no guarding or organomegaly Ext: No edema, warm, reduced range of motion in the lower extremities secondary to discomfort, lumbar spine with reduced range of motion secondary to discomfort, negative straight leg raise,  DTRs +2 bilaterally, strength intact throughout. Sensation intact throughout. Good muscle tone throughout. Neuro: Alert and oriented, Speech clear, No gross deficits. Flat affect.  Assessment and plan:  Abdominal pain Patient is complaining of cramp-like upper abdominal pain that is improved with bowel movements. History and physical exam makes etiology likely constipated pain. We'll treat with MiraLAX  Depression with anxiety Patient was recently transitioned from Zoloft to Cymbalta. She stopped taking Cymbalta due to concerns about her abdominal pain. Patient has not taken Cymbalta in nearly 2 weeks yet abdominal pain persists. Because of this abdominal pain is likely not to to this medication. I've asked that she begin taking this medication once again in follow-up in 4 weeks for reevaluation.   Meds ordered this encounter  Medications  . polyethylene glycol powder (GLYCOLAX/MIRALAX) powder    Sig: Take 17 g by mouth daily.    Dispense:  3350 g    Refill:  1     Elberta Leatherwood, MD,MS,  PGY3 01/19/2016 7:01 PM

## 2016-01-19 NOTE — Patient Instructions (Signed)
It was a pleasure seeing you today in our clinic. Today we discussed your abdominal pain and depression. Here is the treatment plan we have discussed and agreed upon together:   - I would like for you to begin taking the Cymbalta that had been prescribed. At this time I do not believe that the abdominal pain you're experiencing is from this medicine. - I've prescribed do MiraLAX. Take this medication once every day. My hope is for you to have one stool that is relatively soft every day. - Come back and see [place] in 4 weeks to reassess how things are going with the Cymbalta.

## 2016-01-19 NOTE — Assessment & Plan Note (Signed)
Patient was recently transitioned from Zoloft to Cymbalta. She stopped taking Cymbalta due to concerns about her abdominal pain. Patient has not taken Cymbalta in nearly 2 weeks yet abdominal pain persists. Because of this abdominal pain is likely not to to this medication. I've asked that she begin taking this medication once again in follow-up in 4 weeks for reevaluation.

## 2016-01-20 ENCOUNTER — Telehealth: Payer: Self-pay | Admitting: *Deleted

## 2016-01-20 NOTE — Telephone Encounter (Signed)
Please give pharmacy a call to clarifiy quantity.  Derl Barrow, RN

## 2016-01-20 NOTE — Telephone Encounter (Signed)
Pt called wanting to know when she could pick up her medication at [facility], she said [facility] told her to call us because there was an issue about how much to give. ep

## 2016-01-20 NOTE — Telephone Encounter (Signed)
Called pharmacy. Rx waiting

## 2016-01-20 NOTE — Telephone Encounter (Signed)
[facility] pharmacy left voice message of nurse line needing to know how many bottles of Miralax to dispense.  Please give them a call at [phone].  Derl Barrow, RN

## 2016-01-30 ENCOUNTER — Ambulatory Visit: Payer: Medicare Other

## 2016-01-30 ENCOUNTER — Telehealth: Payer: Self-pay | Admitting: Psychology

## 2016-01-30 NOTE — Telephone Encounter (Signed)
[facility] spoke with patient's sister. Sister will have patient call us.

## 2016-02-03 ENCOUNTER — Telehealth: Payer: Self-pay | Admitting: Psychology

## 2016-02-03 NOTE — Telephone Encounter (Signed)
[facility] left message asking patient to reschedule follow-up appointment if interested.

## 2016-02-13 ENCOUNTER — Ambulatory Visit: Payer: Medicare Other

## 2016-02-14 ENCOUNTER — Ambulatory Visit (INDEPENDENT_AMBULATORY_CARE_PROVIDER_SITE_OTHER): Payer: Medicare Other | Admitting: Psychology

## 2016-02-14 DIAGNOSIS — F418 Other specified anxiety disorders: Secondary | ICD-10-CM

## 2016-02-14 NOTE — Assessment & Plan Note (Signed)
Assessment/Plan/Recommendations: Patient continues to experience some stomach cramping, but noted that the frequency of pain has decreased. She has been taking Cymbalta daily and using Miralax as instructed by her doctor. She has noticed that she gets tired around 7pm every night and suspects this may be due to Cymbalta but is unsure. She did note that her mood has been improved. Patient was able to engage in a few pleasant activities that she reported made her feel good. These included going to church once and going to the pool with her nephew. She was pleasantly surprised that most people at church were happy to see her and did not judge her for her absences. Patient would like to continue following up with [facility] as she feels it has helped her a lot. She would prefer to have appointments with Caroleen Hamman since he knows her history. She will return to see Vaibhav next week.

## 2016-02-14 NOTE — Patient Instructions (Addendum)
It was nice to meet you today - Your next appointment will be with Caroleen Hamman on August 28th at 11am.   Over the next week, you plan to call your nephew to continue learning how to swim. You also plan to go to [facility] to walk on the trails.

## 2016-02-14 NOTE — Progress Notes (Signed)
Reason for follow-up:  Depression  Issues discussed:  Patient discussed medication use and side effects as well as recent pleasant activities she has been engaging in.

## 2016-02-20 ENCOUNTER — Ambulatory Visit: Payer: Medicare Other

## 2016-02-20 ENCOUNTER — Telehealth: Payer: Self-pay | Admitting: Psychology

## 2016-02-20 NOTE — Telephone Encounter (Signed)
[facility] called following no-show. Patient's number not working.

## 2016-03-06 ENCOUNTER — Emergency Department (HOSPITAL_COMMUNITY): Payer: Medicare Other

## 2016-03-06 ENCOUNTER — Encounter (HOSPITAL_COMMUNITY): Payer: Self-pay | Admitting: Emergency Medicine

## 2016-03-06 ENCOUNTER — Emergency Department (HOSPITAL_COMMUNITY)
Admission: EM | Admit: 2016-03-06 | Discharge: 2016-03-06 | Disposition: A | Discharge status: Home / Self Care | Payer: Medicare Other | Attending: Emergency Medicine | Admitting: Emergency Medicine

## 2016-03-06 DIAGNOSIS — R112 Nausea with vomiting, unspecified: Secondary | ICD-10-CM | POA: Diagnosis present

## 2016-03-06 DIAGNOSIS — I10 Essential (primary) hypertension: Secondary | ICD-10-CM | POA: Diagnosis not present

## 2016-03-06 DIAGNOSIS — R1032 Left lower quadrant pain: Secondary | ICD-10-CM | POA: Insufficient documentation

## 2016-03-06 DIAGNOSIS — R42 Dizziness and giddiness: Secondary | ICD-10-CM

## 2016-03-06 DIAGNOSIS — Z7982 Long term (current) use of aspirin: Secondary | ICD-10-CM | POA: Insufficient documentation

## 2016-03-06 DIAGNOSIS — F17210 Nicotine dependence, cigarettes, uncomplicated: Secondary | ICD-10-CM | POA: Insufficient documentation

## 2016-03-06 DIAGNOSIS — Z79899 Other long term (current) drug therapy: Secondary | ICD-10-CM | POA: Insufficient documentation

## 2016-03-06 LAB — URINALYSIS, ROUTINE W REFLEX MICROSCOPIC
Bilirubin Urine: NEGATIVE
Glucose, UA: >1000 mg/dL — AB
KETONES UR: NEGATIVE mg/dL
LEUKOCYTES UA: NEGATIVE
NITRITE: NEGATIVE
PROTEIN: NEGATIVE mg/dL
Specific Gravity, Urine: 1.025 (ref 1.005–1.030)
pH: 5 (ref 5.0–8.0)

## 2016-03-06 LAB — CBC
HCT: 41.6 % (ref 36.0–46.0)
Hemoglobin: 14.4 g/dL (ref 12.0–15.0)
MCH: 31.6 pg (ref 26.0–34.0)
MCHC: 34.6 g/dL (ref 30.0–36.0)
MCV: 91.4 fL (ref 78.0–100.0)
PLATELETS: 240 10*3/uL (ref 150–400)
RBC: 4.55 MIL/uL (ref 3.87–5.11)
RDW: 13.4 % (ref 11.5–15.5)
WBC: 8.3 10*3/uL (ref 4.0–10.5)

## 2016-03-06 LAB — URINE MICROSCOPIC-ADD ON

## 2016-03-06 LAB — COMPREHENSIVE METABOLIC PANEL
ALT: 29 U/L (ref 14–54)
AST: 22 U/L (ref 15–41)
Albumin: 4.3 g/dL (ref 3.5–5.0)
Alkaline Phosphatase: 67 U/L (ref 38–126)
Anion gap: 6 (ref 5–15)
BILIRUBIN TOTAL: 0.8 mg/dL (ref 0.3–1.2)
BUN: 11 mg/dL (ref 6–20)
CHLORIDE: 103 mmol/L (ref 101–111)
CO2: 26 mmol/L (ref 22–32)
CREATININE: 0.75 mg/dL (ref 0.44–1.00)
Calcium: 9.2 mg/dL (ref 8.9–10.3)
Glucose, Bld: 275 mg/dL — ABNORMAL HIGH (ref 65–99)
POTASSIUM: 4.1 mmol/L (ref 3.5–5.1)
Sodium: 135 mmol/L (ref 135–145)
TOTAL PROTEIN: 7.7 g/dL (ref 6.5–8.1)

## 2016-03-06 LAB — I-STAT TROPONIN, ED: TROPONIN I, POC: 0 ng/mL (ref 0.00–0.08)

## 2016-03-06 LAB — CBG MONITORING, ED: GLUCOSE-CAPILLARY: 270 mg/dL — AB (ref 65–99)

## 2016-03-06 LAB — LIPASE, BLOOD: LIPASE: 22 U/L (ref 11–51)

## 2016-03-06 MED ORDER — ONDANSETRON HCL 4 MG/2ML IJ SOLN
4.0000 mg | Freq: Once | INTRAMUSCULAR | Status: AC
  Administered 2016-03-06: 4 mg via INTRAVENOUS
  Filled 2016-03-06: qty 2

## 2016-03-06 MED ORDER — DIAZEPAM 5 MG/ML IJ SOLN
5.0000 mg | Freq: Once | INTRAMUSCULAR | Status: AC
  Administered 2016-03-06: 5 mg via INTRAVENOUS
  Filled 2016-03-06: qty 2

## 2016-03-06 MED ORDER — SODIUM CHLORIDE 0.9 % IV BOLUS (SEPSIS)
1000.0000 mL | Freq: Once | INTRAVENOUS | Status: AC
  Administered 2016-03-06: 1000 mL via INTRAVENOUS

## 2016-03-06 MED ORDER — MECLIZINE HCL 25 MG PO TABS
25.0000 mg | ORAL_TABLET | Freq: Once | ORAL | Status: AC
  Administered 2016-03-06: 25 mg via ORAL
  Filled 2016-03-06: qty 1

## 2016-03-06 MED ORDER — MECLIZINE HCL 25 MG PO TABS: 25.0000 mg | ORAL_TABLET | Freq: Three times a day (TID) | ORAL | 0 refills | Status: DC | PRN

## 2016-03-06 NOTE — Discharge Instructions (Signed)
Take the prescribed medication as directed.  Recommend talking with your doctor about your cymbalta/duloxetine if you continue feel that it is making your sick. Follow-up with your primary care doctor. I have also given you referral to neurology if needed. Return to the ED for new or worsening symptoms.

## 2016-03-06 NOTE — ED Provider Notes (Signed)
Care asusmed from PA Laisure at shift change.  See her note for full H&P.  Briefly, 65 y.o. F with multiple complaints. Reports abdominal pain which is been present for the past month and is unchanged. States her primary care doctor is treating her for constipation. He also reports some dizziness with nausea and vomiting. States this seems to be somewhat positional and she "felt drunk".  No history of TIA or stroke.  Patient was treated for vertigo here without much improvement.  PA Laisure ambulated patient in hall-- patient was noted to be somewhat unsteady and reported continued dizziness.  Labs reassuring.  Plan:  MRI pending.  If negative, plan to d/c home with PCP follow-up.  Results for orders placed or performed during the hospital encounter of 03/06/16  Lipase, blood  Result Value Ref Range   Lipase 22 11 - 51 U/L  Comprehensive metabolic panel  Result Value Ref Range   Sodium 135 135 - 145 mmol/L   Potassium 4.1 3.5 - 5.1 mmol/L   Chloride 103 101 - 111 mmol/L   CO2 26 22 - 32 mmol/L   Glucose, Bld 275 (H) 65 - 99 mg/dL   BUN 11 6 - 20 mg/dL   Creatinine, Ser 0.75 0.44 - 1.00 mg/dL   Calcium 9.2 8.9 - 10.3 mg/dL   Total Protein 7.7 6.5 - 8.1 g/dL   Albumin 4.3 3.5 - 5.0 g/dL   AST 22 15 - 41 U/L   ALT 29 14 - 54 U/L   Alkaline Phosphatase 67 38 - 126 U/L   Total Bilirubin 0.8 0.3 - 1.2 mg/dL   GFR calc non Af Amer >60 >60 mL/min   GFR calc Af Amer >60 >60 mL/min   Anion gap 6 5 - 15  CBC  Result Value Ref Range   WBC 8.3 4.0 - 10.5 K/uL   RBC 4.55 3.87 - 5.11 MIL/uL   Hemoglobin 14.4 12.0 - 15.0 g/dL   HCT 41.6 36.0 - 46.0 %   MCV 91.4 78.0 - 100.0 fL   MCH 31.6 26.0 - 34.0 pg   MCHC 34.6 30.0 - 36.0 g/dL   RDW 13.4 11.5 - 15.5 %   Platelets 240 150 - 400 K/uL  Urinalysis, Routine w reflex microscopic  Result Value Ref Range   Color, Urine YELLOW YELLOW   APPearance CLEAR CLEAR   Specific Gravity, Urine 1.025 1.005 - 1.030   pH 5.0 5.0 - 8.0   Glucose, UA >1000  (A) NEGATIVE mg/dL   Hgb urine dipstick SMALL (A) NEGATIVE   Bilirubin Urine NEGATIVE NEGATIVE   Ketones, ur NEGATIVE NEGATIVE mg/dL   Protein, ur NEGATIVE NEGATIVE mg/dL   Nitrite NEGATIVE NEGATIVE   Leukocytes, UA NEGATIVE NEGATIVE  Urine microscopic-add on  Result Value Ref Range   Squamous Epithelial / LPF 0-5 (A) NONE SEEN   WBC, UA 0-5 0 - 5 WBC/hpf   RBC / HPF 0-5 0 - 5 RBC/hpf   Bacteria, UA RARE (A) NONE SEEN   Urine-Other MUCOUS PRESENT   CBG monitoring, ED  Result Value Ref Range   Glucose-Capillary 270 (H) 65 - 99 mg/dL  I-stat troponin, ED  Result Value Ref Range   Troponin i, poc 0.00 0.00 - 0.08 ng/mL   Comment 3           Mr Brain Wo Contrast  Result Date: 03/06/2016 CLINICAL DATA:  Initial valuation for dizziness with nausea and vomiting. EXAM: MRI HEAD WITHOUT CONTRAST TECHNIQUE: Multiplanar, multiecho pulse sequences  of the brain and surrounding structures were obtained without intravenous contrast. COMPARISON:  None. FINDINGS: Cerebral volume within normal limits for patient age. Minimal T2/FLAIR hyperintensity within the periventricular white matter, nonspecific, but most like related to minimal chronic small vessel ischemic disease. This is felt to be within normal limits for age. No abnormal foci of restricted diffusion to suggest acute or subacute ischemia. Gray-white matter differentiation well maintained. Major intracranial vascular flow voids are well preserved. No acute or chronic intracranial hemorrhage. No areas of chronic infarction. No mass lesion, midline shift, or mass effect. No hydrocephalus. No extra-axial fluid collection. Major dural sinuses are patent. Craniocervical junction normal. Visualized upper cervical spine unremarkable. Pituitary gland normal. No acute abnormality about the globes and orbits. Paranasal sinuses are clear. Trace opacity inferior right mastoid air cells, likely small amount of trapped fluid. Mastoids are otherwise clear. Inner ear  structures normal. Bone marrow signal intensity within normal limits. No scalp soft tissue abnormality. IMPRESSION: Normal brain MRI.  No acute intracranial process identified. Electronically Signed   By: Jeannine Boga M.D.   On: 03/06/2016 18:55   MRI negative for any acute findings.  Patient states she is feeling better at this time.  Her family member at bedside discloses to me that patient's doctor recently changed her from Zoloft to Cymbalta and her symptoms began after this. Patient reports she has discussed this with her primary care doctor as she had no issues while taking zoloft, however he did not want to switch her back. This may be the source of her dizziness as this is a known side effect.  Feel she is stable for discharge home. Advised her to discuss her medications with her primary care doctor. She was also given referral to neurology if symptoms continue.  Patient was also mildly hyperglycemic today. She is not a known diabetic. Advised her to monitor her diet including limiting carbohydrates and sugar intake to help better control this. Recommended recheck with her primary care doctor soon.    Discussed plan with patient, she acknowledged understanding and agreed with plan of care.  Return precautions given for new or worsening symptoms.   Larene Pickett, PA-C 03/06/16 2124    Milton Ferguson, MD 03/06/16 [phone]

## 2016-03-06 NOTE — ED Triage Notes (Signed)
Pt reports N/V starting last night and feelings of dizziness since 9/10.  Denies LOC or falls.  Denies diarrhea.  Reports constipation for last few days.  Denies any changes in urination.

## 2016-03-06 NOTE — ED Provider Notes (Signed)
[facility] DEPT Provider Note   CSN: [id number] Arrival date & time: 03/06/16  1117     History   Chief Complaint Chief Complaint  Patient presents with  . Nausea  . Abdominal Pain  . Near Syncope    HPI Jenna Wong is a 65 y.o. female.  Patient presents today with a few different complaints.  She is complaining of nausea, vomiting, dizziness, abdominal pain, and blurred vision.  She states that the abdominal pain has been present for the past month and is unchanged.  Pain located in the LLQ of the abdomen and does not radiate.  She states that she has been seen by her PCP for this and it was thought to be secondary to constipation.  She was then given a stool softener.  She denies any diarrhea, hematochezia, or melena.  She reports that her last BM was 2 days ago.  She does report that she had two episodes of vomiting last evening.  Nausea has improved somewhat at this time.    Patient is also complaining of dizziness.  She describes the dizziness as both a feeling like her room is spinning and feeling lightheaded.  She reports onset of these symptoms last evening.  She has been getting similar symptoms over the past 2 months, but reports that symptoms were worse last evening.  She states that she "felt drunk" when ambulating last evening even though she did not drink any alcohol.  She also felt like she was going to pass out, but did not actually pass out.  She reports that the dizziness is worse with standing and also when turning her head.  She denies headache, focal weakness, difficulty speaking, difficulty swallowing, or neck pain.  She denies fever, chills, chest pain, or SOB.  She does report that her vision seems blurry in both eyes.  Patient does report that she was recently changed from Zoloft to Celexa and thinks that this may be contributing to her symptoms.        Past Medical History:  Diagnosis Date  . Abnormal mammogram 12/00   repeat normal  . Abscess breast  7/80  . Breast abscess of female 07/18/2011  . Cervical disc displacement    diagnosed by MRI--sees dr Cyndy Freeze, evaluation by disability determination services for permanent disability 04/25/00  . Mental and behavioral problems with learning    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  . Spinal stenosis    MRI LS spine 9/09  . SPINAL STENOSIS, LUMBAR 06/22/2008    Patient Active Problem List   Diagnosis Date Noted  . Abdominal pain 01/19/2016  . Essential hypertension 12/21/2015  . Left breast abscess   . Acute mastitis of left breast 09/26/2015  . Mastitis, left, acute 09/26/2015  . Sebaceous cyst 02/19/2014  . Pre-diabetes 02/15/2012  . Irritable bowel syndrome (IBS) 10/10/2011  . Personal history of tobacco use 12/18/2010  . Depression with anxiety 04/27/2009  . Hyperlipidemia 12/13/2008  . SPINAL STENOSIS, LUMBAR 06/22/2008  . INSOMNIA, PERSISTENT 01/13/2007  . OBESITY, NOS 08/22/2006  . BACK PAIN, LOW 08/22/2006    Past Surgical History:  Procedure Laterality Date  . ABDOMINAL HYSTERECTOMY    . MYOMECTOMY      OB History    No data available       Home Medications    Prior to Admission medications   Medication Sig Start Date End Date Taking? Authorizing Provider  aspirin 81 MG tablet Take 81 mg by mouth daily.   Yes Historical Provider, MD  atorvastatin (LIPITOR) 40 MG tablet Take 1 tablet (40 mg total) by mouth daily. 12/21/15  Yes Elberta Leatherwood, MD  B Complex CAPS Take 2 capsules by mouth daily.   Yes Historical Provider, MD  DULoxetine (CYMBALTA) 30 MG capsule Take 1 capsule (30 mg total) by mouth daily. 12/21/15  Yes Elberta Leatherwood, MD  lisinopril (PRINIVIL,ZESTRIL) 10 MG tablet Take 1 tablet (10 mg total) by mouth daily. Patient taking differently: Take 10 mg by mouth daily as needed (high blood pressure).  12/21/15  Yes Elberta Leatherwood, MD  MELATONIN PO Take 1 capsule by mouth at bedtime as needed (sleep).   Yes Historical Provider, MD  polyethylene glycol powder (GLYCOLAX/MIRALAX) powder  Take 17 g by mouth daily. 01/19/16  Yes Elberta Leatherwood, MD  ondansetron (ZOFRAN) 4 MG tablet Take 1 tablet (4 mg total) by mouth every 6 (six) hours as needed for nausea. Patient not taking: Reported on 03/06/2016 09/28/15   Nicolette Bang, DO  oxyCODONE (OXY IR/ROXICODONE) 5 MG immediate release tablet Take 1 tablet (5 mg total) by mouth every 4 (four) hours as needed for severe pain. Patient not taking: Reported on 03/06/2016 12/21/15   Elberta Leatherwood, MD    Family History Family History  Problem Relation Age of Onset  . Parkinsonism Brother   . Dementia Brother   . Hypertension Mother   . Hypertension Father   . Depression Sister   . Diabetes Sister   . Stroke Sister     Social History Social History  Substance Use Topics  . Smoking status: Current Every Day Smoker    Packs/day: 1.00    Years: 30.00    Types: Cigarettes  . Smokeless tobacco: Never Used     Comment: thinking about it  . Alcohol use Yes     Comment: 1 x per month     Allergies   Review of patient's allergies indicates no known allergies.   Review of Systems Review of Systems  All other systems reviewed and are negative.    Physical Exam Updated Vital Signs BP 114/67 (BP Location: Left Arm)   Pulse 76   Temp 97.8 F (36.6 C)   Resp 18   Ht 5\' 7"  (1.702 m)   Wt 90.7 kg   SpO2 97%   BMI 31.32 kg/m   Physical Exam  Constitutional: She appears well-developed and well-nourished.  HENT:  Head: Normocephalic and atraumatic.  Mouth/Throat: Oropharynx is clear and moist.  Eyes: EOM are normal. Pupils are equal, round, and reactive to light.  Neck: Normal range of motion. Neck supple.  Cardiovascular: Normal rate, regular rhythm and normal heart sounds.   Pulmonary/Chest: Effort normal and breath sounds normal.  Abdominal: Soft. Bowel sounds are normal. She exhibits no distension and no mass. There is no rebound and no guarding. No hernia.  Mild tenderness to palpation of the LLQ    Musculoskeletal: Normal range of motion.  Neurological: She is alert. She has normal strength. No cranial nerve deficit or sensory deficit.  Patient unsteady with ambulating  Skin: Skin is warm and dry.  Psychiatric: She has a normal mood and affect.  Nursing note and vitals reviewed.    ED Treatments / Results  Labs (all labs ordered are listed, but only abnormal results are displayed) Labs Reviewed  COMPREHENSIVE METABOLIC PANEL - Abnormal; Notable for the following:       Result Value   Glucose, Bld 275 (*)    All other components within  normal limits  URINALYSIS, ROUTINE W REFLEX MICROSCOPIC (NOT AT [facility]) - Abnormal; Notable for the following:    Glucose, UA >1000 (*)    Hgb urine dipstick SMALL (*)    All other components within normal limits  URINE MICROSCOPIC-ADD ON - Abnormal; Notable for the following:    Squamous Epithelial / LPF 0-5 (*)    Bacteria, UA RARE (*)    All other components within normal limits  CBG MONITORING, ED - Abnormal; Notable for the following:    Glucose-Capillary 270 (*)    All other components within normal limits  LIPASE, BLOOD  CBC  I-STAT TROPOININ, ED    EKG  EKG Interpretation  Date/Time:  Tuesday March 06 2016 13:24:24 EDT Ventricular Rate:  69 PR Interval:    QRS Duration: 79 QT Interval:  387 QTC Calculation: 415 R Axis:   86 Text Interpretation:  Sinus rhythm Borderline right axis deviation Low voltage, precordial leads No significant change since last tracing Confirmed by Winfred Leeds  MD, SAM [phone]) on 03/06/2016 1:32:19 PM       Radiology No results found.  Procedures Procedures (including critical care time)  Medications Ordered in ED Medications  diazepam (VALIUM) injection 5 mg (not administered)  sodium chloride 0.9 % bolus 1,000 mL (1,000 mLs Intravenous New Bag/Given 03/06/16 1341)  meclizine (ANTIVERT) tablet 25 mg (25 mg Oral Given 03/06/16 1341)  ondansetron (ZOFRAN) injection 4 mg (4 mg Intravenous  Given 03/06/16 1341)     Initial Impression / Assessment and Plan / ED Course  I have reviewed the triage vital signs and the nursing notes.  Pertinent labs & imaging results that were available during my care of the patient were reviewed by me and considered in my medical decision making (see chart for details).  Clinical Course      Final Clinical Impressions(s) / ED Diagnoses   Final diagnoses:  None   Patient presents today with complaints of dizziness, which she describes as both the room spinning and lightheadedness.  Onset of symptoms last evening.  She does have an unsteady gait and reports worsening of symptoms with standing.  She feels that symptoms may be related to recent change in her antidepressant.  Labs unremarkable.  No ischemic changes on EKG.  Patient given IVF and Meclizine with some improvement in symptoms, but continued to feel dizzy and was unsteady with ambulating.  IV Valium ordered and MRI brain.  Quincy Carnes, PA-C will follow up on the results of the MRI.  Patient also complaining of abdominal pain that has been present for a month.  She states PCP is treating her for constipation.  No worsening of the abdominal pain at this time.  No rebound or guarding.  No fever or diarrhea.  Feel that the patient is stable for discharge.  Return precautions given.    New Prescriptions New Prescriptions   No medications on file     Hyman Bible, PA-C 03/09/16 [place], MD 03/10/16 [phone]

## 2016-03-06 NOTE — ED Notes (Signed)
Pt ambulatory and independent at discharge.  Verbalized understanding of discharge instructions 

## 2016-03-06 NOTE — ED Notes (Signed)
Patient transported to MRI 

## 2016-03-21 ENCOUNTER — Ambulatory Visit: Payer: Self-pay | Admitting: Neurology

## 2016-03-27 ENCOUNTER — Encounter: Payer: Self-pay | Admitting: Neurology

## 2016-03-27 ENCOUNTER — Ambulatory Visit (INDEPENDENT_AMBULATORY_CARE_PROVIDER_SITE_OTHER): Payer: Medicare Other | Admitting: Neurology

## 2016-03-27 VITALS — BP 130/80 | HR 68 | Ht 68.0 in | Wt 216.8 lb

## 2016-03-27 DIAGNOSIS — I10 Essential (primary) hypertension: Secondary | ICD-10-CM

## 2016-03-27 DIAGNOSIS — M48061 Spinal stenosis, lumbar region without neurogenic claudication: Secondary | ICD-10-CM

## 2016-03-27 DIAGNOSIS — R42 Dizziness and giddiness: Secondary | ICD-10-CM | POA: Diagnosis not present

## 2016-03-27 DIAGNOSIS — F418 Other specified anxiety disorders: Secondary | ICD-10-CM | POA: Diagnosis not present

## 2016-03-27 NOTE — Progress Notes (Signed)
PATIENT: Jenna Wong DOB: 06-17-1951  Chief Complaint  Patient presents with  . Dizziness    Lying: 121/79, 68, Sitting: 131/81, 73, Standing: 130/84, 78.  She is here with her sister, Danton Clap.  Reports intermittent dizziness that started on 03/04/16.  She was treated in the ED and had a normal MRI.  Her symptoms are worse when getting up to a standing position.     HISTORICAL  Jenna Wong is a 65 years old right-handed female, accompanied by her sister Danton Clap, seen in refer by emergency room for evaluation of dizziness, initial evaluation was October third 2017, her primary care physician is Dr. 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  I personally reviewed MRI of the brain on March 06 2016, there was no significant abnormality, laboratories evaluation showed negative troponin, CBC with hemoglobin of 14 point 4, CMP with elevated glucose 275, creatinine of 0.75, elevated A1c of 7.6,  Patient is a poor historian, had learning disability during her school years, she denies significant low pressure issues, contributed her symptoms to her blood pressure medications, blood pressure was 130/80, no orthostatic blood pressure changes  noticed.   REVIEW OF SYSTEMS: Full 14 system review of systems performed and notable only for weight gain, blurry vision, constipation, joint pain, achy muscles, slurred speech, dizziness, passing out, depression, anxiety, not enough sleep, decreased energy  ALLERGIES: No Known Allergies  HOME MEDICATIONS: Current Outpatient Prescriptions  Medication Sig Dispense Refill  . aspirin 81 MG tablet Take 81 mg by mouth daily.    Marland Kitchen atorvastatin (LIPITOR) 40 MG tablet Take 1 tablet (40 mg total) by mouth daily. 90 tablet 3  . B Complex CAPS Take 2 capsules by mouth daily.    Marland Kitchen lisinopril (PRINIVIL,ZESTRIL) 10 MG tablet Take 1 tablet (10 mg total) by mouth daily. (Patient taking differently: Take 10 mg by mouth daily as needed (high blood pressure). ) 90 tablet 3  . meclizine (ANTIVERT) 25 MG tablet Take 1 tablet (25 mg total) by mouth 3 (three) times daily as needed for dizziness. 30 tablet 0  . MELATONIN PO Take 1 capsule by mouth at bedtime as needed (sleep).    . ondansetron (ZOFRAN) 4 MG tablet Take 1 tablet (4 mg total) by mouth every 6 (six) hours as needed for nausea. 10 tablet 0  . oxyCODONE (OXY IR/ROXICODONE) 5 MG immediate release tablet Take 1 tablet (5 mg total) by mouth every 4 (four) hours as needed for severe pain. 30 tablet 0  . polyethylene glycol powder (GLYCOLAX/MIRALAX) powder Take 17 g by mouth daily. 3350 g 1  . sertraline (ZOLOFT) 25 MG tablet Take 25 mg by mouth daily.     No current facility-administered medications for this visit.     PAST MEDICAL HISTORY: Past Medical History:  Diagnosis Date  .  Abnormal mammogram 12/00   repeat normal  . Abscess breast 7/80  . Anxiety   . Breast abscess of female 07/18/2011  . Cervical disc displacement    diagnosed by MRI--sees dr Cyndy Freeze, evaluation by disability determination services for permanent disability 04/25/00  . Depression   . Dizziness   . Mental and behavioral problems with learning    verbal IQ 39  . Spinal  stenosis    MRI LS spine 9/09  . SPINAL STENOSIS, LUMBAR 06/22/2008    PAST SURGICAL HISTORY: Past Surgical History:  Procedure Laterality Date  . ABDOMINAL HYSTERECTOMY    . MYOMECTOMY      FAMILY HISTORY: Family History  Problem Relation Age of Onset  . Parkinsonism Brother   . Dementia Brother   . Hypertension Mother   . Hypertension Father   . Depression Sister   . Diabetes Sister   . Stroke Sister     SOCIAL HISTORY:  Social History   Social History  . Marital status: Single    Spouse name: N/A  . Number of children: 0  . Years of education: 12   Occupational History  . steam roller operator Other    short term disability for LBP 11/2008 for lumbar stenosis and unable to perform physically demanding job   Social History Main Topics  . Smoking status: Current Every Day Smoker    Packs/day: 1.00    Years: 30.00    Types: Cigarettes  . Smokeless tobacco: Never Used     Comment: thinking about it  . Alcohol use Yes     Comment: 1 x per month  . Drug use: No  . Sexual activity: Not Currently    Birth control/ protection: Other-see comments     Comment: has had hysterectomy- no sexual partner x 2 years   Other Topics Concern  . Not on file   Social History Narrative    hopes to start fostering children.  Single. Never married.  No children.  Graduated from high school- had learning disability, limited ability to read, able to write.       Health Care POA:    Emergency Contact: sister, Queen Slough [phone]   End of Life Plan:    Who lives with you: sister, Danton Clap   Any pets: sister has dog, lab   Diet: Pt has a variety of protein, starch, vegetables.   Exercise: Pt has no regular exercise routine.   Seatbelts: Pt reports wearing seatbelt when in vehicles.    Right-handed.   3 cups coffee per day.              PHYSICAL EXAM   Vitals:   03/27/16 1503  BP: 121/79  Pulse: 68  Weight: 216 lb 12 oz (98.3 kg)  Height: 5\' 8"  (1.727 m)    Not  recorded      Body mass index is 32.96 kg/m.  PHYSICAL EXAMNIATION:  Gen: NAD, conversant, well nourised, obese, well groomed                     Cardiovascular: Regular rate rhythm, no peripheral edema, warm, nontender. Eyes: Conjunctivae clear without exudates or hemorrhage Neck: Supple, no carotid bruise. Pulmonary: Clear to auscultation bilaterally   NEUROLOGICAL EXAM:  MENTAL STATUS: Speech:    Speech is normal; fluent and spontaneous with normal comprehension.  Cognition:     Orientation to time, place and person     Normal recent and remote memory  Normal Attention span and concentration     Normal Language, naming, repeating,spontaneous speech     Fund of knowledge   CRANIAL NERVES: CN II: Visual fields are full to confrontation. Fundoscopic exam is normal with sharp discs and no vascular changes. Pupils are round equal and briskly reactive to light. CN III, IV, VI: extraocular movement are normal. No ptosis. CN V: Facial sensation is intact to pinprick in all 3 divisions bilaterally. Corneal responses are intact.  CN VII: Face is symmetric with normal eye closure and smile. CN VIII: Hearing is normal to rubbing fingers CN IX, X: Palate elevates symmetrically. Phonation is normal. CN XI: Head turning and shoulder shrug are intact CN XII: Tongue is midline with normal movements and no atrophy.  MOTOR: There is no pronator drift of out-stretched arms. Muscle bulk and tone are normal. Muscle strength is normal.  REFLEXES: Reflexes are 2+ and symmetric at the biceps, triceps, knees, and ankles. Plantar responses are flexor.  SENSORY: Intact to light touch, pinprick, positional sensation and vibratory sensation are intact in fingers and toes.  COORDINATION: Rapid alternating movements and fine finger movements are intact. There is no dysmetria on finger-to-nose and heel-knee-shin.    GAIT/STANCE: Posture is normal. Gait is steady with normal steps, base, arm  swing, and turning. Heel and toe walking are normal. Tandem gait is normal.  Romberg is absent.   DIAGNOSTIC DATA (LABS, IMAGING, TESTING) - I reviewed patient records, labs, notes, testing and imaging myself where available.   ASSESSMENT AND PLAN  Jenna Wong is a 65 y.o. female   Intermittent dizziness,  History suggestive of orthostatic blood pressure changes,  She does has history of diabetes, A1c was 7.6, she complains of bilateral feet paresthesia, suggestive of small fiber neuropathy  I have suggested her keep well hydration, document her blood pressure on a daily basis, continue follow-up with her primary care physician  MRI of the brain is normal, no significant abnormality on neurological examinations   Marcial Pacas, M.D. Ph.D.  [facility] [address] [place], [place] [address] Ph: [phone] Fax: [phone]  CC: Elberta Leatherwood, MD

## 2016-04-28 ENCOUNTER — Other Ambulatory Visit: Payer: Self-pay | Admitting: Family Medicine

## 2016-08-03 ENCOUNTER — Ambulatory Visit (INDEPENDENT_AMBULATORY_CARE_PROVIDER_SITE_OTHER): Payer: Medicare Other | Admitting: Family Medicine

## 2016-08-03 ENCOUNTER — Other Ambulatory Visit: Payer: Self-pay | Admitting: Family Medicine

## 2016-08-03 ENCOUNTER — Ambulatory Visit
Admission: RE | Admit: 2016-08-03 | Discharge: 2016-08-03 | Disposition: A | Discharge status: Home / Self Care | Payer: Medicare Other | Source: Ambulatory Visit | Attending: Family Medicine | Admitting: Family Medicine

## 2016-08-03 ENCOUNTER — Encounter: Payer: Self-pay | Admitting: Family Medicine

## 2016-08-03 ENCOUNTER — Ambulatory Visit: Payer: Medicare Other | Admitting: Family Medicine

## 2016-08-03 VITALS — BP 128/82 | HR 90 | Temp 98.3°F | Ht 68.0 in | Wt 216.4 lb

## 2016-08-03 DIAGNOSIS — Z23 Encounter for immunization: Secondary | ICD-10-CM | POA: Diagnosis not present

## 2016-08-03 DIAGNOSIS — N61 Mastitis without abscess: Secondary | ICD-10-CM | POA: Diagnosis not present

## 2016-08-03 MED ORDER — DOXYCYCLINE HYCLATE 100 MG PO TABS: 100.0000 mg | ORAL_TABLET | Freq: Two times a day (BID) | ORAL | 0 refills | Status: DC

## 2016-08-03 NOTE — Assessment & Plan Note (Signed)
Concerns for left breast abscess. Left breast tender to palpation and has mucopurulent nipple discharge. No signs of erythema although the nipple area is enlarged and  indurated.Patient has had a breast abscess in the past and was hospitalized for this and given IV antibiotics in April 2017. Discussed patient with general surgeon on call at [facility] who suggested to call their office and schedule an exam. [place] surgery office and they stated that I need to call the breast center. Called the breast center and they will see her for a diagnostic breast ultrasound and mammogram. -Doxycycline 100 mg twice a day 14 days -Patient will follow-up with mammogram ultrasound results -Discussed reasons to go to hospital

## 2016-08-03 NOTE — Progress Notes (Signed)
Subjective:    Patient ID: Jenna Wong , female   DOB: [birth year] , 66 y.o..   MRN: [id number]  HPI  Jenna Wong is here for  Chief Complaint  Patient presents with  . Breast Pain   Left Breast Pain:   Breast pain has been going on for the last  3 days, it is throbbing and tender to touch.  The pain is on the breast area near the nipple She has not taken any medication for the pain.  No new medications that can recall.  She notes that she has had surgery in the past on her breasts but she does not remember which surgeries.  Left nipple discharge x 1 day, it is dark white Denies any current fevers, shortness of breath.  Has a hx of left mastitis and left breast abscess. She was hospitalized last year for this.  She notes that she has a 3rd cousin that died from breast cancer when she was 64 yo   Review of Systems: Per HPI. All other systems reviewed and are negative.  Past Medical History: Patient Active Problem List   Diagnosis Date Noted  . Dizziness 03/27/2016  . Abdominal pain 01/19/2016  . Essential hypertension 12/21/2015  . Left breast abscess   . Acute mastitis of left breast 09/26/2015  . Mastitis, left, acute 09/26/2015  . Sebaceous cyst 02/19/2014  . Pre-diabetes 02/15/2012  . Irritable bowel syndrome (IBS) 10/10/2011  . Personal history of tobacco use 12/18/2010  . Depression with anxiety 04/27/2009  . Hyperlipidemia 12/13/2008  . SPINAL STENOSIS, LUMBAR 06/22/2008  . INSOMNIA, PERSISTENT 01/13/2007  . OBESITY, NOS 08/22/2006  . BACK PAIN, LOW 08/22/2006    Medications: reviewed and updated Current Outpatient Prescriptions  Medication Sig Dispense Refill  . aspirin 81 MG tablet Take 81 mg by mouth daily.    Marland Kitchen atorvastatin (LIPITOR) 40 MG tablet Take 1 tablet (40 mg total) by mouth daily. 90 tablet 3  . B Complex CAPS Take 2 capsules by mouth daily.    Marland Kitchen doxycycline (VIBRA-TABS) 100 MG tablet Take 1 tablet (100 mg total) by mouth 2 (two) times  daily. 20 tablet 0  . lisinopril (PRINIVIL,ZESTRIL) 10 MG tablet Take 1 tablet (10 mg total) by mouth daily. (Patient taking differently: Take 10 mg by mouth daily as needed (high blood pressure). ) 90 tablet 3  . meclizine (ANTIVERT) 25 MG tablet Take 1 tablet (25 mg total) by mouth 3 (three) times daily as needed for dizziness. 30 tablet 0  . MELATONIN PO Take 1 capsule by mouth at bedtime as needed (sleep).    . ondansetron (ZOFRAN) 4 MG tablet Take 1 tablet (4 mg total) by mouth every 6 (six) hours as needed for nausea. 10 tablet 0  . oxyCODONE (OXY IR/ROXICODONE) 5 MG immediate release tablet Take 1 tablet (5 mg total) by mouth every 4 (four) hours as needed for severe pain. 30 tablet 0  . polyethylene glycol powder (GLYCOLAX/MIRALAX) powder Take 17 g by mouth daily. 3350 g 1  . sertraline (ZOLOFT) 25 MG tablet Take 25 mg by mouth daily.     No current facility-administered medications for this visit.     Social Hx:  reports that she has been smoking Cigarettes.  She has a 30.00 pack-year smoking history. She has never used smokeless tobacco.   Objective:   BP 128/82   Pulse 90   Temp 98.3 F (36.8 C) (Oral)   Ht 5\' 8"  (1.727 m)  Wt 216 lb 6.4 oz (98.2 kg)   SpO2 97%   BMI 32.90 kg/m   Physical Exam  Constitutional: She appears well-developed and well-nourished.  HENT:  Head: Normocephalic.  Right Ear: External ear normal.  Left Ear: External ear normal.  Nose: Nose normal.  Mouth/Throat: Oropharynx is clear and moist.  Cardiovascular: Normal rate and regular rhythm.   Pulmonary/Chest: Effort normal and breath sounds normal. Right breast exhibits no inverted nipple, no mass, no nipple discharge, no skin change and no tenderness. Left breast exhibits nipple discharge (mucopurulent) and tenderness. Left breast exhibits no inverted nipple, no mass and no skin change. Breasts are symmetrical. There is breast swelling (left nipple and inferior areola).  Genitourinary: No breast  bleeding.  Skin: Skin is warm. Capillary refill takes less than 2 seconds.  Psychiatric: She has a normal mood and affect. Her behavior is normal.    Assessment & Plan:  Mastitis, left, acute Concerns for left breast abscess. Left breast tender to palpation and has mucopurulent nipple discharge. No signs of erythema although the nipple area is enlarged and  indurated.Patient has had a breast abscess in the past and was hospitalized for this and given IV antibiotics in April 2017. Discussed patient with general surgeon on call at [facility] who suggested to call their office and schedule an exam. [place] surgery office and they stated that I need to call the breast center. Called the breast center and they will see her for a diagnostic breast ultrasound and mammogram. -Doxycycline 100 mg twice a day 14 days -Patient will follow-up with mammogram ultrasound results -Discussed reasons to go to hospital    Smitty Cords, MD [place], PGY-2

## 2016-08-03 NOTE — Progress Notes (Deleted)
   Subjective:   Patient ID: Jenna Wong    DOB: May 21, 1951, 66 y.o. female   MRN: [id number]  CC: breast pain  HPI: Jenna Wong is a 66 y.o. female who presents to clinic today with breast pain.  She is here with ***.    History of recurrent breast abscesses, hyperlipidemia, Type 2 DM, depression and tobacco use.  Was admitted to the hospital in April 2017 for left breast abscess and treatment with IV antibiotics after failure with outpatient po doxycycline.  Underwent aspiration of abscess as well.  Symptoms had since resolved.  *** Have symptoms since recurred? Been on abx?  Normal screening mammogram in December 2016.   Counsel smoking cessation especially given predisposition to recurrent infections.    ROS: See HPI for pertinent ROS.  Circle: Pertinent past medical, surgical, family, and social history were reviewed and updated as appropriate. Smoking status reviewed.  Medications reviewed.  Objective:   There were no vitals taken for this visit. Vitals and nursing note reviewed.  General: well nourished, well developed, in no acute distress with non-toxic appearance HEENT: normocephalic, atraumatic, moist mucous membranes Neck: supple, non-tender without lymphadenopathy CV: regular rate and rhythm without murmurs rubs or gallops Lungs: clear to auscultation bilaterally with normal work of breathing Abdomen: soft, non-tender, no masses or organomegaly palpable, normoactive bowel sounds Skin: warm, dry, no rashes or lesions, cap refill < 2 seconds Extremities: warm and well perfused, normal tone  Assessment & Plan:   No problem-specific Assessment & Plan notes found for this encounter.  No orders of the defined types were placed in this encounter.  No orders of the defined types were placed in this encounter.   Lovenia Kim, MD [place], PGY-1 08/03/2016 8:43 AM

## 2016-08-03 NOTE — Patient Instructions (Signed)
Thank you for coming in today, it was so nice to see you! Today we talked about:    Breast pain: This is probably another infection. We will need you to take antibiotics and get imaging of your breasts  Please go to the breast center at your scheduled time  Go to the hospital if you have a fever, chills, or not feeling well  If we ordered any tests today, you will be notified via telephone of any abnormalities. If everything is normal you will get a letter in the mail.   If you have any questions or concerns, please do not hesitate to call the office at [phone]. You can also message me directly via MyChart.   Sincerely,  Smitty Cords, MD

## 2016-08-07 ENCOUNTER — Telehealth: Payer: Self-pay | Admitting: Family Medicine

## 2016-08-07 ENCOUNTER — Ambulatory Visit
Admission: RE | Admit: 2016-08-07 | Discharge: 2016-08-07 | Disposition: A | Discharge status: Home / Self Care | Payer: Medicare Other | Source: Ambulatory Visit | Attending: Family Medicine | Admitting: Family Medicine

## 2016-08-07 ENCOUNTER — Other Ambulatory Visit: Payer: Self-pay | Admitting: Family Medicine

## 2016-08-07 DIAGNOSIS — N61 Mastitis without abscess: Secondary | ICD-10-CM

## 2016-08-07 DIAGNOSIS — F32A Depression, unspecified: Secondary | ICD-10-CM

## 2016-08-07 DIAGNOSIS — F329 Major depressive disorder, single episode, unspecified: Secondary | ICD-10-CM

## 2016-08-07 NOTE — Telephone Encounter (Signed)
Patient states she is returning a call to Dr. Alease Frame pertaining to medications. No notes in chart. Please advise.

## 2016-08-08 NOTE — Telephone Encounter (Signed)
Called patient to discuss SSRI dosing (a refill request was sent to me recently). Asked that she call back with an ideal time to call on Friday.   Also, please ask if details can be left on VM if/when she calls back.

## 2016-08-10 ENCOUNTER — Other Ambulatory Visit: Payer: Self-pay | Admitting: Family Medicine

## 2016-08-10 ENCOUNTER — Ambulatory Visit
Admission: RE | Admit: 2016-08-10 | Discharge: 2016-08-10 | Disposition: A | Discharge status: Home / Self Care | Payer: Medicare Other | Source: Ambulatory Visit | Attending: Family Medicine | Admitting: Family Medicine

## 2016-08-10 DIAGNOSIS — N61 Mastitis without abscess: Secondary | ICD-10-CM

## 2016-08-12 LAB — AEROBIC/ANAEROBIC CULTURE (SURGICAL/DEEP WOUND)

## 2016-08-12 LAB — AEROBIC/ANAEROBIC CULTURE W GRAM STAIN (SURGICAL/DEEP WOUND)

## 2016-08-13 NOTE — Telephone Encounter (Signed)
Patient has appointment scheduled for 2/21 with PCP.

## 2016-08-13 NOTE — Telephone Encounter (Signed)
2nd request.  Shahrzad Koble L, RN  

## 2016-08-13 NOTE — Telephone Encounter (Signed)
Patient needs an appointment. I've been trying to get this issue worked out over the phone but without any luck. The dosing of her Zoloft in her chart is COMPLETELY different from the dosing I am receiving requests for, and these differences are not small (thus unsafe to get wrong).  Patient needs an appointment to get this corrected. Absolutely no SSRI/SNRI will be filled until that occurs.

## 2016-08-15 ENCOUNTER — Encounter: Payer: Self-pay | Admitting: Family Medicine

## 2016-08-15 ENCOUNTER — Ambulatory Visit (INDEPENDENT_AMBULATORY_CARE_PROVIDER_SITE_OTHER): Payer: Medicare Other | Admitting: Family Medicine

## 2016-08-15 DIAGNOSIS — F418 Other specified anxiety disorders: Secondary | ICD-10-CM

## 2016-08-15 MED ORDER — SERTRALINE HCL 50 MG PO TABS: ORAL_TABLET | ORAL | 5 refills | Status: DC

## 2016-08-15 NOTE — Progress Notes (Signed)
   HPI  CC: Medication refill Patient is here for medication refill. She was asked to schedule an appointment today by me, her PCP, as there was some inconsistencies with the dosage of her antidepressant. Patient states that she is supposed to be on Zoloft. She has not been on this medication for "at least a month or 2". She states that she would like to go back on this medication as it seemed to control her anxiety and depression significantly better than now since she is off. She states that she believes the confusion occurred because she was following up with other physicians who were titrating her Zoloft up. She denies any other symptoms at this time. No SI/HI  Review of Systems    See HPI for ROS. All other systems reviewed and are negative.  CC, SH/smoking status, and VS noted  Objective: BP 130/78   Pulse 83   Temp 98.3 F (36.8 C) (Oral)   Ht 5\' 8"  (1.727 m)   Wt 218 lb (98.9 kg)   BMI 33.15 kg/m  Gen: NAD, alert, cooperative, and pleasant. HEENT: NCAT, EOMI, PERRL CV: RRR, no murmur Resp: CTAB, no wheezes, non-labored Ext: No edema, warm Neuro: Alert and oriented, Speech clear, No gross deficits  Assessment and plan:  Depression with anxiety Worsening: Patient has not been on her Zoloft for at least a month. I asked patient to come in today due to inconsistencies with Zoloft dosage. Patient states that she was under good control with her anxiety and depression while on a total of 150 mg daily of Zoloft. - Zoloft refilled. [facility] instructions to titrate up 250 mg daily.  - 50 mg 1 week  - 50 mg twice a day the following week  - Then 100 mg in the morning and 50 mg at night after that. - Patient to follow-up in 4 weeks   Meds ordered this encounter  Medications  . sertraline (ZOLOFT) 50 MG tablet    Sig: 1 tablet a day for 1 week. Then 1 tablet twice a day for 1 week. Then 2 tablets in the morning and 1 tablets at night after that.    Dispense:  90 tablet   Refill:  5     Elberta Leatherwood, MD,MS,  PGY3 08/15/2016 2:32 PM

## 2016-08-15 NOTE — Patient Instructions (Signed)
It was a pleasure seeing you today in our clinic. Today we discussed your medications. Here is the treatment plan we have discussed and agreed upon together:   - I put you back on Zoloft. Please follow the following instructions while you titrate back up to your previous dose:  - Take 1 tablet once a day over the next week.  - Starting on 08/22/16, begin taking one tablet 2 times a day.  - Then, starting 08/29/16, begin taking 2 tablets in the morning and 1 tablet at night.

## 2016-08-15 NOTE — Assessment & Plan Note (Signed)
Worsening: Patient has not been on her Zoloft for at least a month. I asked patient to come in today due to inconsistencies with Zoloft dosage. Patient states that she was under good control with her anxiety and depression while on a total of 150 mg daily of Zoloft. - Zoloft refilled. [facility] instructions to titrate up 250 mg daily.  - 50 mg 1 week  - 50 mg twice a day the following week  - Then 100 mg in the morning and 50 mg at night after that. - Patient to follow-up in 4 weeks

## 2016-08-20 ENCOUNTER — Ambulatory Visit
Admission: RE | Admit: 2016-08-20 | Discharge: 2016-08-20 | Disposition: A | Discharge status: Home / Self Care | Payer: Medicare Other | Source: Ambulatory Visit | Attending: Family Medicine | Admitting: Family Medicine

## 2016-08-20 ENCOUNTER — Other Ambulatory Visit: Payer: Self-pay | Admitting: Family Medicine

## 2016-08-20 DIAGNOSIS — N61 Mastitis without abscess: Secondary | ICD-10-CM

## 2016-08-27 ENCOUNTER — Ambulatory Visit
Admission: RE | Admit: 2016-08-27 | Discharge: 2016-08-27 | Disposition: A | Discharge status: Home / Self Care | Payer: Medicare Other | Source: Ambulatory Visit | Attending: Family Medicine | Admitting: Family Medicine

## 2016-08-27 DIAGNOSIS — N61 Mastitis without abscess: Secondary | ICD-10-CM

## 2016-09-06 ENCOUNTER — Ambulatory Visit: Payer: Medicare Other | Admitting: *Deleted

## 2016-09-28 ENCOUNTER — Telehealth: Payer: Self-pay | Admitting: Family Medicine

## 2016-09-28 NOTE — Telephone Encounter (Signed)
Last office visit patient advised to come in for follow-up on 09/12/16. Appointment scheduled for 10/09/16.

## 2016-10-08 ENCOUNTER — Telehealth: Payer: Self-pay | Admitting: Family Medicine

## 2016-10-08 NOTE — Telephone Encounter (Signed)
No answer, LMOVM. Called to confirm appointment for 10/09/16. Patient was advised to arrive early for check-in and to bring any current medications.

## 2016-10-09 ENCOUNTER — Encounter: Payer: Self-pay | Admitting: Family Medicine

## 2016-10-09 ENCOUNTER — Ambulatory Visit (INDEPENDENT_AMBULATORY_CARE_PROVIDER_SITE_OTHER): Payer: Medicare Other | Admitting: Family Medicine

## 2016-10-09 VITALS — BP 106/76 | HR 88 | Temp 98.3°F | Ht 68.0 in | Wt 218.0 lb

## 2016-10-09 DIAGNOSIS — F418 Other specified anxiety disorders: Secondary | ICD-10-CM

## 2016-10-09 DIAGNOSIS — Z87891 Personal history of nicotine dependence: Secondary | ICD-10-CM | POA: Diagnosis not present

## 2016-10-09 DIAGNOSIS — B351 Tinea unguium: Secondary | ICD-10-CM | POA: Diagnosis not present

## 2016-10-09 MED ORDER — NICOTINE POLACRILEX 4 MG MT LOZG: 4.0000 mg | LOZENGE | OROMUCOSAL | 2 refills | Status: DC | PRN

## 2016-10-09 MED ORDER — BUPROPION HCL ER (SR) 150 MG PO TB12: ORAL_TABLET | ORAL | 2 refills | Status: DC

## 2016-10-09 NOTE — Progress Notes (Signed)
   HPI  CC: Smoking cessation and depression Patient is here for information on smoking cessation. She states that she is ready to quit and would like some help doing so. Patient has been smoking for the past 38 years, 1 pack a day. She states that her motivation for quitting smoking is for her health and she just feels like it's time to quit. Patient has attempted to quit smoking in the past but had not had the ability due to a loss and motivation. Patient would like to quit by the end of May. Patient is understanding that this is going to be an uphill battle. Previous attempts patient had used the nicotine gum and patches.  Depression: Patient states that her current Zoloft dosing has been working well. She has no complaints at this time. Denies SI/HI. No adverse side effects at this time. She is very pleased with her current medication regimen.  ROS: Denies worsening/persistent cough, headache, fever, chills, shortness of breath, hemoptysis, chest pain, nausea, vomiting, diarrhea, achiness, fatigue, numbness, or paresthesias.  CC, SH/smoking status, and VS noted  Objective: BP 106/76 (BP Location: Left Arm, Patient Position: Sitting, Cuff Size: Large)   Pulse 88   Temp 98.3 F (36.8 C) (Oral)   Ht 5\' 8"  (1.727 m)   Wt 218 lb (98.9 kg)   SpO2 96%   BMI 33.15 kg/m  Gen: NAD, alert, cooperative. CV: RRR, no murmur Resp: CTAB, no wheezes, non-labored Ext: No edema, warm Neuro: Alert and oriented, Speech clear, No gross deficits   Assessment and plan:  Personal history of tobacco use Ready to quit: Patient states she is ready to quit smoking. She has a 38-pack-year history. She states that she would like to have quit by the end of May. - Strong encouragement/positive reinforcement - Smoking cessation call line business card provided. - Nicotine replacement lozenges when necessary - Wellbutrin 150 mg daily 3 days, then twice a day after that. (Patient advised to start this  approximately 2 weeks prior to her quit date goal) - I have asked patient to set up a follow-up appointment 1-4 weeks after her quit date goal.  Depression with anxiety Stable/improved: Patient states that she feels well on her current Zoloft dosing. No issues at this time. Denies SI/HI. - Continue current medication regimen.   Orders Placed This Encounter  Procedures  . Ambulatory referral to Podiatry    Referral Priority:   Routine    Referral Type:   Consultation    Referral Reason:   Specialty Services Required    Requested Specialty:   Podiatry    Number of Visits Requested:   1    Meds ordered this encounter  Medications  . buPROPion (WELLBUTRIN SR) 150 MG 12 hr tablet    Sig: Take 1 tablet (150mg ) daily for the first 3 days. Then take 1 tablet (150mg ) twice a day (at least 8 hours apart) after that.    Dispense:  60 tablet    Refill:  2  . nicotine polacrilex (COMMIT) 4 MG lozenge    Sig: Take 1 lozenge (4 mg total) by mouth as needed for smoking cessation.    Dispense:  108 tablet    Refill:  2     Elberta Leatherwood, MD,MS,  PGY3 10/09/2016 5:13 PM

## 2016-10-09 NOTE — Patient Instructions (Signed)
Steps to Quit Smoking Smoking tobacco can be harmful to your health and can affect almost every organ in your body. Smoking puts you, and those around you, at risk for developing many serious chronic diseases. Quitting smoking is difficult, but it is one of the best things that you can do for your health. It is never too late to quit. What are the benefits of quitting smoking? When you quit smoking, you lower your risk of developing serious diseases and conditions, such as:  Lung cancer or lung disease, such as COPD.  Heart disease.  Stroke.  Heart attack.  Infertility.  Osteoporosis and bone fractures. Additionally, symptoms such as coughing, wheezing, and shortness of breath may get better when you quit. You may also find that you get sick less often because your body is stronger at fighting off colds and infections. If you are pregnant, quitting smoking can help to reduce your chances of having a baby of low birth weight. How do I get ready to quit? When you decide to quit smoking, create a plan to make sure that you are successful. Before you quit:  Pick a date to quit. Set a date within the next two weeks to give you time to prepare.  Write down the reasons why you are quitting. Keep this list in places where you will see it often, such as on your bathroom mirror or in your car or wallet.  Identify the people, places, things, and activities that make you want to smoke (triggers) and avoid them. Make sure to take these actions:  Throw away all cigarettes at home, at work, and in your car.  Throw away smoking accessories, such as Scientist, research (medical).  Clean your car and make sure to empty the ashtray.  Clean your home, including curtains and carpets.  Tell your family, friends, and coworkers that you are quitting. Support from your loved ones can make quitting easier.  Talk with your health care provider about your options for quitting smoking.  Find out what treatment  options are covered by your health insurance. What strategies can I use to quit smoking? Talk with your healthcare provider about different strategies to quit smoking. Some strategies include:  Quitting smoking altogether instead of gradually lessening how much you smoke over a period of time. Research shows that quitting "cold [place]" is more successful than gradually quitting.  Attending in-person counseling to help you build problem-solving skills. You are more likely to have success in quitting if you attend several counseling sessions. Even short sessions of 10 minutes can be effective.  Finding resources and support systems that can help you to quit smoking and remain smoke-free after you quit. These resources are most helpful when you use them often. They can include:  Online chats with a Social worker.  Telephone quitlines.  Printed Furniture conservator/restorer.  Support groups or group counseling.  Text messaging programs.  Mobile phone applications.  Taking medicines to help you quit smoking. (If you are pregnant or breastfeeding, talk with your health care provider first.) Some medicines contain nicotine and some do not. Both types of medicines help with cravings, but the medicines that include nicotine help to relieve withdrawal symptoms. Your health care provider may recommend:  Nicotine patches, gum, or lozenges.  Nicotine inhalers or sprays.  Non-nicotine medicine that is taken by mouth. Talk with your health care provider about combining strategies, such as taking medicines while you are also receiving in-person counseling. Using these two strategies together makes you more  likely to succeed in quitting than if you used either strategy on its own. If you are pregnant or breastfeeding, talk with your health care provider about finding counseling or other support strategies to quit smoking. Do not take medicine to help you quit smoking unless told to do so by your health care  provider. What things can I do to make it easier to quit? Quitting smoking might feel overwhelming at first, but there is a lot that you can do to make it easier. Take these important actions:  Reach out to your family and friends and ask that they support and encourage you during this time. Call telephone quitlines, reach out to support groups, or work with a counselor for support.  Ask people who smoke to avoid smoking around you.  Avoid places that trigger you to smoke, such as bars, parties, or smoke-break areas at work.  Spend time around people who do not smoke.  Lessen stress in your life, because stress can be a smoking trigger for some people. To lessen stress, try:  Exercising regularly.  Deep-breathing exercises.  Yoga.  Meditating.  Performing a body scan. This involves closing your eyes, scanning your body from head to toe, and noticing which parts of your body are particularly tense. Purposefully relax the muscles in those areas.  Download or purchase mobile phone or tablet apps (applications) that can help you stick to your quit plan by providing reminders, tips, and encouragement. There are many free apps, such as QuitGuide from the [employer] Office manager for Disease Control and Prevention). You can find other support for quitting smoking (smoking cessation) through smokefree.gov and other websites. How will I feel when I quit smoking? Within the first 24 hours of quitting smoking, you may start to feel some withdrawal symptoms. These symptoms are usually most noticeable 2-3 days after quitting, but they usually do not last beyond 2-3 weeks. Changes or symptoms that you might experience include:  Mood swings.  Restlessness, anxiety, or irritation.  Difficulty concentrating.  Dizziness.  Strong cravings for sugary foods in addition to nicotine.  Mild weight gain.  Constipation.  Nausea.  Coughing or a sore throat.  Changes in how your medicines work in your  body.  A depressed mood.  Difficulty sleeping (insomnia). After the first 2-3 weeks of quitting, you may start to notice more positive results, such as:  Improved sense of smell and taste.  Decreased coughing and sore throat.  Slower heart rate.  Lower blood pressure.  Clearer skin.  The ability to breathe more easily.  Fewer sick days. Quitting smoking is very challenging for most people. Do not get discouraged if you are not successful the first time. Some people need to make many attempts to quit before they achieve long-term success. Do your best to stick to your quit plan, and talk with your health care provider if you have any questions or concerns. This information is not intended to replace advice given to you by your health care provider. Make sure you discuss any questions you have with your health care provider. Document Released: 06/05/2001 Document Revised: 02/07/2016 Document Reviewed: 10/26/2014 Elsevier Interactive Patient Education  2017 [place].    Bupropion sustained-release tablets (smoking cessation) What is this medicine? BUPROPION (byoo PROE pee on) is used to help people quit smoking. This medicine may be used for other purposes; ask your health care provider or pharmacist if you have questions. COMMON BRAND NAME(S): Buproban, Zyban What should I tell my health care provider  before I take this medicine? They need to know if you have any of these conditions: -an eating disorder, such as anorexia or bulimia -bipolar disorder or psychosis -diabetes or high blood sugar, treated with medication -glaucoma -head injury or brain tumor -heart disease, previous heart attack, or irregular heart beat -high blood pressure -kidney or liver disease -seizures -suicidal thoughts or a previous suicide attempt -Tourette's syndrome -weight loss -an unusual or allergic reaction to bupropion, other medicines, foods, dyes, or  preservatives -breast-feeding -pregnant or trying to become pregnant How should I use this medicine? Take this medicine by mouth with a glass of water. Follow the directions on the prescription label. You can take it with or without food. If it upsets your stomach, take it with food. Do not cut, crush or chew this medicine. Take your medicine at regular intervals. If you take this medicine more than once a day, take your second dose at least 8 hours after you take your first dose. To limit difficulty in sleeping, avoid taking this medicine at bedtime. Do not take your medicine more often than directed. Do not stop taking this medicine suddenly except upon the advice of your doctor. Stopping this medicine too quickly may cause serious side effects. A special MedGuide will be given to you by the pharmacist with each prescription and refill. Be sure to read this information carefully each time. Talk to your pediatrician regarding the use of this medicine in children. Special care may be needed. Overdosage: If you think you have taken too much of this medicine contact a poison control center or emergency room at once. NOTE: This medicine is only for you. Do not share this medicine with others. What if I miss a dose? If you miss a dose, skip the missed dose and take your next tablet at the regular time. There should be at least 8 hours between doses. Do not take double or extra doses. What may interact with this medicine? Do not take this medicine with any of the following medications: -linezolid -MAOIs like Azilect, Carbex, Eldepryl, Marplan, Nardil, and Parnate -methylene blue (injected into a vein) -other medicines that contain bupropion like Wellbutrin This medicine may also interact with the following medications: -alcohol -certain medicines for anxiety or sleep -certain medicines for blood pressure like metoprolol, propranolol -certain medicines for depression or psychotic  disturbances -certain medicines for HIV or AIDS like efavirenz, lopinavir, nelfinavir, ritonavir -certain medicines for irregular heart beat like propafenone, flecainide -certain medicines for Parkinson's disease like amantadine, levodopa -certain medicines for seizures like carbamazepine, phenytoin, phenobarbital -cimetidine -clopidogrel -cyclophosphamide -digoxin -furazolidone -isoniazid -nicotine -orphenadrine -procarbazine -steroid medicines like prednisone or cortisone -stimulant medicines for attention disorders, weight loss, or to stay awake -tamoxifen -theophylline -thiotepa -ticlopidine -tramadol -warfarin This list may not describe all possible interactions. Give your health care provider a list of all the medicines, herbs, non-prescription drugs, or dietary supplements you use. Also tell them if you smoke, drink alcohol, or use illegal drugs. Some items may interact with your medicine. What should I watch for while using this medicine? Visit your doctor or health care professional for regular checks on your progress. This medicine should be used together with a patient support program. It is important to participate in a behavioral program, counseling, or other support program that is recommended by your health care professional. Patients and their families should watch out for new or worsening thoughts of suicide or depression. Also watch out for sudden changes in feelings such as feeling anxious,  agitated, panicky, irritable, hostile, aggressive, impulsive, severely restless, overly excited and hyperactive, or not being able to sleep. If this happens, especially at the beginning of treatment or after a change in dose, call your health care professional. Avoid alcoholic drinks while taking this medicine. Drinking excessive alcoholic beverages, using sleeping or anxiety medicines, or quickly stopping the use of these agents while taking this medicine may increase your risk for a  seizure. Do not drive or use heavy machinery until you know how this medicine affects you. This medicine can impair your ability to perform these tasks. Do not take this medicine close to bedtime. It may prevent you from sleeping. Your mouth may get dry. Chewing sugarless gum or sucking hard candy, and drinking plenty of water may help. Contact your doctor if the problem does not go away or is severe. Do not use nicotine patches or chewing gum without the advice of your doctor or health care professional while taking this medicine. You may need to have your blood pressure taken regularly if your doctor recommends that you use both nicotine and this medicine together. What side effects may I notice from receiving this medicine? Side effects that you should report to your doctor or health care professional as soon as possible: -allergic reactions like skin rash, itching or hives, swelling of the face, lips, or tongue -breathing problems -changes in vision -confusion -elevated mood, decreased need for sleep, racing thoughts, impulsive behavior -fast or irregular heartbeat -hallucinations, loss of contact with reality -increased blood pressure -redness, blistering, peeling or loosening of the skin, including inside the mouth -seizures -suicidal thoughts or other mood changes -unusually weak or tired -vomiting Side effects that usually do not require medical attention (report to your doctor or health care professional if they continue or are bothersome): -constipation -headache -loss of appetite -nausea -tremors -weight loss This list may not describe all possible side effects. Call your doctor for medical advice about side effects. You may report side effects to FDA at 1-800-FDA-1088. Where should I keep my medicine? Keep out of the reach of children. Store at room temperature between 20 and 25 degrees C (68 and 77 degrees F). Protect from light. Keep container tightly closed. Throw away  any unused medicine after the expiration date. NOTE: This sheet is a summary. It may not cover all possible information. If you have questions about this medicine, talk to your doctor, pharmacist, or health care provider.  2018 Elsevier/Gold Standard (2015-12-02 13:49:28)

## 2016-10-09 NOTE — Assessment & Plan Note (Signed)
Stable/improved: Patient states that she feels well on her current Zoloft dosing. No issues at this time. Denies SI/HI. - Continue current medication regimen.

## 2016-10-09 NOTE — Assessment & Plan Note (Signed)
Ready to quit: Patient states she is ready to quit smoking. She has a 38-pack-year history. She states that she would like to have quit by the end of May. - Strong encouragement/positive reinforcement - Smoking cessation call line business card provided. - Nicotine replacement lozenges when necessary - Wellbutrin 150 mg daily 3 days, then twice a day after that. (Patient advised to start this approximately 2 weeks prior to her quit date goal) - I have asked patient to set up a follow-up appointment 1-4 weeks after her quit date goal.

## 2016-10-15 ENCOUNTER — Telehealth: Payer: Self-pay | Admitting: Family Medicine

## 2016-10-15 NOTE — Telephone Encounter (Signed)
FYI to MD

## 2016-10-15 NOTE — Telephone Encounter (Signed)
Ask patient to schedule an app in the clinic when possible (soon).

## 2016-10-15 NOTE — Telephone Encounter (Signed)
NP with [employer];  At todays home vist, A1c was 8.1. She also has 2+glucose in her urine.  Pt says she is due for an appt in may but hasnt scheduled it.  Per pt all her dec labs were normal.

## 2016-10-16 NOTE — Telephone Encounter (Signed)
Patient has appointment scheduled with PCP for 5/1.

## 2016-10-18 IMAGING — MG MM SCREEN MAMMOGRAM BILATERAL
5 series · 5 of 5 positions shown · non-contrast
Comparison: Previous exam(s).

CLINICAL DATA: Screening.

EXAM:
DIGITAL SCREENING BILATERAL MAMMOGRAM WITH CAD

[R CC]
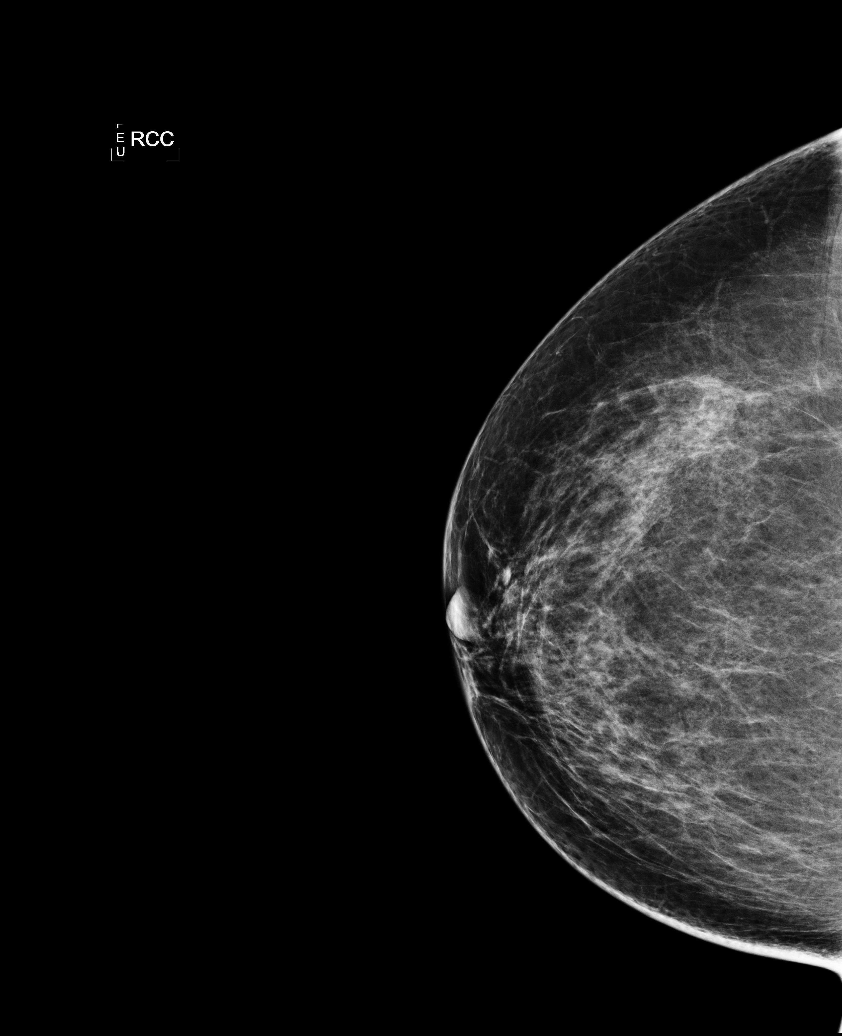

[L CC]
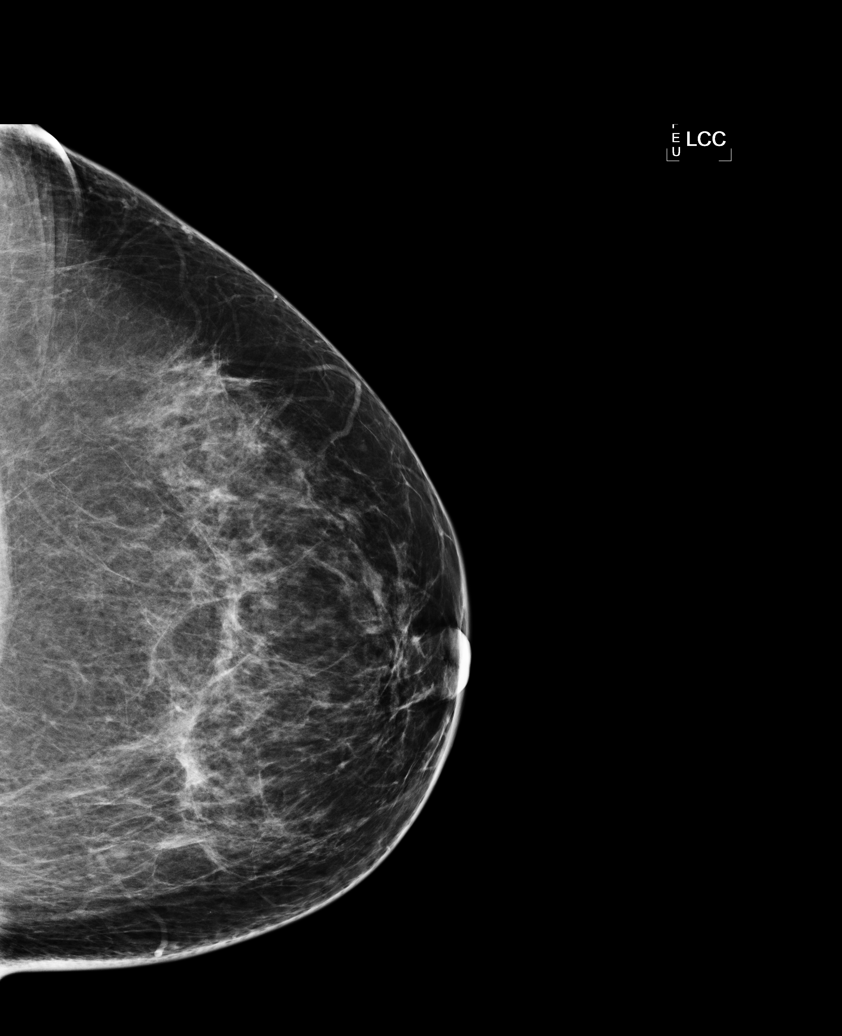

[L MLO (1 of 2)]
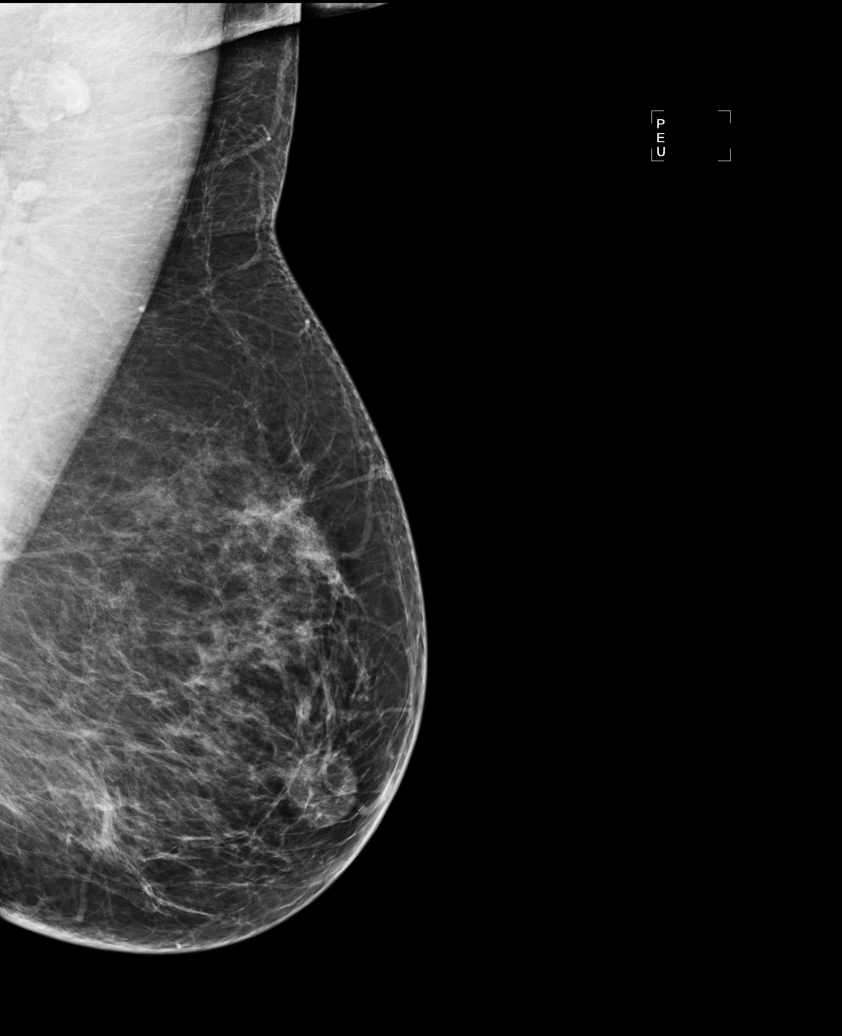

[R MLO]
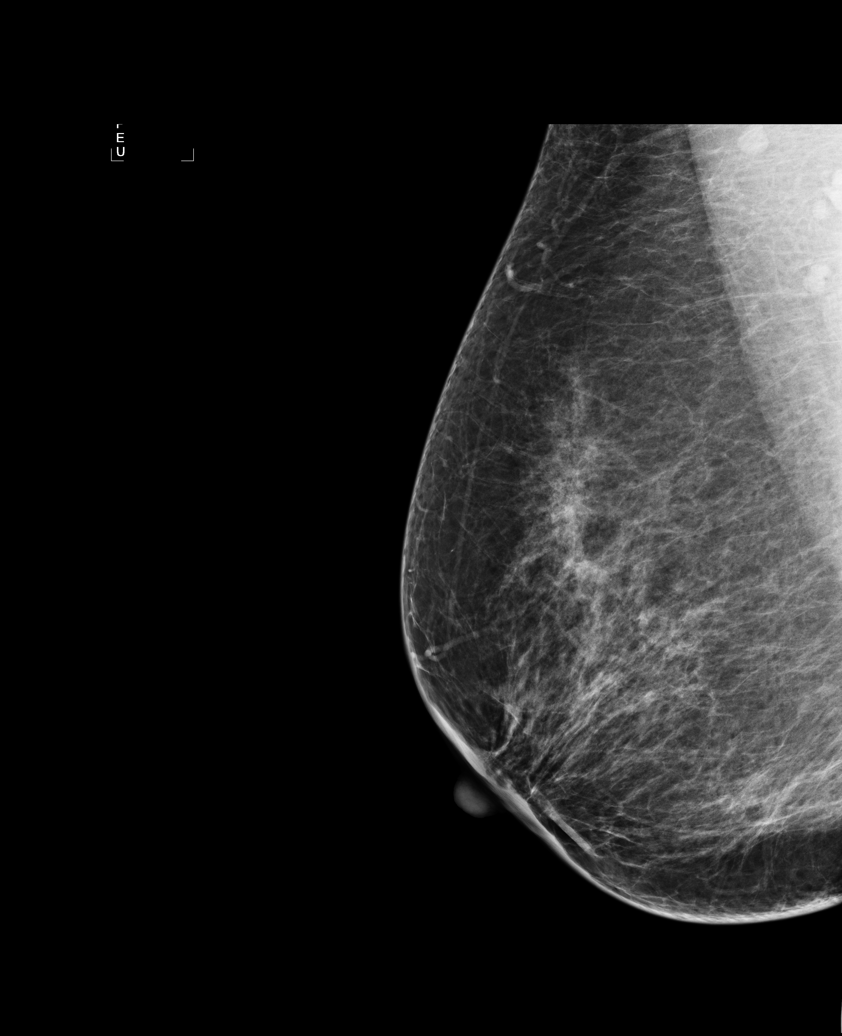

[L MLO (2 of 2)]
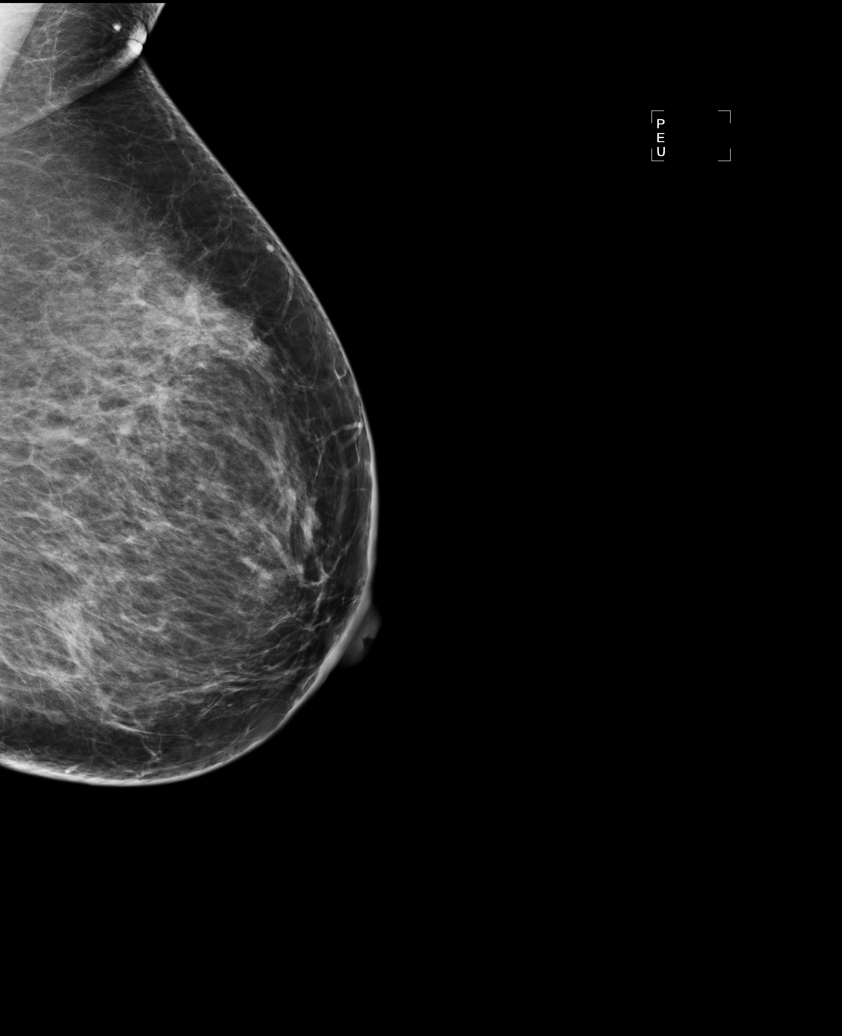

[5 of 5 positions shown; findings below may reference images not displayed]

ACR Breast Density Category b: There are scattered areas of
fibroglandular density.
FINDINGS: There are no findings suspicious for malignancy. Images were
processed with CAD.
IMPRESSION: No mammographic evidence of malignancy. A result letter of this
screening mammogram will be mailed directly to the patient.

RECOMMENDATION:
Screening mammogram in one year. (Code:AS-G-LCT)

BI-RADS CATEGORY  1: Negative.

## 2016-10-19 ENCOUNTER — Encounter: Payer: Self-pay | Admitting: Internal Medicine

## 2016-10-19 ENCOUNTER — Ambulatory Visit (INDEPENDENT_AMBULATORY_CARE_PROVIDER_SITE_OTHER): Payer: Medicare Other | Admitting: Internal Medicine

## 2016-10-19 DIAGNOSIS — E119 Type 2 diabetes mellitus without complications: Secondary | ICD-10-CM | POA: Insufficient documentation

## 2016-10-19 DIAGNOSIS — E118 Type 2 diabetes mellitus with unspecified complications: Secondary | ICD-10-CM

## 2016-10-19 LAB — POCT GLYCOSYLATED HEMOGLOBIN (HGB A1C): Hemoglobin A1C: 8.9

## 2016-10-19 MED ORDER — ONETOUCH DELICA LANCING DEV MISC: 1.0000 | 0 refills | Status: DC

## 2016-10-19 MED ORDER — ONETOUCH DELICA LANCETS 33G MISC: 30.0000 [IU] | 0 refills | Status: DC

## 2016-10-19 MED ORDER — ONETOUCH VERIO FLEX SYSTEM W/DEVICE KIT: 1.0000 | PACK | 0 refills | Status: DC

## 2016-10-19 MED ORDER — GLUCOSE BLOOD VI STRP: ORAL_STRIP | 12 refills | Status: DC

## 2016-10-19 MED ORDER — METFORMIN HCL 500 MG PO TABS: ORAL_TABLET | ORAL | 0 refills | Status: DC

## 2016-10-19 NOTE — Progress Notes (Signed)
    [facility] Asiyah Mikell, MD Phone: [phone]  Reason For Visit: SDA for High blood sugars   Patient was noted to have an A1c of 8.9 when she went for a woman's healthy screening. She is worried about her blood sugars being elevated. She previously was noted to be prediabetic. She indicates having increased frequency of urination lately. She denies any increased thirst or hunger. She does not have a blood sugar meter to calculate her CBGs and would like one.   Past Medical History Reviewed problem list.  Medications- reviewed and updated No additions to family history Social history- patient is a non- smoker  Objective: BP 102/68   Pulse 90   Temp 98.3 F (36.8 C) (Oral)   Wt 213 lb (96.6 kg)   BMI 32.39 kg/m  Gen: NAD, alert, cooperative with exam Cardio: regular rate and rhythm, S1S2 heard, no murmurs appreciated Pulm: clear to auscultation bilaterally, no wheezes, rhonchi or rales Skin: dry, intact, no rashes or lesions   Assessment/Plan: See problem based a/p  Diabetes (HCC) Diagnosis of diabetes, A1c 8.9.  - Provided reassurance and counseling regarding exercise and weight loss - Discussed some nutrition interventions patient to consider - Blood Glucose Monitoring Suppl (ONETOUCH VERIO FLEX SYSTEM) w/Device KIT; 1 applicator by Does not apply route every morning.  Dispense: 1 kit; Refill: 0 - glucose blood (ONETOUCH VERIO) test strip; Use as instructed  Dispense: 100 each; Refill: 12 - ONETOUCH DELICA LANCETS 33G MISC; 30 Units by Does not apply route every morning.  Dispense: 100 each; Refill: 0 - Lancet Devices (ONE TOUCH DELICA LANCING DEV) MISC; 1 applicator by Does not apply route every morning.  Dispense: 1 each; Refill: 0 - metFORMIN (GLUCOPHAGE) 500 MG tablet; Take 1 tablet for 1 week and then 1 tablet in the AM and PM for week two, then take 2 tablets in the AM and 1 tablet in the PM  Dispense: 90 tablet; Refill: 0 - POCT  glycosylated hemoglobin (Hb A1C) - Basic metabolic panel - Follow up in 2 weeks with PCP     

## 2016-10-19 NOTE — Patient Instructions (Addendum)
Please start metformin and follow directions as discussed today. Follow-up with your PCP in 2-3 weeks. These get plenty of fluids and start implement the food plans as we've discussed

## 2016-10-20 LAB — BASIC METABOLIC PANEL
BUN/Creatinine Ratio: 12 (ref 12–28)
BUN: 12 mg/dL (ref 8–27)
CALCIUM: 10.4 mg/dL — AB (ref 8.7–10.3)
CHLORIDE: 104 mmol/L (ref 96–106)
CO2: 20 mmol/L (ref 18–29)
Creatinine, Ser: 1.02 mg/dL — ABNORMAL HIGH (ref 0.57–1.00)
GFR calc Af Amer: 68 mL/min/{1.73_m2} (ref >59)
GFR calc non Af Amer: 59 mL/min/{1.73_m2} — ABNORMAL LOW (ref >59)
GLUCOSE: 165 mg/dL — AB (ref 65–99)
Potassium: 4.9 mmol/L (ref 3.5–5.2)
Sodium: 146 mmol/L — ABNORMAL HIGH (ref 134–144)

## 2016-10-23 ENCOUNTER — Ambulatory Visit: Payer: Medicare Other | Admitting: Family Medicine

## 2016-10-23 NOTE — Assessment & Plan Note (Signed)
Diagnosis of diabetes, A1c 8.9.  - Provided reassurance and counseling regarding exercise and weight loss - Discussed some nutrition interventions patient to consider - Blood Glucose Monitoring Suppl (ONETOUCH VERIO FLEX SYSTEM) w/Device KIT; 1 applicator by Does not apply route every morning.  Dispense: 1 kit; Refill: 0 - glucose blood (ONETOUCH VERIO) test strip; Use as instructed  Dispense: 100 each; Refill: 12 - ONETOUCH DELICA LANCETS 00K MISC; 30 Units by Does not apply route every morning.  Dispense: 100 each; Refill: 0 - Lancet Devices (ONE TOUCH DELICA LANCING DEV) MISC; 1 applicator by Does not apply route every morning.  Dispense: 1 each; Refill: 0 - metFORMIN (GLUCOPHAGE) 500 MG tablet; Take 1 tablet for 1 week and then 1 tablet in the AM and PM for week two, then take 2 tablets in the AM and 1 tablet in the PM  Dispense: 90 tablet; Refill: 0 - POCT glycosylated hemoglobin (Hb H9X) - Basic metabolic panel - Follow up in 2 weeks with PCP

## 2016-10-24 ENCOUNTER — Other Ambulatory Visit: Payer: Self-pay | Admitting: Family Medicine

## 2016-10-25 ENCOUNTER — Encounter: Payer: Self-pay | Admitting: Internal Medicine

## 2016-11-01 ENCOUNTER — Ambulatory Visit (INDEPENDENT_AMBULATORY_CARE_PROVIDER_SITE_OTHER): Payer: Medicare Other

## 2016-11-01 ENCOUNTER — Ambulatory Visit (INDEPENDENT_AMBULATORY_CARE_PROVIDER_SITE_OTHER): Payer: Medicare Other | Admitting: Podiatry

## 2016-11-01 ENCOUNTER — Encounter: Payer: Self-pay | Admitting: Podiatry

## 2016-11-01 DIAGNOSIS — L84 Corns and callosities: Secondary | ICD-10-CM

## 2016-11-01 DIAGNOSIS — E1140 Type 2 diabetes mellitus with diabetic neuropathy, unspecified: Secondary | ICD-10-CM

## 2016-11-01 DIAGNOSIS — Q828 Other specified congenital malformations of skin: Secondary | ICD-10-CM

## 2016-11-01 DIAGNOSIS — E1149 Type 2 diabetes mellitus with other diabetic neurological complication: Secondary | ICD-10-CM

## 2016-11-01 NOTE — Progress Notes (Signed)
Subjective:    Patient ID: Jenna Wong, female   DOB: 66 y.o.   MRN: [id number]   HPI patient presents stating I have very painful fifth toes of both feet and I tried wider shoes I tried to trim them and pad them without relief and I do of diabetes under excellent control    Review of Systems  All other systems reviewed and are negative.       Objective:  Physical Exam  Constitutional: She is oriented to person, place, and time.  Cardiovascular: Intact distal pulses.   Musculoskeletal: Normal range of motion.  Neurological: She is alert and oriented to person, place, and time.  Skin: Pallor: neurovascular status intact muscle strength was adequate range of motion was within normal limits with patient found to have very tender.  Nursing note and vitals reviewed.  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 Using sharp sterile his mentation debridement of lesions accomplished with no iatrogenic bleeding and padding applied and we'll see back in 1 month for consideration long-term surgical intervention    X-ray indicates there is spurring of the fifth digit bilateral proximal phalanx head

## 2016-11-26 ENCOUNTER — Other Ambulatory Visit: Payer: Self-pay | Admitting: *Deleted

## 2016-11-26 DIAGNOSIS — E118 Type 2 diabetes mellitus with unspecified complications: Secondary | ICD-10-CM

## 2016-11-26 MED ORDER — METFORMIN HCL 500 MG PO TABS: ORAL_TABLET | ORAL | 3 refills | Status: DC

## 2016-12-03 ENCOUNTER — Ambulatory Visit (INDEPENDENT_AMBULATORY_CARE_PROVIDER_SITE_OTHER): Payer: Medicare Other | Admitting: Podiatry

## 2016-12-03 ENCOUNTER — Encounter: Payer: Self-pay | Admitting: Podiatry

## 2016-12-03 DIAGNOSIS — M2041 Other hammer toe(s) (acquired), right foot: Secondary | ICD-10-CM | POA: Diagnosis not present

## 2016-12-03 DIAGNOSIS — M2042 Other hammer toe(s) (acquired), left foot: Secondary | ICD-10-CM | POA: Diagnosis not present

## 2016-12-03 NOTE — Progress Notes (Signed)
Subjective:    Patient ID: Jenna Wong, female   DOB: 66 y.o.   MRN: [id number]   HPI patient states these toes are still really very sore and making it hard for me to be active and I cannot wear shoe gear without pain and I need to have them fixed    ROS      Objective:  Physical Exam neurovascular status found to be intact muscle strength adequate range of motion within normal limits with patient found to have severe keratotic lesions fifth digit both feet that are very painful with inability to wear shoe gear and has tried wider shoes and other modalities without relief     Assessment:    Chronic hammertoe deformity fifth digit bilateral with pain     Plan:    H&P conditions reviewed and discussed treatment options. At this point due to the amount of pain she's and failure to respond to conservative care consisting of aggressive trimming it's been recommended that surgery be undertaken and arthroplasty fifth digits bilateral. Patient wants surgery understanding procedures and risk and at this time after extensive review signs consent form and is scheduled for outpatient surgery. She understands the lesions may not resolve and that there may be some loss of continuity of the digits. She stands total recovery can take upwards of 6 months

## 2016-12-03 NOTE — Patient Instructions (Signed)
Pre-Operative Instructions  Congratulations, you have decided to take an important step to improving your quality of life.  You can be assured that the doctors of [facility] will be with you every step of the way.  1. Plan to be at the surgery center/hospital at least 1 (one) hour prior to your scheduled time unless otherwise directed by the surgical center/hospital staff.  You must have a responsible adult accompany you, remain during the surgery and drive you home.  Make sure you have directions to the surgical center/hospital and know how to get there on time. 2. For hospital based surgery you will need to obtain a history and physical form from your family physician within 1 month prior to the date of surgery- we will give you a form for you primary physician.  3. We make every effort to accommodate the date you request for surgery.  There are however, times where surgery dates or times have to be moved.  We will contact you as soon as possible if a change in schedule is required.   4. No Aspirin/Ibuprofen for one week before surgery.  If you are on aspirin, any non-steroidal anti-inflammatory medications (Mobic, Aleve, Ibuprofen) you should stop taking it 7 days prior to your surgery.  You make take Tylenol  For pain prior to surgery.  5. Medications- If you are taking daily heart and blood pressure medications, seizure, reflux, allergy, asthma, anxiety, pain or diabetes medications, make sure the surgery center/hospital is aware before the day of surgery so they may notify you which medications to take or avoid the day of surgery. 6. No food or drink after midnight the night before surgery unless directed otherwise by surgical center/hospital staff. 7. No alcoholic beverages 24 hours prior to surgery.  No smoking 24 hours prior to or 24 hours after surgery. 8. Wear loose pants or shorts- loose enough to fit over bandages, boots, and casts. 9. No slip on shoes, sneakers are best. 10. Bring  your boot with you to the surgery center/hospital.  Also bring crutches or a walker if your physician has prescribed it for you.  If you do not have this equipment, it will be provided for you after surgery. 11. If you have not been contracted by the surgery center/hospital by the day before your surgery, call to confirm the date and time of your surgery. 12. Leave-time from work may vary depending on the type of surgery you have.  Appropriate arrangements should be made prior to surgery with your employer. 13. Prescriptions will be provided immediately following surgery by your doctor.  Have these filled as soon as possible after surgery and take the medication as directed. 14. Remove nail polish on the operative foot. 15. Wash the night before surgery.  The night before surgery wash the foot and leg well with the antibacterial soap provided and water paying special attention to beneath the toenails and in between the toes.  Rinse thoroughly with water and dry well with a towel.  Perform this wash unless told not to do so by your physician.  Enclosed: 1 Ice pack (please put in freezer the night before surgery)   1 Hibiclens skin cleaner   Pre-op Instructions  If you have any questions regarding the instructions, do not hesitate to call our office.  [place]: [address] [place], [place] [address] [phone]  [place]: [address] [place], [place] [address] [phone]  [place]: [address]  [place], [place] [address] [phone]   Dr.   Shantai Tiedeman DPM, Dr. Matthew Wagoner DPM, Dr. M. Todd Hyatt DPM, Dr. Titorya Stover DPM 

## 2016-12-13 ENCOUNTER — Encounter: Payer: Self-pay | Admitting: Family Medicine

## 2016-12-17 ENCOUNTER — Telehealth: Payer: Self-pay | Admitting: *Deleted

## 2016-12-17 NOTE — Telephone Encounter (Signed)
"  I'm going to have to cancel my surgery scheduled for tomorrow.  They left me a message on Friday saying I needed to pay $250.00 plus whatever else they said.  I don't have that kind of money.  I live on a fixed income."  I will get it canceled.  Is that something you would be interested in doing here at our office?  "I was hoping that I could have it done there just by giving me a needle for the pain."  I'll get Jocelyn Lamer to give you a call back with a quote.  "I would appreciate it."

## 2016-12-24 ENCOUNTER — Telehealth: Payer: Self-pay | Admitting: *Deleted

## 2016-12-24 ENCOUNTER — Ambulatory Visit: Payer: Medicare Other

## 2016-12-24 NOTE — Telephone Encounter (Signed)
"  I'm calling to check on scheduling an appointment.  I was supposed to have it done at some place and they wanted too much.  Somebody called me back and said we would schedule it there at your office and said you would call me back.  Please give me a call.

## 2016-12-25 NOTE — Telephone Encounter (Signed)
"  Did you call me?"  Yes, I was returning your call.  You wanted to schedule an office surgery?  "Yes, I do."  Do you have a date in mind?  "Whatever you have available."  He can do it on July 25.  "That date will be fine.  Will I need someone to come with me?  "Yes, you will need someone to come with you.  We are doing both little toes correct?  "Yes, that's right."  We will need you to be here at 7:45 am.

## 2016-12-28 ENCOUNTER — Encounter: Payer: Self-pay | Admitting: Family Medicine

## 2016-12-31 NOTE — Telephone Encounter (Signed)
I"m getting back from vacation late on the 24th. Schedule her the folowing week for her surgery. Start patients at 8:45 on that day

## 2017-01-02 ENCOUNTER — Encounter: Payer: Medicare Other | Admitting: Podiatry

## 2017-01-03 ENCOUNTER — Telehealth: Payer: Self-pay | Admitting: *Deleted

## 2017-01-03 NOTE — Telephone Encounter (Signed)
I am calling to let you know that we are going to have to reschedule your office surgery.  Dr. Paulla Dolly will not be in the office on July 25.  Can you do it on August 1 or 8?  "Yes, put me down for August 1."  Okay, thank you so much.  (Office Surgery 7:45 am arrival - using the [facility]ryker)

## 2017-01-16 ENCOUNTER — Ambulatory Visit: Payer: Medicare Other | Admitting: Podiatry

## 2017-01-18 ENCOUNTER — Telehealth: Payer: Self-pay | Admitting: *Deleted

## 2017-01-18 NOTE — Telephone Encounter (Signed)
I'm calling in regards to your surgery scheduled for August 1.  Can you come in at 11:15 am instead of 7:45 am?  "Yes, that will be fine."  Thank you so much.

## 2017-01-23 ENCOUNTER — Encounter: Payer: Self-pay | Admitting: Podiatry

## 2017-01-23 ENCOUNTER — Telehealth: Payer: Self-pay | Admitting: *Deleted

## 2017-01-23 ENCOUNTER — Ambulatory Visit (INDEPENDENT_AMBULATORY_CARE_PROVIDER_SITE_OTHER): Payer: Medicare Other | Admitting: Podiatry

## 2017-01-23 ENCOUNTER — Ambulatory Visit: Payer: Medicare Other | Admitting: Podiatry

## 2017-01-23 VITALS — BP 100/70 | HR 86 | Temp 98.4°F | Resp 16

## 2017-01-23 DIAGNOSIS — M2042 Other hammer toe(s) (acquired), left foot: Secondary | ICD-10-CM

## 2017-01-23 DIAGNOSIS — M2041 Other hammer toe(s) (acquired), right foot: Secondary | ICD-10-CM

## 2017-01-23 MED ORDER — HYDROCODONE-ACETAMINOPHEN 10-325 MG PO TABS: ORAL_TABLET | ORAL | 0 refills | Status: DC

## 2017-01-23 NOTE — Telephone Encounter (Signed)
I informed pt Dr. Paulla Dolly would need her in office at 10:45am today. Pt states understanding.

## 2017-01-23 NOTE — Telephone Encounter (Signed)
Pt presents for B/L 5th hammer toe repairs in office surgery suite. Dr. Paulla Dolly ordered Hydrocodone 10/325mg  #25 one tablet every 4-6 hours prn foot pain.

## 2017-01-23 NOTE — Progress Notes (Signed)
Subjective:    Patient ID: Jenna Wong, female   DOB: 66 y.o.   MRN: [id number]   HPI patient presents for correction of painful hammertoe deformity fifth digit both feet stating that she's tried wider shoes padding trimming without relief    ROS      Objective:  Physical Exam neurovascular status found to be intact with patient found to have large keratotic lesion digit 5 bilateral proximal and middle phalanx     Assessment:    Chronic lesion with hammertoe deformity fifth digit bilateral     Plan:    Patient was brought to the OR and was infiltrated with 60 Milligan times like Marcaine mixture and each toe. Sterile prep was applied bilateral sterile dressings were applied to the area and then we went ahead for the left one and applied a Ace wrap and inflated tourniquet to 250 mmHg. The fifth digit was exposed and following procedure was performed. Attention directed dorsal aspect digit 5 left were a semielliptical incision was made centered over the chronic painful keratotic lesion. The incision was deepened through subcutaneous tissues tissue in the intervening skin wedge was removed in toto. A transverse incision into the extensor expansion was made the level of the interphalangeal joint and the medial and lateral collateral ligaments the interphalangeal joint were severed. The head of the proximal phalanx was removed and resected entirely and then because it extended into the middle and distal phalanx I took the lateral one third of the metatarsal and distal phalanx left to reduce the pressure. I then derotated the digit flushed it copiously with sterile Garamycin solution and sutured with 5-0 nylon. Sterile dressing applied and tourniquet was released with capillary fill noted to be immediate to all digits on the left foot. Procedure #2 arthroplasty digit 5 right attention was directed to the right foot where the identical procedure to the left foot was performed. Patient left the OR in  satisfactory condition

## 2017-02-07 ENCOUNTER — Ambulatory Visit (INDEPENDENT_AMBULATORY_CARE_PROVIDER_SITE_OTHER): Payer: Medicare Other | Admitting: Podiatry

## 2017-02-07 ENCOUNTER — Ambulatory Visit (INDEPENDENT_AMBULATORY_CARE_PROVIDER_SITE_OTHER): Payer: Medicare Other

## 2017-02-07 ENCOUNTER — Encounter: Payer: Self-pay | Admitting: Podiatry

## 2017-02-07 VITALS — Temp 98.1°F

## 2017-02-07 DIAGNOSIS — M2041 Other hammer toe(s) (acquired), right foot: Secondary | ICD-10-CM

## 2017-02-07 DIAGNOSIS — M79671 Pain in right foot: Secondary | ICD-10-CM

## 2017-02-07 DIAGNOSIS — M2042 Other hammer toe(s) (acquired), left foot: Secondary | ICD-10-CM

## 2017-02-07 DIAGNOSIS — M79672 Pain in left foot: Secondary | ICD-10-CM

## 2017-02-07 NOTE — Progress Notes (Signed)
Subjective:    Patient ID: Jenna Wong, female   DOB: 66 y.o.   MRN: [id number]   HPI patient states doing very well with her toes with minimal discomfort    ROS      Objective:  Physical Exam neurovascular status intact negative Homans sign was noted with digits and good alignment fifth with wound edges well coapted and stitches in place     Assessment:    Doing well post arthroplasty fifth digit bilateral     Plan:    H&P x-rays reviewed condition discussed stitches removed and sterile dressings reapplied. Patient may gradually increase activity but continue open toed shoes and will be seen back to recheck and is encouraged to call with any issues or questions  X-rays indicate satisfactory section of bone had a proximal phalanx bilateral

## 2017-02-08 ENCOUNTER — Ambulatory Visit: Payer: Medicare Other

## 2017-02-08 ENCOUNTER — Other Ambulatory Visit: Payer: Self-pay | Admitting: Family Medicine

## 2017-02-08 DIAGNOSIS — E785 Hyperlipidemia, unspecified: Secondary | ICD-10-CM

## 2017-03-04 ENCOUNTER — Other Ambulatory Visit: Payer: Self-pay | Admitting: *Deleted

## 2017-03-04 DIAGNOSIS — E118 Type 2 diabetes mellitus with unspecified complications: Secondary | ICD-10-CM

## 2017-03-04 MED ORDER — SERTRALINE HCL 50 MG PO TABS: ORAL_TABLET | ORAL | 5 refills | Status: DC

## 2017-03-04 MED ORDER — GLUCOSE BLOOD VI STRP: ORAL_STRIP | 12 refills | Status: DC

## 2017-03-04 MED ORDER — METFORMIN HCL 500 MG PO TABS: ORAL_TABLET | ORAL | 3 refills | Status: DC

## 2017-03-04 MED ORDER — ONETOUCH DELICA LANCETS 33G MISC: 30.0000 [IU] | 0 refills | Status: DC

## 2017-03-13 ENCOUNTER — Other Ambulatory Visit: Payer: Self-pay | Admitting: Family Medicine

## 2017-03-13 DIAGNOSIS — E785 Hyperlipidemia, unspecified: Secondary | ICD-10-CM

## 2017-03-14 ENCOUNTER — Other Ambulatory Visit: Payer: Self-pay | Admitting: *Deleted

## 2017-03-14 MED ORDER — ATORVASTATIN CALCIUM 40 MG PO TABS: 40.0000 mg | ORAL_TABLET | Freq: Every day | ORAL | 3 refills | Status: DC

## 2017-05-03 ENCOUNTER — Ambulatory Visit (INDEPENDENT_AMBULATORY_CARE_PROVIDER_SITE_OTHER): Payer: Medicare Other | Admitting: Student

## 2017-05-03 ENCOUNTER — Other Ambulatory Visit: Payer: Self-pay

## 2017-05-03 ENCOUNTER — Encounter: Payer: Self-pay | Admitting: Student

## 2017-05-03 VITALS — BP 112/68 | HR 90 | Temp 98.5°F | Ht 68.0 in | Wt 207.6 lb

## 2017-05-03 DIAGNOSIS — M25511 Pain in right shoulder: Secondary | ICD-10-CM | POA: Diagnosis not present

## 2017-05-03 DIAGNOSIS — Z23 Encounter for immunization: Secondary | ICD-10-CM | POA: Diagnosis not present

## 2017-05-03 DIAGNOSIS — R0789 Other chest pain: Secondary | ICD-10-CM

## 2017-05-03 MED ORDER — MELOXICAM 15 MG PO TABS: 15.0000 mg | ORAL_TABLET | Freq: Every day | ORAL | 0 refills | Status: DC

## 2017-05-03 MED ORDER — KETOROLAC TROMETHAMINE 30 MG/ML IJ SOLN
30.0000 mg | Freq: Once | INTRAMUSCULAR | Status: AC
  Administered 2017-05-03: 30 mg via INTRAMUSCULAR

## 2017-05-03 NOTE — Progress Notes (Signed)
Subjective:    Jenna Wong is a 66 y.o. old female here for right and left rib pain and right shoulder pain  HPI Bilateral rib pain: Reports soreness under her breasts bilaterally.  This has been going on for 1 week.  Denies history of trauma or fall.  Denies history of similar pain in the past.  She had common cold about a week ago.  She has been coughing since that.  Cough is productive with whitish to yellowish sputum.  She denies hemoptysis.  The ribs hurt with deep breathing and any sort of movement.  She denies fever or shortness of breath.  She denies a skin rash.  She denies history of blood clots, recent long car ride or flight, leg swelling or pain.  She is not on hormonal therapy.  She smokes about a pack a day.  She tried quitting smoking in the past but restarted again after she lost 2 of his family members about 3 months ago.  She is not interested in talking about smoking today.  Right shoulder pain: This is a chronic issue.  She has history of osteoarthritis.  She says she was was seen by orthopedic surgery in the past.  Pain radiates from her neck to her wrist anteriorly.  She denies numbness, tingling or weakness in right arm.   PMH/Problem List: has Hyperlipidemia; OBESITY, NOS; Depression with anxiety; INSOMNIA, PERSISTENT; SPINAL STENOSIS, LUMBAR; BACK PAIN, LOW; Personal history of tobacco use; Irritable bowel syndrome (IBS); Pre-diabetes; Essential hypertension; and Diabetes ([place]) on their problem list.   has a past medical history of Abnormal mammogram (12/00), Abscess breast (7/80), Anxiety, Breast abscess of female (07/18/2011), Cervical disc displacement, Depression, Dizziness, Mental and behavioral problems with learning, Spinal stenosis, and SPINAL STENOSIS, LUMBAR (06/22/2008).  FH:  Family History  Problem Relation Age of Onset  . Parkinsonism Brother   . Dementia Brother   . Hypertension Mother   . Hypertension Father   . Depression Sister   . Diabetes Sister   .  Stroke Sister     [facility] Social History   Tobacco Use  . Smoking status: Current Every Day Smoker    Packs/day: 1.00    Years: 30.00    Pack years: 30.00    Types: Cigarettes  . Smokeless tobacco: Never Used  . Tobacco comment: thinking about it  Substance Use Topics  . Alcohol use: Yes    Comment: 1 x per month  . Drug use: No    Review of Systems Review of systems negative except for pertinent positives and negatives in history of present illness above.     Objective:     Vitals:   05/03/17 1137  BP: 112/68  Pulse: 90  Temp: 98.5 F (36.9 C)  TempSrc: Oral  SpO2: 97%  Weight: 207 lb 9.6 oz (94.2 kg)  Height: 5\' 8"  (1.727 m)   Body mass index is 31.57 kg/m.  Physical Exam GENERAL: appears well, no ditress EYES: PERRL, EOMI,  THROAT: MMM NECK: Negative Spurling signs LUNGS:  No IWOB, CTAB HEART:  RRR, normal S1&S2, no murmurs ABD: soft, NT with active BS MSK: tenderness to palpation over the chest anteriorly below her breasts bilaterally.  Active range of motion limited by pain in his right shoulder.  Passive range of motion full bilaterally.  No focal tenderness to palpation.  No apparent swelling overlying skin change.  Motor 5/5 in all dermatomes of upper extremities.  Light sensation is intact in all  dermatomes of upper extremity.  Biceps reflex 2+ bilaterally. NEURO: As above PSYCH: normal affect    Assessment and Plan:  1. Anterior chest wall pain: Likely due to cough in the setting of viral URI.  Pain is reproducible by palpation.  Gave her Toradol injection 30 mg in clinic.  Given a prescription for meloxicam 15 mg daily.  Recommended trying a tablespoonful of honey before bedtime for cough.  Also suggested good hydration with water or Gatorade.  Advised to quit smoking and gave a great number.  She is still in the precontemplation stage.   2. Pain in joint of right shoulder: Chronic issue.  Likely osteoarthritis.  No acute changes.  Give Toradol injection  in clinic, and prescription for meloxicam 50 mg daily  3. Need for immunization against influenza - Flu Vaccine QUAD 36+ mos IM  Return if symptoms worsen or fail to improve.  Mercy Riding, MD 05/03/17 Pager: [phone]

## 2017-05-03 NOTE — Patient Instructions (Signed)
It was great seeing you today! We have addressed the following issues today 1. Rib pain: this is likely from your cough. We gave you an injection today. We also sent a prescription for Meloxicam to your pharmacy. Please avoid taking this medication with other over-the-counter pain medicine except Tylenol. We also recommend a tablespoon follow funny before bedtime. Keep yourself hydrated. We suggest to quit smoking. Call 1800-QUIT-NOW for help with stopping smoking.  If we did any lab work today, and the results require attention, either me or my nurse will get in touch with you. If everything is normal, you will get a letter in mail and a message via . If you don't hear from [place] in two weeks, please give [place] a call. Otherwise, we look forward to seeing you again at your next visit. If you have any questions or concerns before then, please call the clinic at [phone].  Please bring all your medications to every doctors visit  Sign up for My Chart to have easy access to your labs results, and communication with your Primary care physician.    Please check-out at the front desk before leaving the clinic.    Take Care,   Dr. Cyndia Skeeters

## 2017-05-06 ENCOUNTER — Telehealth: Payer: Self-pay | Admitting: Family Medicine

## 2017-05-06 NOTE — Telephone Encounter (Signed)
[employer] disability parking placard  form dropped off for at front desk for completion.  Verified that patient section of form has been completed.  Last DOS 05/03/17 /AWV  was 09/06/16.  Placed form in Red  team folder to be completed by clinical staff.  Carmina Miller

## 2017-05-08 NOTE — Telephone Encounter (Signed)
Clinical info completed on handicap form.  Place form in Dr. Arlana Pouch box for completion.  Jenna Wong, [place]

## 2017-05-20 ENCOUNTER — Encounter (HOSPITAL_COMMUNITY): Payer: Self-pay | Admitting: Emergency Medicine

## 2017-05-20 ENCOUNTER — Emergency Department (HOSPITAL_COMMUNITY)
Admission: EM | Admit: 2017-05-20 | Discharge: 2017-05-20 | Disposition: A | Discharge status: Home / Self Care | Payer: Medicare Other | Attending: Emergency Medicine | Admitting: Emergency Medicine

## 2017-05-20 ENCOUNTER — Emergency Department (HOSPITAL_COMMUNITY): Payer: Medicare Other

## 2017-05-20 DIAGNOSIS — Z7982 Long term (current) use of aspirin: Secondary | ICD-10-CM | POA: Insufficient documentation

## 2017-05-20 DIAGNOSIS — F17210 Nicotine dependence, cigarettes, uncomplicated: Secondary | ICD-10-CM | POA: Insufficient documentation

## 2017-05-20 DIAGNOSIS — Z7984 Long term (current) use of oral hypoglycemic drugs: Secondary | ICD-10-CM | POA: Diagnosis not present

## 2017-05-20 DIAGNOSIS — R05 Cough: Secondary | ICD-10-CM | POA: Diagnosis present

## 2017-05-20 DIAGNOSIS — E119 Type 2 diabetes mellitus without complications: Secondary | ICD-10-CM | POA: Insufficient documentation

## 2017-05-20 DIAGNOSIS — B349 Viral infection, unspecified: Secondary | ICD-10-CM | POA: Diagnosis not present

## 2017-05-20 DIAGNOSIS — Z79899 Other long term (current) drug therapy: Secondary | ICD-10-CM | POA: Insufficient documentation

## 2017-05-20 DIAGNOSIS — J209 Acute bronchitis, unspecified: Secondary | ICD-10-CM | POA: Insufficient documentation

## 2017-05-20 DIAGNOSIS — J208 Acute bronchitis due to other specified organisms: Secondary | ICD-10-CM

## 2017-05-20 DIAGNOSIS — I10 Essential (primary) hypertension: Secondary | ICD-10-CM | POA: Insufficient documentation

## 2017-05-20 MED ORDER — HYDROCOD POLST-CPM POLST ER 10-8 MG/5ML PO SUER: 5.0000 mL | Freq: Every evening | ORAL | 0 refills | Status: DC | PRN

## 2017-05-20 MED ORDER — BENZONATATE 100 MG PO CAPS: 100.0000 mg | ORAL_CAPSULE | Freq: Three times a day (TID) | ORAL | 0 refills | Status: DC

## 2017-05-20 NOTE — ED Triage Notes (Addendum)
Patient c/o productive cough 3 days with nasal congestion and increased pain in back and bilateral ribs while coughing with body aches and fevers. Denies N/V/D. Afebrile in triage. Ambulatory. Speaking in full sentences without difficulty. Denies chest pain and SOB.

## 2017-05-20 NOTE — ED Provider Notes (Signed)
[facility] DEPT Provider Note   CSN: [id number] Arrival date & time: 05/20/17  1224     History   Chief Complaint Chief Complaint  Patient presents with  . Cough    HPI Jenna Wong is a 66 y.o. female who presents to the ED with a cough. The cough started about 3 weeks ago. Patient reports that she had fever and chills when the symptoms first started but not now. Today she is c/o bilateral rib pain due to the cough. Patient is an every day smoker but reports she has not been able to smoke due to the cough.   The history is provided by the patient. No language interpreter was used.  Cough  This is a new problem. The current episode started more than 1 week ago. The cough is productive of sputum. The maximum temperature recorded prior to her arrival was 101 to 101.9 F. Associated symptoms include chills, sore throat, myalgias and shortness of breath. Pertinent negatives include no ear pain and no headaches. She has tried cough syrup for the symptoms. She is a smoker.    Past Medical History:  Diagnosis Date  . Abnormal mammogram 12/00   repeat normal  . Abscess breast 7/80  . Anxiety   . Breast abscess of female 07/18/2011  . Cervical disc displacement    diagnosed by MRI--sees dr Cyndy Freeze, evaluation by disability determination services for permanent disability 04/25/00  . Depression   . Dizziness   . Mental and behavioral problems with learning    verbal IQ 34  . Spinal stenosis    MRI LS spine 9/09  . SPINAL STENOSIS, LUMBAR 06/22/2008    Patient Active Problem List   Diagnosis Date Noted  . Diabetes ([place]) 10/19/2016  . Essential hypertension 12/21/2015  . Pre-diabetes 02/15/2012  . Irritable bowel syndrome (IBS) 10/10/2011  . Personal history of tobacco use 12/18/2010  . Depression with anxiety 04/27/2009  . Hyperlipidemia 12/13/2008  . SPINAL STENOSIS, LUMBAR 06/22/2008  . INSOMNIA, PERSISTENT 01/13/2007  . OBESITY, NOS 08/22/2006    . BACK PAIN, LOW 08/22/2006    Past Surgical History:  Procedure Laterality Date  . ABDOMINAL HYSTERECTOMY    . MYOMECTOMY      OB History    No data available       Home Medications    Prior to Admission medications   Medication Sig Start Date End Date Taking? Authorizing Provider  aspirin 81 MG tablet Take 81 mg by mouth daily.    [provider]  atorvastatin (LIPITOR) 40 MG tablet Take 1 tablet (40 mg total) by mouth daily. 03/14/17   Nuala Alpha, DO  B Complex CAPS Take 2 capsules by mouth daily.    [provider]  benzonatate (TESSALON) 100 MG capsule Take 1 capsule (100 mg total) by mouth every 8 (eight) hours. 05/20/17   Ashley Murrain, NP  Blood Glucose Monitoring Suppl ([place]) w/Device KIT 1 applicator by Does not apply route every morning. 10/19/16   Mikell, Jeani Sow, MD  buPROPion (WELLBUTRIN SR) 150 MG 12 hr tablet Take 1 tablet (132m) daily for the first 3 days. Then take 1 tablet (1558m twice a day (at least 8 hours apart) after that. 10/09/16   McKeag, IaMarylynn PearsonMD  chlorpheniramine-HYDROcodone (TUSSIONEX PENNKINETIC ER) 10-8 MG/5ML SUER Take 5 mLs by mouth at bedtime as needed for cough. 05/20/17   NeAshley MurrainNP  glucose blood [facility]) test strip Use  as instructed 03/04/17   Nuala Alpha, DO  HYDROcodone-acetaminophen (NORCO) 10-325 MG tablet Take one tablet every 4-6 hours prn foot pain. 01/23/17   Wallene Huh, DPM  Lancet Devices (ONE TOUCH DELICA LANCING DEV) MISC 1 applicator by Does not apply route every morning. 10/19/16   Mikell, Jeani Sow, MD  meclizine (ANTIVERT) 25 MG tablet Take 1 tablet (25 mg total) by mouth 3 (three) times daily as needed for dizziness. 03/06/16   Larene Pickett, PA-C  MELATONIN PO Take 1 capsule by mouth at bedtime as needed (sleep).    [provider]  meloxicam (MOBIC) 15 MG tablet Take 1 tablet (15 mg total) daily by mouth. 05/03/17   Mercy Riding, MD   metFORMIN (GLUCOPHAGE) 500 MG tablet Take 2 tablets in the AM and 1 tablet in the PM 03/04/17   Lockamy, Timothy, DO  ondansetron (ZOFRAN) 4 MG tablet Take 1 tablet (4 mg total) by mouth every 6 (six) hours as needed for nausea. 09/28/15   Nicolette Bang, DO  ONETOUCH DELICA LANCETS 80X MISC 30 Units by Does not apply route every morning. 03/04/17   Nuala Alpha, DO  polyethylene glycol powder (GLYCOLAX/MIRALAX) powder Take 17 g by mouth daily. 01/19/16   McKeag, Marylynn Pearson, MD  sertraline (ZOLOFT) 50 MG tablet 1 tablet a day for 1 week. Then 1 tablet twice a day for 1 week. Then 2 tablets in the morning and 1 tablets at night after that. 03/04/17   Nuala Alpha, DO    Family History Family History  Problem Relation Age of Onset  . Parkinsonism Brother   . Dementia Brother   . Hypertension Mother   . Hypertension Father   . Depression Sister   . Diabetes Sister   . Stroke Sister     Social History Social History   Tobacco Use  . Smoking status: Current Every Day Smoker    Packs/day: 1.00    Years: 30.00    Pack years: 30.00    Types: Cigarettes  . Smokeless tobacco: Never Used  . Tobacco comment: thinking about it  Substance Use Topics  . Alcohol use: Yes    Comment: 1 x per month  . Drug use: No     Allergies   Patient has no known allergies.   Review of Systems Review of Systems  Constitutional: Positive for chills and fever.  HENT: Positive for congestion and sore throat. Negative for ear pain.   Eyes: Negative for visual disturbance.  Respiratory: Positive for cough and shortness of breath.   Gastrointestinal: Negative for abdominal pain, diarrhea, nausea and vomiting.  Genitourinary: Negative for dysuria, frequency and urgency.  Musculoskeletal: Positive for myalgias. Negative for back pain.  Skin: Negative for rash.  Neurological: Positive for light-headedness. Negative for dizziness, syncope and headaches.  Psychiatric/Behavioral: Negative for  confusion.     Physical Exam Updated Vital Signs BP 120/83 (BP Location: Right Arm)   Pulse 99   Temp 98.2 F (36.8 C) (Oral)   Resp 20   Ht '5\' 7"'  (1.702 m)   Wt 94.2 kg (207 lb 11.2 oz)   SpO2 99%   BMI 32.53 kg/m   Physical Exam  Constitutional: She appears well-developed and well-nourished. No distress.  HENT:  Head: Normocephalic and atraumatic.  Right Ear: Tympanic membrane normal.  Left Ear: Tympanic membrane normal.  Nose: Nose normal.  Mouth/Throat: Uvula is midline, oropharynx is clear and moist and mucous membranes are normal.  Eyes: Conjunctivae and EOM  are normal. Pupils are equal, round, and reactive to light.  Neck: Normal range of motion. Neck supple.  Cardiovascular: Normal rate and regular rhythm.  Pulmonary/Chest: Effort normal. No respiratory distress. She has no wheezes. She has no rales. Tenderness: bilateral rib tenderness.  Abdominal: Soft. There is no tenderness.  Lymphadenopathy:    She has no cervical adenopathy.  Neurological: She is alert.  Skin: Skin is warm and dry.  Psychiatric: She has a normal mood and affect. Her behavior is normal.  Nursing note and vitals reviewed.    ED Treatments / Results  Labs (all labs ordered are listed, but only abnormal results are displayed) Labs Reviewed - No data to display  Radiology Dg Chest 2 View  Result Date: 05/20/2017 CLINICAL DATA:  Productive cough and generalized chest pain for the past week. EXAM: CHEST  2 VIEW COMPARISON:  None. FINDINGS: The cardiomediastinal silhouette is normal in size. Normal pulmonary vascularity. No focal consolidation, pleural effusion, or pneumothorax. No acute osseous abnormality. Mild degenerative changes of the thoracic spine. Prior cholecystectomy. IMPRESSION: No active cardiopulmonary disease. Electronically Signed   By: Titus Dubin M.D.   On: 05/20/2017 14:01    Procedures Procedures (including critical care time)  Medications Ordered in ED Medications  - No data to display  Dr. Laverta Baltimore in to see the patient and discuss plan of care and smoking causing bronchitis and chronic lung disease.   Initial Impression / Assessment and Plan / ED Course  I have reviewed the triage vital signs and the nursing notes.  Pt CXR negative for acute infiltrate. Patients symptoms are consistent with bronchitis, likely viral etiology. Discussed that antibiotics are not indicated for viral infections. Pt will be discharged with symptomatic treatment.  Verbalizes understanding and is agreeable with plan. Pt is hemodynamically stable & in NAD prior to dc.  Final Clinical Impressions(s) / ED Diagnoses   Final diagnoses:  Acute bronchitis, viral    ED Discharge Orders        Ordered    benzonatate (TESSALON) 100 MG capsule  Every 8 hours     05/20/17 1711    chlorpheniramine-HYDROcodone (TUSSIONEX PENNKINETIC ER) 10-8 MG/5ML SUER  At bedtime PRN     05/20/17 1711       Debroah Baller Learned, NP 05/20/17 1715    Margette Fast, MD 05/21/17 1150

## 2017-05-20 NOTE — Discharge Instructions (Signed)
Take the medication as directed. Follow up with your doctor.  The liquid cough medication will make you sleepy so just take it at night.

## 2017-05-31 NOTE — Telephone Encounter (Signed)
LM for patient letting her know that handicap form is ready for pick up. Jazmin Hartsell,CMA

## 2017-07-18 ENCOUNTER — Ambulatory Visit (INDEPENDENT_AMBULATORY_CARE_PROVIDER_SITE_OTHER): Payer: Medicare Other | Admitting: Student

## 2017-07-18 ENCOUNTER — Encounter: Payer: Self-pay | Admitting: Student

## 2017-07-18 VITALS — BP 120/75 | HR 76 | Temp 97.3°F | Wt 208.8 lb

## 2017-07-18 DIAGNOSIS — K5901 Slow transit constipation: Secondary | ICD-10-CM

## 2017-07-18 DIAGNOSIS — E118 Type 2 diabetes mellitus with unspecified complications: Secondary | ICD-10-CM | POA: Diagnosis not present

## 2017-07-18 DIAGNOSIS — R110 Nausea: Secondary | ICD-10-CM

## 2017-07-18 LAB — POCT GLYCOSYLATED HEMOGLOBIN (HGB A1C): Hemoglobin A1C: 6.7

## 2017-07-18 MED ORDER — ONDANSETRON HCL 4 MG PO TABS: 4.0000 mg | ORAL_TABLET | Freq: Three times a day (TID) | ORAL | 0 refills | Status: DC | PRN

## 2017-07-18 MED ORDER — METFORMIN HCL 1000 MG PO TABS: 1000.0000 mg | ORAL_TABLET | Freq: Two times a day (BID) | ORAL | 3 refills | Status: DC

## 2017-07-18 NOTE — Patient Instructions (Addendum)
It was great seeing you today! We have addressed the following issues today  Nausea: This could be due to constipation.  It could also be due to your diabetes.  I recommend using your MiraLAX 2-3 times a day until you have a normal bowel movement at least every other day.  We also sent a prescription for nausea medication to your pharmacy.   Diabetes: We increased her metformin to 1000 mg twice a day.  We are checking your A1c today.  Please follow-up on this in the next 1-2 weeks.  If we did any lab work today, and the results require attention, either me or my nurse will get in touch with you. If everything is normal, you will get a letter in mail and a message via . If you don't hear from [place] in two weeks, please give [place] a call. Otherwise, we look forward to seeing you again at your next visit. If you have any questions or concerns before then, please call the clinic at [phone].  Please bring all your medications to every doctors visit  Sign up for My Chart to have easy access to your labs results, and communication with your Primary care physician.    Please check-out at the front desk before leaving the clinic.    Take Care,   Dr. Cyndia Skeeters

## 2017-07-18 NOTE — Progress Notes (Signed)
  Subjective:    Jenna Wong is a 67 y.o. old female here for nausea  HPI Nausea: for two to three weeks. Denies emesis. Denies fever, runny nose but had cold two weeks ago. She reports constipation. Last bowel movement about three days ago. Hard and formed in pellets. Denies hematochezia or melena. She has been using Miralax everyday without significant improvement.  Denies substance use.  Not at all.  A constipating medication. Has history of poorly controlled diabetes.  Last A1c 8.9 about 9 months ago. PMH/Problem List: has Hyperlipidemia; OBESITY, NOS; Depression with anxiety; INSOMNIA, PERSISTENT; SPINAL STENOSIS, LUMBAR; BACK PAIN, LOW; Personal history of tobacco use; Irritable bowel syndrome (IBS); Pre-diabetes; Essential hypertension; and Diabetes ([place]) on their problem list.   has a past medical history of Abnormal mammogram (12/00), Abscess breast (7/80), Anxiety, Breast abscess of female (07/18/2011), Cervical disc displacement, Depression, Dizziness, Mental and behavioral problems with learning, Spinal stenosis, and SPINAL STENOSIS, LUMBAR (06/22/2008).  FH:  Family History  Problem Relation Age of Onset  . Parkinsonism Brother   . Dementia Brother   . Hypertension Mother   . Hypertension Father   . Depression Sister   . Diabetes Sister   . Stroke Sister     [facility] Social History   Tobacco Use  . Smoking status: Current Every Day Smoker    Packs/day: 1.00    Years: 30.00    Pack years: 30.00    Types: Cigarettes  . Smokeless tobacco: Never Used  . Tobacco comment: thinking about it  Substance Use Topics  . Alcohol use: Yes    Comment: 1 x per month  . Drug use: No    Review of Systems Review of systems negative except for pertinent positives and negatives in history of present illness above.     Objective:     Vitals:   07/18/17 1433  BP: 120/75  Pulse: 76  Temp: (!) 97.3 F (36.3 C)  TempSrc: Oral  SpO2: 100%  Weight: 208 lb 12.8 oz (94.7 kg)   Body mass  index is 32.7 kg/m.  Physical Exam  GEN: appears well, no apparent distress. CVS: RRR, nl s1 & s2, no murmurs, no edema RESP: no IWOB GI: BS present & normal, soft, NTND GU: no suprapubic or CVA tenderness SKIN: no apparent skin lesion NEURO: alert and oiented appropriately, no gross deficits  PSYCH: euthymic mood with congruent affect    Assessment and Plan:  1. Nausea without vomiting: Unclear etiology.  Likely due to constipation.  It could also be due to her diabetes.  Gave a prescription for Zofran.  Will manage constipation and diabetes as below.  - ondansetron (ZOFRAN) 4 MG tablet; Take 1 tablet (4 mg total) by mouth every 8 (eight) hours as needed for nausea.  Dispense: 10 tablet; Refill: 0  2. Slow transit constipation: Recommended taking him MiraLAX 2-3 times a day until her constipation resolves.  3. Type 2 diabetes mellitus with complication, without long-term current use of insulin ([place]): A1c 6.7%.  Continue metformin 1000 mg twice daily.  Follow-up with PCP.  - metFORMIN (GLUCOPHAGE) 1000 MG tablet; Take 1 tablet (1,000 mg total) by mouth 2 (two) times daily with a meal.  Dispense: 180 tablet; Refill: 3 - HgB A1c  Return in about 2 weeks (around 08/01/2017) for Diabetes.  Mercy Riding, MD 07/18/17 Pager: [phone]

## 2017-07-25 ENCOUNTER — Telehealth: Payer: Self-pay | Admitting: Family Medicine

## 2017-07-25 NOTE — Telephone Encounter (Signed)
DSS form dropped off for at front desk for completion.  Verified that patient section of form has been completed.  Last DOS/WCC with PCP was 07/18/17.  Placed form in team folder to be completed by clinical staff.  Crista Luria

## 2017-07-26 NOTE — Telephone Encounter (Signed)
Placed in MDs box. Britani Beattie, CMA  

## 2017-07-30 NOTE — Telephone Encounter (Signed)
I think this may have been sent to me in error. Let me know if there's anything I need to do.   Olene Floss, MD [place], PGY-3

## 2017-08-01 NOTE — Telephone Encounter (Signed)
LMOVM for pt to pick up form. Gerrick Ray Kennon Holter, CMA

## 2017-08-01 NOTE — Telephone Encounter (Signed)
Called pt and informed her of weight, height, and BP. Deseree Kennon Holter, CMA

## 2017-08-01 NOTE — Telephone Encounter (Signed)
Pt picked up her papers today but they didn't have her height,weight and blood pressure written on them. Please call her back with these numbers

## 2017-08-14 ENCOUNTER — Encounter: Payer: Medicare Other | Admitting: Family Medicine

## 2017-08-27 ENCOUNTER — Other Ambulatory Visit: Payer: Self-pay

## 2017-08-27 MED ORDER — SERTRALINE HCL 50 MG PO TABS: ORAL_TABLET | ORAL | 5 refills | Status: DC

## 2017-08-29 ENCOUNTER — Ambulatory Visit (INDEPENDENT_AMBULATORY_CARE_PROVIDER_SITE_OTHER): Payer: Medicare Other | Admitting: Internal Medicine

## 2017-08-29 ENCOUNTER — Encounter: Payer: Self-pay | Admitting: Internal Medicine

## 2017-08-29 VITALS — BP 103/60 | HR 82 | Temp 97.7°F | Wt 212.2 lb

## 2017-08-29 DIAGNOSIS — R42 Dizziness and giddiness: Secondary | ICD-10-CM | POA: Diagnosis not present

## 2017-08-29 DIAGNOSIS — R110 Nausea: Secondary | ICD-10-CM

## 2017-08-29 MED ORDER — ONDANSETRON HCL 4 MG PO TABS: 4.0000 mg | ORAL_TABLET | Freq: Three times a day (TID) | ORAL | 0 refills | Status: DC | PRN

## 2017-08-29 NOTE — Progress Notes (Signed)
   [facility] Jenna Mo, MD Phone: [phone]   Reason For Visit: Follow up   # Dizziness  Patient present with dizziness for about 1 year. Patient was seen in ED about a year ago for this issue initial and had a negative MRI. She was then seen by Neurology who thought this was likely due to orthostatic hypotension. Patient is now here today with similar symptoms. Patient states that she was having dizziness on Monday with getting up from sitting to standing. It has now been doing better and patient no longer has the dizziness. Patient denies the room spinning. She states she feels off balance. Patient sometimes states she has nauseated with these symptoms. No fevers or chills.  No recent virus. No congestion.   Past Medical History Reviewed problem list.  Medications- reviewed and updated No additions to family history Social history- patient is a non-smoker  Objective: BP 103/60 (BP Location: Right Arm, Patient Position: Sitting, Cuff Size: Normal)   Pulse 82   Temp 97.7 F (36.5 C) (Oral)   Wt 212 lb 3.2 oz (96.3 kg)   SpO2 99%   BMI 33.24 kg/m  Gen: NAD, alert, cooperative with exam HEENT: Normal    Neck: No masses palpated. No lymphadenopathy    Eyes: PERRLA, EOMI Cardio: regular rate and rhythm, S1S2 heard, no murmurs appreciated Pulm: clear to auscultation bilaterally, no wheezes, rhonchi or rales Neurologic exam : Cn 2-7 intact Strength equal & normal in upper & lower extremities Balance normal , finger to nose    Assessment/Plan: See problem based a/p  Dizziness One year of dizziness was seen by neurology and told that she had orthostatic hypotension.  Initially when she started having this dizziness about 1 year ago she had an MRI done that was completely normal.  Her neurologic exam is completely normal today. No new changes in neurological status. Patient states she dizziness  - Ambulatory Referral to Neuro Rehab - ondansetron  (ZOFRAN) 4 MG tablet; Take 1 tablet (4 mg total) by mouth every 8 (eight) hours as needed for nausea.  Dispense: 10 tablet; Refill: 0 -Commended to patient getting an RPR, TSH and B12 level to ensure that there are no other reasons for her balance issues/dizziness -however she did not want to do this today -Also recommended getting an EKG just to check on her heart to make sure there is no association with issues given patient is a longtime smoker, prediabetic and has hypertension-she also did not want to do this today due to potential cost sharing associated with her insurance -If patient returns for this issue would recommend labs and EKG as discussed above

## 2017-08-29 NOTE — Assessment & Plan Note (Addendum)
One year of dizziness was seen by neurology and told that she had orthostatic hypotension.  Initially when she started having this dizziness about 1 year ago she had an MRI done that was completely normal.  Her neurologic exam is completely normal today. No new changes in neurological status. Patient states she dizziness  - Ambulatory Referral to Neuro Rehab - ondansetron (ZOFRAN) 4 MG tablet; Take 1 tablet (4 mg total) by mouth every 8 (eight) hours as needed for nausea.  Dispense: 10 tablet; Refill: 0 -Commended to patient getting an RPR, TSH and B12 level to ensure that there are no other reasons for her balance issues/dizziness -however she did not want to do this today -Also recommended getting an EKG just to check on her heart to make sure there is no association with issues given patient is a longtime smoker, prediabetic and has hypertension-she also did not want to do this today due to potential cost sharing associated with her insurance -If patient returns for this issue would recommend labs and EKG as discussed above

## 2017-08-29 NOTE — Patient Instructions (Addendum)
I am going to send you to see neuro rehab to have them help with your symptoms of dizziness.  Please follow-up if dizziness continues or if you develop chest pain or shortness of breath associated with this dizziness.

## 2017-09-06 ENCOUNTER — Other Ambulatory Visit: Payer: Self-pay

## 2017-09-06 ENCOUNTER — Encounter: Payer: Self-pay | Admitting: Physical Therapy

## 2017-09-06 ENCOUNTER — Ambulatory Visit: Payer: Medicare Other | Attending: Family Medicine | Admitting: Physical Therapy

## 2017-09-06 VITALS — BP 117/73 | HR 81

## 2017-09-06 DIAGNOSIS — R2681 Unsteadiness on feet: Secondary | ICD-10-CM

## 2017-09-06 DIAGNOSIS — R262 Difficulty in walking, not elsewhere classified: Secondary | ICD-10-CM | POA: Insufficient documentation

## 2017-09-06 DIAGNOSIS — R42 Dizziness and giddiness: Secondary | ICD-10-CM | POA: Insufficient documentation

## 2017-09-06 DIAGNOSIS — R296 Repeated falls: Secondary | ICD-10-CM | POA: Diagnosis not present

## 2017-09-08 NOTE — Therapy (Signed)
[facility] [address] [place], [place], [address] Phone: [phone]   Fax:  [phone]  Physical Therapy Evaluation  Patient Details  Name: Jenna Wong MRN: [id number] Date of Birth: 01/21/1951 Referring Provider: Tonette Bihari, MD (Family Medicine)   Encounter Date: 09/06/2017  PT End of Session - 09/08/17 1820    Visit Number  1    Number of Visits  4    Date for PT Re-Evaluation  12/07/17    Authorization Type  [employer] Medicare: $40 copay.  Pt request to be seen 1x/month due to financial hardship    PT Start Time  1020    PT Stop Time  1106    PT Time Calculation (min)  46 min    Activity Tolerance  Patient tolerated treatment well    Behavior During Therapy  WFL for tasks assessed/performed       Past Medical History:  Diagnosis Date  . Abnormal mammogram 12/00   repeat normal  . Abscess breast 7/80  . Anxiety   . Breast abscess of female 07/18/2011  . Cervical disc displacement    diagnosed by MRI--sees dr Cyndy Freeze, evaluation by disability determination services for permanent disability 04/25/00  . Depression   . Dizziness   . 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 Spinal stenosis    MRI LS spine 9/09  . SPINAL STENOSIS, LUMBAR 06/22/2008    Past Surgical History:  Procedure Laterality Date  . ABDOMINAL HYSTERECTOMY    . MYOMECTOMY      Vitals:   09/08/17 1834  BP: 117/73  Pulse: 81     09/06/17 1024  Symptoms/Limitations  Subjective Pt presented to ER x 3 due to dizziness, negative for CVA.  Has episodes of imbalance and feeling "drunk" that last for 3-4 days and feels back to normal.  Went to physician to see if she could be given medication to treat it and she was referred to PT.  "I'm not sure what therapy can do for this."  Pt denies current use of antiobiotics or infection, denies tinnitus or hearing loss, denies changes in vision, HA, sensitivity to  light/smell/sound; does have nausea and vomiting, denies true spinning.  Patient is accompained by: Family member  Pertinent History diabetes, IBS, lumbar spinal stenosis, mental and behavioral problems with learning, depression, cervical disc displacement, breast abscess, anxiety  Limitations Standing;Walking  Patient Stated Goals To return to exercising at [facility] and to prevent falling  Pain Assessment  Currently in Pain? No/denies     09/06/17 1041  Assessment  Medical Diagnosis Dizziness and imbalance  Referring Provider Asiyah Cletis Media, MD (Family Medicine)  Onset Date/Surgical Date 08/29/17 (date of referral)  Prior Therapy none  Precautions  Precautions Other (comment)  Precaution Comments diabetes, IBS, lumbar spinal stenosis, mental and behavioral problems with learning, depression, cervical disc displacement, breast abscess, anxiety  Balance Screen  Has the patient fallen in the past 6 months Yes  How many times? 2  Has the patient had a decrease in activity level because of a fear of falling?  Yes  Is the patient reluctant to leave their home because of a fear of falling?  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; Chief of Staff at [employer]  Observation/Other [employer] on Therapeutic Outcomes (FOTO)  Not completed  Sensation  Light Touch Impaired by gross assessment  Additional Comments tingling in tips of fingers  Coordination  Gross Motor Movements are Fluid and Coordinated Yes      09/06/17 1045  Vestibular Assessment  General Observation Drove heavy equipment for  years; ambulates very slowly  Symptom Behavior  Type of Dizziness Imbalance  Frequency of Dizziness monthly  Duration of Dizziness 3-4 hours  Aggravating Factors Spontaneous onset  Relieving Factors No known relieving factors  Occulomotor Exam  Occulomotor Alignment Normal  Spontaneous Absent  Gaze-induced Absent  Smooth Pursuits Intact  Saccades Poor trajectory (vertical)  Comment Convergence intact  Vestibulo-Occular Reflex  VOR to Slow Head Movement Normal  VOR Cancellation Normal (but reported mild dizziness)  Comment HIT: + to L, - to R  Visual Acuity  Static 8  Dynamic 5 (3 line difference)  Positional Testing  Dix-Hallpike Dix-Hallpike Right;Dix-Hallpike Left  Horizontal Canal Testing Horizontal Canal Right;Horizontal Canal Left  Dix-Hallpike Right  Dix-Hallpike Right Duration 0  Dix-Hallpike Right Symptoms No nystagmus  Dix-Hallpike Left  Dix-Hallpike Left Duration 0  Dix-Hallpike Left Symptoms No nystagmus  Horizontal Canal Right  Horizontal Canal Right Duration 0  Horizontal Canal Right Symptoms Normal  Horizontal Canal Left  Horizontal Canal Left Duration 0  Horizontal Canal Left Symptoms Normal     Objective measurements completed on examination: See above findings.     PT Education - 09/08/17 1818    Education provided  Yes    Education Details  clinical findings, PT POC, goals    Person(s) Educated  Patient;Other (comment) sister    Methods  Explanation    Comprehension  Verbalized understanding       PT Short Term Goals - 09/08/17 1831      PT SHORT TERM GOAL #1   Title  Pt will participate in further gait/balance/vestibular assessment: FGA, gait velocity, MSQ with LTG to be set    Time  1    Period  Months    Status  New    Target Date  10/08/17        PT Long Term Goals - 09/08/17 1829      PT LONG TERM GOAL #1   Title  Pt will demonstrate independence with vestibular/balance HEP and walking program and will report returning to  [facility] [employer]ne[employer]    Time  3    Period  Months    Status  New    Target Date  12/07/17      PT LONG TERM GOAL #2   Title  Pt will report 50% reduction in frequency of disequilibrium/dizzines attacks on a weekly basis    Time  3    Period  Months    Status  New    Target Date  12/07/17      PT LONG TERM GOAL #3   Title  Pt will improve use of VOR as indicated by DVA decrease to 2 line difference    Baseline  8, 5 (3 line difference)    Time  3    Period  Months    Status  New    Target Date  12/07/17      PT LONG TERM GOAL #4   Title  Pt will improve FGA by  8 points    Baseline  TBD    Time  3    Period  Months    Status  New    Target Date  12/07/17      PT LONG TERM GOAL #5   Title  Pt will improve gait velocity to >2.6 ft/sec to improve safety in community    Baseline  TBD    Time  3    Period  Months    Status  New    Target Date  12/07/17             Plan - 09/08/17 1822    Clinical Impression Statement  Pt is a 67 year old female referred to Neuro OPPT for evaluation of chronic dizziness.  Pt's PMH is significant for the following: diabetes, IBS, lumbar spinal stenosis, mental and behavioral problems with learning, depression, cervical disc displacement, breast abscess, anxiety.   The following deficits were noted during pt's exam: disequilibrium with impaired VOR, motion sensitivity, impaired balance and gait.  Pt has also experienced multiple falls due to dizziness placing pt at higher falls risk. Pt would benefit from skilled PT to address these impairments and functional limitations to maximize functional mobility independence and reduce falls risk.    History and Personal Factors relevant to plan of care:  dizziness of unknown etiology-still undergoing work up by MD, independent and was participating in [employer] until dizziness began 1 year ago, multiple falls due to dizziness, diabetes, IBS, lumbar spinal stenosis, mental and behavioral problems  with learning, depression, cervical disc displacement, breast abscess, anxiety    Clinical Presentation  Stable    Clinical Presentation due to:  dizziness of unknown etiology-still undergoing work up by MD, independent and was participating in [employer] until dizziness began 1 year ago, multiple falls due to dizziness, diabetes, IBS, lumbar spinal stenosis, mental and behavioral problems with learning, depression, cervical disc displacement, breast abscess, anxiety    Clinical Decision Making  Low    Rehab Potential  Fair    Clinical Impairments Affecting Rehab Potential  due to financial hardship, only able to come 1x/month    PT Frequency  [place]    PT Duration  12 weeks    PT Treatment/Interventions  ADLs/Self Care Home Management;Canalith Repostioning;Gait training;Stair training;Functional mobility training;Therapeutic activities;Therapeutic exercise;Balance training;Neuromuscular re-education;Patient/family education;Vestibular    PT Next Visit Plan  Patient only comes 1x/month - work mostly on HEP but also assess MSQ/FGA/gait velocity.  X1 viewing, habituation, balance, walking program    PT Home Exercise Plan  X1 viewing, habituation, balance, walking program    Recommended Other Services  referral to ENT if does not improve    Consulted and Agree with Plan of Care  Patient       Patient will benefit from skilled therapeutic intervention in order to improve the following deficits and impairments:  Decreased balance, Difficulty walking, Dizziness  Visit Diagnosis: Dizziness and giddiness  Unsteadiness on feet  Difficulty in walking, not elsewhere classified  Repeated falls     Problem List Patient Active Problem List   Diagnosis Date Noted  . Diabetes ([place]) 10/19/2016  . Dizziness 03/27/2016  . Essential hypertension 12/21/2015  . Pre-diabetes 02/15/2012  . Irritable bowel syndrome (IBS) 10/10/2011  . Personal history of tobacco use 12/18/2010  . Depression with  anxiety 04/27/2009  . Hyperlipidemia 12/13/2008  . SPINAL STENOSIS, LUMBAR 06/22/2008  . INSOMNIA, PERSISTENT 01/13/2007  . OBESITY, NOS 08/22/2006  . BACK PAIN, LOW 08/22/2006  Rico Junker, PT, DPT 09/08/17    6:35 PM    [facility] [address] [place], [place], [address] Phone: [phone]   Fax:  [phone]  Name: Jenna Wong MRN: [id number] Date of Birth: May 23, 1951

## 2017-09-08 NOTE — Progress Notes (Signed)
   09/06/17 1024  Symptoms/Limitations  Subjective Pt presented to ER x 3 due to dizziness, negative for CVA.  Has episodes of imbalance and feeling "drunk" that last for 3-4 days and feels back to normal.  Went to physician to see if she could be given medication to treat it and she was referred to PT.  "I'm not sure what therapy can do for this."  Pt denies current use of antiobiotics or infection, denies tinnitus or hearing loss, denies changes in vision, HA, sensitivity to light/smell/sound; does have nausea and vomiting, denies true spinning.  Patient is accompained by: Family member  Pertinent History diabetes, IBS, lumbar spinal stenosis, mental and behavioral problems with learning, depression, cervical disc displacement, breast abscess, anxiety  Limitations Standing;Walking  Patient Stated Goals To return to exercising at [facility] and to prevent falling  Pain Assessment  Currently in Pain? No/denies

## 2017-09-27 ENCOUNTER — Ambulatory Visit: Payer: Medicare Other | Attending: Family Medicine | Admitting: Physical Therapy

## 2017-10-25 ENCOUNTER — Ambulatory Visit: Payer: Medicare Other | Attending: Family Medicine | Admitting: Physical Therapy

## 2017-11-01 ENCOUNTER — Other Ambulatory Visit: Payer: Self-pay

## 2017-11-01 DIAGNOSIS — E118 Type 2 diabetes mellitus with unspecified complications: Secondary | ICD-10-CM

## 2017-11-01 NOTE — Telephone Encounter (Signed)
Patient left message on nurse line that she needs a new glucometer. Hers is broken.   Call back is [phone]  Danley Danker, RN [facility])

## 2017-11-04 MED ORDER — ONETOUCH VERIO FLEX SYSTEM W/DEVICE KIT: 1.0000 | PACK | 0 refills | Status: DC

## 2017-11-22 ENCOUNTER — Ambulatory Visit: Payer: Medicare Other | Admitting: Physical Therapy

## 2017-11-23 ENCOUNTER — Encounter: Payer: Self-pay | Admitting: Physical Therapy

## 2017-11-23 NOTE — Therapy (Signed)
[facility] [address] [place], [place], [address] Phone: [phone]   Fax:  [phone]  Patient Details  Name: Jenna Wong MRN: [id number] Date of Birth: 06-04-1951 Referring Provider: Tonette Bihari, MD (Family Medicine)  Encounter Date: 11/23/2017   PHYSICAL THERAPY DISCHARGE SUMMARY  Visits from Start of Care: 1  Current functional level related to goals / functional outcomes: Unable to assess; pt did not return for PT treatment sessions after initial evaluation.   Remaining deficits: Dizziness, imbalance   Education / Equipment: N/A  Plan: Patient agrees to discharge.  Patient goals were not met. Patient is being discharged due to not returning since the last visit.  ?????     Rico Junker, PT, DPT 11/23/17    2:35 PM   [facility] [address] [place], [place], [address] Phone: [phone]   Fax:  [phone]

## 2017-12-02 ENCOUNTER — Other Ambulatory Visit: Payer: Self-pay | Admitting: Internal Medicine

## 2017-12-02 DIAGNOSIS — E118 Type 2 diabetes mellitus with unspecified complications: Secondary | ICD-10-CM

## 2017-12-13 ENCOUNTER — Encounter: Payer: Medicare Other | Admitting: Physical Therapy

## 2017-12-14 ENCOUNTER — Other Ambulatory Visit: Payer: Self-pay

## 2017-12-14 ENCOUNTER — Encounter (HOSPITAL_COMMUNITY): Payer: Self-pay | Admitting: Emergency Medicine

## 2017-12-14 ENCOUNTER — Emergency Department (HOSPITAL_COMMUNITY)
Admission: EM | Admit: 2017-12-14 | Discharge: 2017-12-14 | Disposition: A | Discharge status: Home / Self Care | Payer: Medicare Other | Attending: Emergency Medicine | Admitting: Emergency Medicine

## 2017-12-14 ENCOUNTER — Emergency Department (HOSPITAL_COMMUNITY): Payer: Medicare Other

## 2017-12-14 DIAGNOSIS — Z79899 Other long term (current) drug therapy: Secondary | ICD-10-CM | POA: Insufficient documentation

## 2017-12-14 DIAGNOSIS — J069 Acute upper respiratory infection, unspecified: Secondary | ICD-10-CM | POA: Diagnosis not present

## 2017-12-14 DIAGNOSIS — J40 Bronchitis, not specified as acute or chronic: Secondary | ICD-10-CM | POA: Diagnosis not present

## 2017-12-14 DIAGNOSIS — Z7984 Long term (current) use of oral hypoglycemic drugs: Secondary | ICD-10-CM | POA: Insufficient documentation

## 2017-12-14 DIAGNOSIS — R05 Cough: Secondary | ICD-10-CM | POA: Diagnosis not present

## 2017-12-14 DIAGNOSIS — F17210 Nicotine dependence, cigarettes, uncomplicated: Secondary | ICD-10-CM | POA: Diagnosis not present

## 2017-12-14 DIAGNOSIS — E119 Type 2 diabetes mellitus without complications: Secondary | ICD-10-CM | POA: Insufficient documentation

## 2017-12-14 DIAGNOSIS — J029 Acute pharyngitis, unspecified: Secondary | ICD-10-CM | POA: Diagnosis present

## 2017-12-14 DIAGNOSIS — R0602 Shortness of breath: Secondary | ICD-10-CM | POA: Diagnosis not present

## 2017-12-14 DIAGNOSIS — B9789 Other viral agents as the cause of diseases classified elsewhere: Secondary | ICD-10-CM

## 2017-12-14 LAB — GROUP A STREP BY PCR: GROUP A STREP BY PCR: NOT DETECTED

## 2017-12-14 MED ORDER — BENZONATATE 100 MG PO CAPS: 100.0000 mg | ORAL_CAPSULE | Freq: Three times a day (TID) | ORAL | 0 refills | Status: DC

## 2017-12-14 MED ORDER — BENZONATATE 100 MG PO CAPS
100.0000 mg | ORAL_CAPSULE | Freq: Once | ORAL | Status: AC
  Administered 2017-12-14: 100 mg via ORAL
  Filled 2017-12-14: qty 1

## 2017-12-14 MED ORDER — ALBUTEROL SULFATE (2.5 MG/3ML) 0.083% IN NEBU: 5.0000 mg | INHALATION_SOLUTION | Freq: Once | RESPIRATORY_TRACT | Status: DC

## 2017-12-14 MED ORDER — PREDNISONE 50 MG PO TABS: 50.0000 mg | ORAL_TABLET | Freq: Every day | ORAL | 0 refills | Status: DC

## 2017-12-14 MED ORDER — ALBUTEROL SULFATE HFA 108 (90 BASE) MCG/ACT IN AERS
2.0000 | INHALATION_SPRAY | Freq: Once | RESPIRATORY_TRACT | Status: AC
  Administered 2017-12-14: 2 via RESPIRATORY_TRACT
  Filled 2017-12-14: qty 6.7

## 2017-12-14 NOTE — Discharge Instructions (Addendum)
Drink plenty of fluids.  Rest.  Inhaler 2 puffs every 3-4 hours.  Prednisone as prescribed until gone.  Tessalon for cough.  Follow-up with family doctor next week.  Return if worsening

## 2017-12-14 NOTE — ED Provider Notes (Signed)
[facility] DEPT Provider Note   CSN: [id number] Arrival date & time: 12/14/17  0427     History   Chief Complaint Chief Complaint  Patient presents with  . Sore Throat    HPI Jenna Wong is a 67 y.o. female.  HPI Jenna Wong is a 67 y.o. female presents to emergency department complaining of nasal congestion, ear pain bilaterally, sore throat, cough for several days.  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  She denies any fever.  She reports chills.  Denies any chest pain, but states that she has diffuse body aches and at times feels short of breath.  She has been taking DayQuil and NyQuil with no relief.  She has been using Vicks vapor rub for coughing.  Denies any nausea vomiting.  No diarrhea.  No other complaints  Past Medical History:  Diagnosis Date  . Abnormal mammogram 12/00   repeat normal  . Abscess breast 7/80  . Anxiety   . Breast abscess of female 07/18/2011  . Cervical disc displacement    diagnosed by MRI--sees dr Cyndy Freeze, evaluation by disability determination services for permanent disability 04/25/00  . Depression   . Dizziness   . Mental and behavioral problems with learning    verbal IQ 22  . Spinal stenosis    MRI LS spine 9/09  . SPINAL STENOSIS, LUMBAR 06/22/2008    Patient Active Problem List   Diagnosis Date Noted  . Diabetes ([place]) 10/19/2016  . Dizziness 03/27/2016  . Essential hypertension 12/21/2015  . Pre-diabetes 02/15/2012  . Irritable bowel syndrome (IBS) 10/10/2011  . Personal history of tobacco use 12/18/2010  . Depression with anxiety 04/27/2009  . Hyperlipidemia 12/13/2008  . SPINAL STENOSIS, LUMBAR 06/22/2008  . INSOMNIA, PERSISTENT 01/13/2007  . OBESITY, NOS 08/22/2006  . BACK PAIN, LOW 08/22/2006    Past Surgical History:  Procedure Laterality Date  . ABDOMINAL HYSTERECTOMY    . MYOMECTOMY       OB History   None      Home  Medications    Prior to Admission medications   Medication Sig Start Date End Date Taking? Authorizing Provider  aspirin 81 MG tablet Take 81 mg by mouth daily.    [provider]  atorvastatin (LIPITOR) 40 MG tablet Take 1 tablet (40 mg total) by mouth daily. 03/14/17   Nuala Alpha, DO  B Complex CAPS Take 2 capsules by mouth daily.    [provider]  Blood Glucose Monitoring Suppl ([place]) w/Device KIT 1 applicator by Does not apply route every morning. 11/04/17   Nuala Alpha, DO  buPROPion (WELLBUTRIN SR) 150 MG 12 hr tablet Take 1 tablet (151m) daily for the first 3 days. Then take 1 tablet (1514m twice a day (at least 8 hours apart) after that. 10/09/16   McKeag, IaMarylynn PearsonMD  glucose blood (ONETOUCH VERIO) test strip Use as instructed 03/04/17   LoNuala AlphaDO  Lancet Devices (ONE TOUCH DELICA LANCING DEV) MISC 1 applicator by Does not apply route every morning. 10/19/16   Mikell, AsJeani SowMD  meclizine (ANTIVERT) 25 MG tablet Take 1 tablet (25 mg total) by mouth 3 (three) times daily as needed for dizziness. 03/06/16   SaLarene PickettPA-C  MELATONIN PO Take 1 capsule by mouth at bedtime as needed (sleep).    [provider]  meloxicam (MOBIC) 15 MG tablet Take 1 tablet (  15 mg total) daily by mouth. 05/03/17   Mercy Riding, MD  metFORMIN (GLUCOPHAGE) 1000 MG tablet Take 1 tablet (1,000 mg total) by mouth 2 (two) times daily with a meal. 07/18/17   Gonfa, Charlesetta Ivory, MD  ondansetron (ZOFRAN) 4 MG tablet Take 1 tablet (4 mg total) by mouth every 8 (eight) hours as needed for nausea. 08/29/17   Mikell, Jeani Sow, MD  [facility] DELICA LANCETS 01U MISC 30 Units by Does not apply route every morning. 03/04/17   Nuala Alpha, DO  ONETOUCH VERIO test strip USE AS DIRECTED 12/02/17   Nuala Alpha, DO  polyethylene glycol powder (GLYCOLAX/MIRALAX) powder Take 17 g by mouth daily. 01/19/16   McKeag, Marylynn Pearson, MD  sertraline (ZOLOFT) 50 MG  tablet 1 tablet a day for 1 week. Then 1 tablet twice a day for 1 week. Then 2 tablets in the morning and 1 tablets at night after that. 08/27/17   Nuala Alpha, DO    Family History Family History  Problem Relation Age of Onset  . Parkinsonism Brother   . Dementia Brother   . Hypertension Mother   . Hypertension Father   . Depression Sister   . Diabetes Sister   . Stroke Sister     Social History Social History   Tobacco Use  . Smoking status: Current Every Day Smoker    Packs/day: 1.00    Years: 30.00    Pack years: 30.00    Types: Cigarettes  . Smokeless tobacco: Never Used  . Tobacco comment: thinking about it  Substance Use Topics  . Alcohol use: Yes    Comment: 1 x per month  . Drug use: No     Allergies   Patient has no known allergies.   Review of Systems Review of Systems  Constitutional: Positive for chills. Negative for fever.  HENT: Positive for congestion, ear pain and sore throat.   Respiratory: Positive for cough and shortness of breath. Negative for chest tightness.   Cardiovascular: Negative for chest pain, palpitations and leg swelling.  Gastrointestinal: Negative for abdominal pain, diarrhea, nausea and vomiting.  Genitourinary: Negative for dysuria, flank pain, pelvic pain, vaginal bleeding, vaginal discharge and vaginal pain.  Musculoskeletal: Positive for arthralgias and myalgias. Negative for neck pain and neck stiffness.  Skin: Negative for rash.  Neurological: Positive for headaches. Negative for dizziness and weakness.  All other systems reviewed and are negative.    Physical Exam Updated Vital Signs BP 132/89 (BP Location: Left Arm)   Pulse 99   Temp 98.8 F (37.1 C) (Oral)   Resp 15   Ht 5' 7.5" (1.715 m)   Wt 89.8 kg (198 lb)   SpO2 97%   BMI 30.55 kg/m   Physical Exam  Constitutional: She is oriented to person, place, and time. She appears well-developed and well-nourished. No distress.  HENT:  Head: Normocephalic.    Right Ear: External ear normal.  Left Ear: External ear normal.  Oropharynx erythematous, uvula midline, tonsils are normal with no exudate.  Clear rhinorrhea and mucosal edema present.  TMs are normal bilaterally  Eyes: Conjunctivae are normal.  Neck: Neck supple.  Cardiovascular: Normal rate, regular rhythm and normal heart sounds.  Pulmonary/Chest: Effort normal and breath sounds normal. No respiratory distress. She has no wheezes. She has no rales.  Abdominal: Soft. Bowel sounds are normal. She exhibits no distension. There is no tenderness. There is no rebound.  Musculoskeletal: She exhibits no edema.  Lymphadenopathy:    She  has no cervical adenopathy.  Neurological: She is alert and oriented to person, place, and time.  Skin: Skin is warm and dry.  Psychiatric: She has a normal mood and affect. Her behavior is normal.  Nursing note and vitals reviewed.    ED Treatments / Results  Labs (all labs ordered are listed, but only abnormal results are displayed) Labs Reviewed  GROUP A STREP BY PCR    EKG EKG Interpretation  Date/Time:  Saturday December 14 2017 04:55:16 EDT Ventricular Rate:  85 PR Interval:    QRS Duration: 86 QT Interval:  357 QTC Calculation: 425 R Axis:   81 Text Interpretation:  Sinus rhythm Borderline right axis deviation Low voltage, extremity and precordial leads No significant change was found Confirmed by Shanon Rosser [phone]) on 12/14/2017 4:59:22 AM   Radiology Dg Chest 2 View  Result Date: 12/14/2017 CLINICAL DATA:  Shortness of breath, cough, and sore throat for a few days. Current smoker. EXAM: CHEST - 2 VIEW COMPARISON:  05/20/2017 FINDINGS: The heart size and mediastinal contours are within normal limits. Both lungs are clear. The visualized skeletal structures are unremarkable. IMPRESSION: No active cardiopulmonary disease. Electronically Signed   By: Lucienne Capers M.D.   On: 12/14/2017 04:59    Procedures Procedures (including critical  care time)  Medications Ordered in ED Medications  benzonatate (TESSALON) capsule 100 mg (100 mg Oral Given 12/14/17 0534)  albuterol (PROVENTIL HFA;VENTOLIN HFA) 108 (90 Base) MCG/ACT inhaler 2 puff (2 puffs Inhalation Given 12/14/17 0536)     Initial Impression / Assessment and Plan / ED Course  I have reviewed the triage vital signs and the nursing notes.  Pertinent labs & imaging results that were available during my care of the patient were reviewed by me and considered in my medical decision making (see chart for details).     Patient is a smoker, here with URI symptoms for several days.  Really complaining of severe cough and shortness of breath and wheezing.  Currently lungs are clear.  Chest x-ray is negative.  Vital signs all within normal.  Rapid strep is negative.  Due to reported wheezing, will start prednisone burst, will give Tessalon for cough.  Is most likely viral.  Discussed giving it a few more days and following up with her family doctor next week.  Return precautions discussed.  Vitals:   12/14/17 0439 12/14/17 0440  BP: 132/89   Pulse: 99   Resp: 15   Temp: 98.8 F (37.1 C)   TempSrc: Oral   SpO2: 97%   Weight:  89.8 kg (198 lb)  Height:  5' 7.5" (1.715 m)     Final Clinical Impressions(s) / ED Diagnoses   Final diagnoses:  Viral URI with cough  Bronchitis    ED Discharge Orders        Ordered    predniSONE (DELTASONE) 50 MG tablet  Daily     12/14/17 0625    benzonatate (TESSALON) 100 MG capsule  Every 8 hours     12/14/17 0625       Jeannett Senior, PA-C 12/14/17 0626    Molpus, Jenny Reichmann, MD 12/14/17 [phone]

## 2017-12-14 NOTE — ED Triage Notes (Signed)
Patient is complaining of sob, coughing, and sore throat. Patient states that she has been having a sore throat for a few days. Patient states her arthritis hurts also.

## 2018-01-03 ENCOUNTER — Encounter: Payer: Medicare Other | Admitting: Physical Therapy

## 2018-01-10 ENCOUNTER — Other Ambulatory Visit: Payer: Self-pay

## 2018-01-10 ENCOUNTER — Ambulatory Visit (INDEPENDENT_AMBULATORY_CARE_PROVIDER_SITE_OTHER): Payer: Medicare Other | Admitting: Family Medicine

## 2018-01-10 VITALS — BP 112/70 | HR 77 | Temp 98.3°F | Ht 67.5 in | Wt 205.6 lb

## 2018-01-10 DIAGNOSIS — H1033 Unspecified acute conjunctivitis, bilateral: Secondary | ICD-10-CM | POA: Insufficient documentation

## 2018-01-10 MED ORDER — BACITRACIN-POLYMYXIN B 500-10000 UNIT/GM OP OINT: 1.0000 "application " | TOPICAL_OINTMENT | Freq: Two times a day (BID) | OPHTHALMIC | 0 refills | Status: AC

## 2018-01-10 NOTE — Patient Instructions (Signed)
Thank you for coming in to see [place] today. Please see below to review our plan for today's visit.  Applied the eye ointment twice daily for 5 days.  As for your sore throat, use ibuprofen up to 800 mg every 8 hours as needed for pain.  For immediate relief, consider over-the-counter Chloraseptic spray.  You can also use a tablespoon of honey 3 times daily to coat the throat to reduce coughing.  It is important that you stop smoking as this can delay her recovery period of these infections.  Please call the clinic at [phone] if your symptoms worsen or you have any concerns. It was our pleasure to serve you.  Harriet [place], [place], PGY-3

## 2018-01-10 NOTE — Progress Notes (Signed)
   Subjective   Patient ID: Jenna Wong    DOB: [birth year], 67 y.o. female   MRN: [id number]  CC: "Eye pain"  HPI: Jenna Wong is a 67 y.o. female who presents to clinic today for the following:  URI  Has been sick for 7 days. Nasal discharge: no Medications tried: visine without relief, sample eye drop (unknown) Sick contacts: no  Symptoms Fever: no Headache or face pain: headache and some nasal pressure Tooth pain: no Sneezing: no Scratchy throat: yes Allergies: no Muscle aches: no Severe fatigue: no Stiff neck: no Shortness of breath: no Rash: no Sore throat or swollen glands: yes (sore throat)  Of note, patient was seen approximately 1 month ago at [facility] ED for viral URI symptoms including nasal congestion, ear pain, sore throat and cough.  CXR was negative and rapid strep negative.  She was diagnosed with viral URI and discharged with steroid burst due to increased work of breathing and some wheezing along with [employer].  Patient states her symptoms improved over several days.  This current illness occurred over 1 week.  She does endorse some discharge and matting with her left eye.  She denies change of vision, double vision, photosensitivity or halos around lights.  She has no pain with ocular movement he denies fevers or chills.  ROS: see HPI for pertinent.  [place]: DM, HTN, HLD, tobacco use disorder, spinal stenosis, depression anxiety, obesity.  Surgical history myomectomy, TAH.  Family history HTN, depression, DM, stroke, parkinsonism, dementia.  Smoking status reviewed. Medications reviewed.  Objective   BP 112/70   Pulse 77   Temp 98.3 F (36.8 C) (Oral)   Ht 5' 7.5" (1.715 m)   Wt 205 lb 9.6 oz (93.3 kg)   SpO2 99%   BMI 31.73 kg/m  Vitals and nursing note reviewed.  General: well nourished, well developed, NAD with non-toxic appearance HEENT: normocephalic, atraumatic, moist mucous membranes, mild conjunctival erythema particularly around left  eye without obvious purulence and some increase tearing, PERRLA, EOMI, tonsils without edema or purulence, no proptosis Neck: supple, non-tender without lymphadenopathy Cardiovascular: regular rate and rhythm without murmurs, rubs, or gallops Lungs: clear to auscultation bilaterally with normal work of breathing Skin: warm, dry, no rashes or lesions, cap refill < 2 seconds Extremities: warm and well perfused, normal tone, no edema  Assessment & Plan   Acute bacterial conjunctivitis of both eyes Acute.  Likely related to recent viral URI.  Does endorse discharge primarily on left eye with matting concerning for possible bacterial conjunctivitis.  No signs on exam today.  Patient's lungs clear to auscultation without signs of COPD exacerbation patient unfortunately continues to smoke daily, 0.5 packs/day. - Advised to discontinue smoking - Given Polysporin ointment with instructions to use twice daily for 5 days - Reviewed return precautions  No orders of the defined types were placed in this encounter.  Meds ordered this encounter  Medications  . bacitracin-polymyxin b (POLYSPORIN) ophthalmic ointment    Sig: Place 1 application into both eyes every 12 (twelve) hours for 5 days. apply to eye every 12 hours while awake    Dispense:  3.5 g    Refill:  0    Harriet Butte, [place], PGY-3 01/10/2018, 12:34 PM

## 2018-01-10 NOTE — Assessment & Plan Note (Signed)
Acute.  Likely related to recent viral URI.  Does endorse discharge primarily on left eye with matting concerning for possible bacterial conjunctivitis.  No signs on exam today.  Patient's lungs clear to auscultation without signs of COPD exacerbation patient unfortunately continues to smoke daily, 0.5 packs/day. - Advised to discontinue smoking - Given Polysporin ointment with instructions to use twice daily for 5 days - Reviewed return precautions

## 2018-02-19 ENCOUNTER — Other Ambulatory Visit: Payer: Self-pay | Admitting: Family Medicine

## 2018-02-19 DIAGNOSIS — Z1231 Encounter for screening mammogram for malignant neoplasm of breast: Secondary | ICD-10-CM

## 2018-02-25 ENCOUNTER — Inpatient Hospital Stay: Admission: RE | Admit: 2018-02-25 | Payer: Medicare Other | Source: Ambulatory Visit

## 2018-03-21 ENCOUNTER — Other Ambulatory Visit: Payer: Self-pay | Admitting: Family Medicine

## 2018-03-21 DIAGNOSIS — E118 Type 2 diabetes mellitus with unspecified complications: Secondary | ICD-10-CM

## 2018-04-07 ENCOUNTER — Ambulatory Visit
Admission: RE | Admit: 2018-04-07 | Discharge: 2018-04-07 | Disposition: A | Discharge status: Home / Self Care | Payer: Medicare Other | Source: Ambulatory Visit | Attending: Family Medicine | Admitting: Family Medicine

## 2018-04-07 ENCOUNTER — Ambulatory Visit: Payer: Medicare Other

## 2018-04-07 ENCOUNTER — Encounter (INDEPENDENT_AMBULATORY_CARE_PROVIDER_SITE_OTHER): Payer: Self-pay

## 2018-04-07 DIAGNOSIS — Z1231 Encounter for screening mammogram for malignant neoplasm of breast: Secondary | ICD-10-CM | POA: Diagnosis not present

## 2018-04-18 ENCOUNTER — Other Ambulatory Visit: Payer: Self-pay | Admitting: *Deleted

## 2018-04-21 ENCOUNTER — Ambulatory Visit (INDEPENDENT_AMBULATORY_CARE_PROVIDER_SITE_OTHER): Payer: Medicare Other | Admitting: Family Medicine

## 2018-04-21 VITALS — BP 130/80 | HR 88 | Temp 98.2°F | Wt 208.0 lb

## 2018-04-21 DIAGNOSIS — R200 Anesthesia of skin: Secondary | ICD-10-CM

## 2018-04-21 DIAGNOSIS — R42 Dizziness and giddiness: Secondary | ICD-10-CM | POA: Diagnosis not present

## 2018-04-21 DIAGNOSIS — Z23 Encounter for immunization: Secondary | ICD-10-CM | POA: Diagnosis not present

## 2018-04-21 DIAGNOSIS — E118 Type 2 diabetes mellitus with unspecified complications: Secondary | ICD-10-CM

## 2018-04-21 DIAGNOSIS — Z87891 Personal history of nicotine dependence: Secondary | ICD-10-CM

## 2018-04-21 DIAGNOSIS — Z0000 Encounter for general adult medical examination without abnormal findings: Secondary | ICD-10-CM | POA: Insufficient documentation

## 2018-04-21 HISTORY — DX: Anesthesia of skin: R20.0

## 2018-04-21 LAB — POCT GLYCOSYLATED HEMOGLOBIN (HGB A1C): HbA1c, POC (controlled diabetic range): 6.4 % (ref 0.0–7.0)

## 2018-04-21 MED ORDER — POLYETHYLENE GLYCOL 3350 17 GM/SCOOP PO POWD: 17.0000 g | Freq: Every day | ORAL | 1 refills | Status: DC

## 2018-04-21 MED ORDER — MECLIZINE HCL 25 MG PO TABS: 25.0000 mg | ORAL_TABLET | Freq: Three times a day (TID) | ORAL | 0 refills | Status: DC | PRN

## 2018-04-21 MED ORDER — ONDANSETRON HCL 4 MG PO TABS: 4.0000 mg | ORAL_TABLET | Freq: Three times a day (TID) | ORAL | 0 refills | Status: DC | PRN

## 2018-04-21 MED ORDER — ATORVASTATIN CALCIUM 40 MG PO TABS: 40.0000 mg | ORAL_TABLET | Freq: Every day | ORAL | 3 refills | Status: DC

## 2018-04-21 NOTE — Assessment & Plan Note (Signed)
Discussed smoking cessation with patient.  Patient is not ready at this time but would like information on assistance.  Information for 1 800 quit now hotline given to patient as well as instructed patient to follow-up with PCP or Dr. Valentina Lucks for tobacco cessation.  He has tried patches, gum, medications in the past.

## 2018-04-21 NOTE — Progress Notes (Signed)
   Subjective:    Patient ID: Jenna Wong, female    DOB: 1951-02-02, 67 y.o.   MRN: [id number]   CC: bilateral hand numbness  HPI: Bilateral hand numbness Patient presenting today with bilateral hand numbness that began 2 weeks ago.  Patient reports is progressively worsening.  States that it is in the palmar aspects of her hands.  States that even today when she had her A1c check she cannot feel her needle prick.  Has been using metformin for her diabetes.  Patient was worried that her sister told her it is normal and diabetes.  Denies any pain.  Patient states she is retired and does not use her hands or wrist very often.  Does not type on a computer very often.  Reports no weakness.  Reports no recent blood loss or GI bleeding.  No fevers or chills.  No chest pain or shortness of breath.  Patient is an active smoker smoking 1 pack/day for the past 30 years.  Patient has tried quitting in the past but is unsuccessful as when life stressors occur she goes back to smoking.  Objective:  BP 130/80   Pulse 88   Temp 98.2 F (36.8 C)   Wt 208 lb (94.3 kg)   SpO2 98%   BMI 32.10 kg/m  Vitals and nursing note reviewed  General: well nourished, in no acute distress HEENT: normocephalic, moist mucous membranes  Cardiac: RRR, clear S1 and S2, no murmurs, rubs, or gallops Respiratory: clear to auscultation bilaterally, no increased work of breathing Abdomen: soft, nontender, nondistended, no masses or organomegaly. Bowel sounds present Extremities: no edema or cyanosis. Warm, well perfused. 2+ radial pulses bilaterally, Provacative testing of carpel tunnel positive, Neg tinnel sign  Skin: warm and dry, no rashes noted Neuro: alert and oriented, no focal deficits   Assessment & Plan:    Bilateral hand numbness Patient presenting with bilateral hand numbness.  Likely secondary to carpal tunnel syndrome his provocative testing for carpal tunnel syndrome was positive.  Can also consider  diabetic neuropathy however he is well controlled per A1c of 6.4.  Patient is a 30-pack-year smoker so possibly some kind of vasculitis issue.  Can consider anemia and differential.  He is also consider electrolyte abnormalities.  Will obtain CBC and BMP to rule these out.  Severe B12 deficiency so will obtain B12 level.  Can also consider thyroid dysfunction so will obtain TSH.  Have advised patient to use response at nighttime.  Patient already has a follow-up appointment scheduled in the beginning of November with her PCP and will follow up for hand numbness then as well.  If no improvement advised to follow-up sooner.    Discussed patient with Dr. Andria Frames.  Healthcare maintenance Flu vaccine given today   Personal history of tobacco use Discussed smoking cessation with patient.  Patient is not ready at this time but would like information on assistance.  Information for 1 800 quit now hotline given to patient as well as instructed patient to follow-up with PCP or Dr. Valentina Lucks for tobacco cessation.  He has tried patches, gum, medications in the past.     Return in about 2 weeks (around 05/05/2018), or if symptoms worsen or fail to improve.   Caroline More, DO, PGY-2

## 2018-04-21 NOTE — Patient Instructions (Addendum)
It was a pleasure seeing you today.   Today we discussed your numbness  For your numbness: please wear wrist splints at night. You can get these at your local pharmacy. I have obtained labs and will call with the results or send a letter.   Please follow up in 2-3 weeks if no improvement or sooner if symptoms persist or worsen. Please call the clinic immediately if you have any concerns.   Our clinic's number is [phone]. Please call with questions or concerns.   For smoking cessation consider the [place] hotline or making an appointment with Dr. Valentina Lucks  Thank you,  Caroline More, DO

## 2018-04-21 NOTE — Assessment & Plan Note (Signed)
Flu vaccine given today. 

## 2018-04-21 NOTE — Assessment & Plan Note (Signed)
Patient presenting with bilateral hand numbness.  Likely secondary to carpal tunnel syndrome his provocative testing for carpal tunnel syndrome was positive.  Can also consider diabetic neuropathy however he is well controlled per A1c of 6.4.  Patient is a 30-pack-year smoker so possibly some kind of vasculitis issue.  Can consider anemia and differential.  He is also consider electrolyte abnormalities.  Will obtain CBC and BMP to rule these out.  Severe B12 deficiency so will obtain B12 level.  Can also consider thyroid dysfunction so will obtain TSH.  Have advised patient to use response at nighttime.  Patient already has a follow-up appointment scheduled in the beginning of November with her PCP and will follow up for hand numbness then as well.  If no improvement advised to follow-up sooner.    Discussed patient with Dr. Andria Frames.

## 2018-04-22 ENCOUNTER — Telehealth: Payer: Self-pay | Admitting: *Deleted

## 2018-04-22 LAB — BASIC METABOLIC PANEL
BUN / CREAT RATIO: 11 — AB (ref 12–28)
BUN: 10 mg/dL (ref 8–27)
CALCIUM: 9.9 mg/dL (ref 8.7–10.3)
CHLORIDE: 103 mmol/L (ref 96–106)
CO2: 25 mmol/L (ref 20–29)
CREATININE: 0.88 mg/dL (ref 0.57–1.00)
GFR calc Af Amer: 80 mL/min/{1.73_m2} (ref >59)
GFR calc non Af Amer: 70 mL/min/{1.73_m2} (ref >59)
Glucose: 148 mg/dL — ABNORMAL HIGH (ref 65–99)
Potassium: 4.5 mmol/L (ref 3.5–5.2)
Sodium: 142 mmol/L (ref 134–144)

## 2018-04-22 LAB — CBC
HEMOGLOBIN: 14.3 g/dL (ref 11.1–15.9)
Hematocrit: 41.7 % (ref 34.0–46.6)
MCH: 31 pg (ref 26.6–33.0)
MCHC: 34.3 g/dL (ref 31.5–35.7)
MCV: 90 fL (ref 79–97)
Platelets: 279 10*3/uL (ref 150–450)
RBC: 4.62 x10E6/uL (ref 3.77–5.28)
RDW: 13.1 % (ref 12.3–15.4)
WBC: 10 10*3/uL (ref 3.4–10.8)

## 2018-04-22 LAB — VITAMIN B12: Vitamin B-12: 538 pg/mL (ref 232–1245)

## 2018-04-22 LAB — TSH: TSH: 0.519 u[IU]/mL (ref 0.450–4.500)

## 2018-04-22 NOTE — Telephone Encounter (Signed)
Pt informed. Taneesha Edgin Dawn, CMA  

## 2018-04-22 NOTE — Telephone Encounter (Signed)
Left message for pt to call us back. [place] Shannahan Kennon Holter, CMA

## 2018-04-22 NOTE — Telephone Encounter (Signed)
-----   Message from Caroline More, DO sent at 04/22/2018 11:48 AM EDT ----- Please inform patient that labs are normal. She should continue to wear wrist splints and follow up with PCP

## 2018-04-23 ENCOUNTER — Telehealth: Payer: Self-pay | Admitting: *Deleted

## 2018-04-23 NOTE — Telephone Encounter (Signed)
LMOVM for pt to call us back. Deseree Blount, CMA  

## 2018-04-23 NOTE — Telephone Encounter (Signed)
-----   Message from Caroline More, DO sent at 04/22/2018 11:48 AM EDT ----- Please inform patient that labs are normal. She should continue to wear wrist splints and follow up with PCP

## 2018-04-25 ENCOUNTER — Ambulatory Visit: Payer: Medicare Other | Admitting: Family Medicine

## 2018-06-05 ENCOUNTER — Other Ambulatory Visit: Payer: Self-pay

## 2018-06-05 NOTE — Patient Outreach (Signed)
[place] [facility]) Care Management  06/05/2018  Jenna Wong [birth year] [id number]   Medication Adherence call to [name] spoke with patient she is due on Metformin 1000 mg she ask if we can call [place] or doctor's office if there [place] no refill ,[facility] said there is one more refill and will have it ready for the patient. [name] is showing past due under [place].   [place] Management Direct Dial [phone]  Fax [phone] [email or web address]

## 2018-09-09 ENCOUNTER — Ambulatory Visit: Payer: Medicare Other | Admitting: Family Medicine

## 2018-09-18 ENCOUNTER — Other Ambulatory Visit: Payer: Self-pay

## 2018-09-18 DIAGNOSIS — E118 Type 2 diabetes mellitus with unspecified complications: Secondary | ICD-10-CM

## 2018-09-18 MED ORDER — METFORMIN HCL 1000 MG PO TABS: 1000.0000 mg | ORAL_TABLET | Freq: Two times a day (BID) | ORAL | 3 refills | Status: DC

## 2018-09-18 MED ORDER — SERTRALINE HCL 50 MG PO TABS: ORAL_TABLET | ORAL | 5 refills | Status: DC

## 2018-09-24 ENCOUNTER — Ambulatory Visit: Payer: Medicare Other | Admitting: Family Medicine

## 2018-10-21 ENCOUNTER — Other Ambulatory Visit: Payer: Self-pay | Admitting: Family Medicine

## 2018-10-22 ENCOUNTER — Other Ambulatory Visit: Payer: Self-pay | Admitting: *Deleted

## 2018-10-22 NOTE — Telephone Encounter (Signed)
Pt is calling about inhaler. Christen Bame, CMA

## 2018-10-22 NOTE — Telephone Encounter (Signed)
Pharmacy also requesting proventil inhaler which is not on her med list. Please advise. Deseree Kennon Holter, CMA

## 2018-10-23 ENCOUNTER — Other Ambulatory Visit: Payer: Self-pay | Admitting: Family Medicine

## 2018-10-23 MED ORDER — ALBUTEROL SULFATE (2.5 MG/3ML) 0.083% IN NEBU: 2.5000 mg | INHALATION_SOLUTION | Freq: Four times a day (QID) | RESPIRATORY_TRACT | 0 refills | Status: DC | PRN

## 2018-10-23 MED ORDER — ALBUTEROL SULFATE HFA 108 (90 BASE) MCG/ACT IN AERS: 2.0000 | INHALATION_SPRAY | RESPIRATORY_TRACT | 0 refills | Status: DC | PRN

## 2018-10-23 NOTE — Progress Notes (Signed)
Patient requesting refill on albuterol inhaler that was started at the ED. Doing one time refill and will have her evaluated at her next scheduled appointment.

## 2018-10-24 ENCOUNTER — Other Ambulatory Visit: Payer: Self-pay | Admitting: Family Medicine

## 2018-10-24 ENCOUNTER — Telehealth: Payer: Self-pay

## 2018-10-24 MED ORDER — ALBUTEROL SULFATE HFA 108 (90 BASE) MCG/ACT IN AERS: 2.0000 | INHALATION_SPRAY | Freq: Four times a day (QID) | RESPIRATORY_TRACT | 2 refills | Status: DC | PRN

## 2018-10-24 MED ORDER — ALBUTEROL SULFATE HFA 108 (90 BASE) MCG/ACT IN AERS: 2.0000 | INHALATION_SPRAY | RESPIRATORY_TRACT | 2 refills | Status: DC | PRN

## 2018-10-24 NOTE — Telephone Encounter (Signed)
Received fax from pharmacy stating inhaler that was sent in recently is not covered. Please send in Proair instead, per pharmacy.

## 2018-10-24 NOTE — Progress Notes (Signed)
Proair covered by patient insurance

## 2018-11-10 ENCOUNTER — Other Ambulatory Visit: Payer: Self-pay | Admitting: Family Medicine

## 2018-11-10 DIAGNOSIS — E118 Type 2 diabetes mellitus with unspecified complications: Secondary | ICD-10-CM

## 2018-12-19 ENCOUNTER — Telehealth: Payer: Self-pay

## 2018-12-19 NOTE — Telephone Encounter (Signed)
Can we send in a prescription for Proair? Received fax that patients insurance will not cover Ventolin. Please advise.

## 2018-12-24 ENCOUNTER — Other Ambulatory Visit: Payer: Self-pay | Admitting: Family Medicine

## 2018-12-24 MED ORDER — ALBUTEROL SULFATE HFA 108 (90 BASE) MCG/ACT IN AERS: 2.0000 | INHALATION_SPRAY | Freq: Four times a day (QID) | RESPIRATORY_TRACT | 2 refills | Status: DC | PRN

## 2018-12-24 NOTE — Progress Notes (Signed)
proair covered by insurance so changing prescription accordingly.

## 2018-12-31 ENCOUNTER — Ambulatory Visit (INDEPENDENT_AMBULATORY_CARE_PROVIDER_SITE_OTHER): Payer: Medicare Other | Admitting: Family Medicine

## 2018-12-31 ENCOUNTER — Other Ambulatory Visit: Payer: Self-pay

## 2018-12-31 VITALS — BP 130/85 | HR 101 | Wt 210.0 lb

## 2018-12-31 DIAGNOSIS — E118 Type 2 diabetes mellitus with unspecified complications: Secondary | ICD-10-CM | POA: Diagnosis not present

## 2018-12-31 DIAGNOSIS — E119 Type 2 diabetes mellitus without complications: Secondary | ICD-10-CM | POA: Diagnosis not present

## 2018-12-31 LAB — POCT GLYCOSYLATED HEMOGLOBIN (HGB A1C): HbA1c, POC (controlled diabetic range): 6.5 % (ref 0.0–7.0)

## 2018-12-31 MED ORDER — POLYETHYLENE GLYCOL 3350 17 GM/SCOOP PO POWD: 17.0000 g | Freq: Every day | ORAL | 1 refills | Status: DC

## 2018-12-31 MED ORDER — ALBUTEROL SULFATE HFA 108 (90 BASE) MCG/ACT IN AERS: 2.0000 | INHALATION_SPRAY | Freq: Four times a day (QID) | RESPIRATORY_TRACT | 2 refills | Status: DC | PRN

## 2018-12-31 NOTE — Progress Notes (Signed)
     Subjective: Chief Complaint  Patient presents with  . Diabetes     HPI: Jenna Wong is a 68 y.o. presenting to clinic today to discuss the following:  T2DM Patient presents today for follow up for the diabetes and for management. She has no complaints and is taking her medication as directed. She reports no side effects from her medication and has attempted to continue to eat well and keep a balanced diet. She also is walking for 30 minutes per day about 4-5 times per week. She desires to have more weight loss. Advised her to increase her physical activity and monitor her diet more closely to limit foods high in fats and carbs.     ROS noted in HPI.   Past Medical, Surgical, Social, and Family History Reviewed & Updated per EMR.   Pertinent Historical Findings include:   Social History   Tobacco Use  Smoking Status Current Every Day Smoker  . Packs/day: 1.00  . Years: 30.00  . Pack years: 30.00  . Types: Cigarettes  Smokeless Tobacco Never Used  Tobacco Comment   thinking about it    Objective: BP 130/85   Pulse (!) 101   Wt 210 lb (95.3 kg)   SpO2 99%   BMI 32.41 kg/m  Vitals and nursing notes reviewed  Physical Exam Gen: Alert and Oriented x 3, NAD HEENT: Normocephalic, atraumatic CV: RRR, no murmurs, normal S1, S2 split Resp: CTAB, no wheezing, rales, or rhonchi, comfortable work of breathing Abd: non-distended, non-tender, soft, +bs in all four quadrants MSK: See quality metrics section for diabetic foot exam Ext: no clubbing, cyanosis, or edema Skin: warm, dry, intact, no rashes  LABS A1c: 6.5 Microablumin/Creatnine: 12  Assessment/Plan:  Diabetes (HCC) Well controlled on current medication. No changes today as A1c is 6.5% - Cont Metformin 2000mg  daily - Cont daily exercise and try to increase to 45 min 5 times per week for more weight loss - Cont well balanced diet avoiding sugar and foods high in carbs and fat   PATIENT EDUCATION  PROVIDED: See AVS    Diagnosis and plan along with any newly prescribed medication(s) were discussed in detail with this patient today. The patient verbalized understanding and agreed with the plan. Patient advised if symptoms worsen return to clinic or ER.   Health Maintainance: Microalbumin/Creatnine ratio   Orders Placed This Encounter  Procedures  . HgB A1c     Harolyn Rutherford, DO 12/31/2018, 9:26 AM PGY-3 [place]

## 2018-12-31 NOTE — Patient Instructions (Addendum)
It was great to see you today! Thank you for letting me participate in your care!  Today, we discussed your diabetes management. I will call you with any concerning lab results. Otherwise, continue to stay active and walk 1 hour per day as we discussed and to eat low carb and low sugar foods.  Please call me if you need anything!  Be well, Harolyn Rutherford, DO PGY-3, [facility]

## 2019-01-01 LAB — MICROALBUMIN / CREATININE URINE RATIO
Creatinine, Urine: 116.7 mg/dL
Microalb/Creat Ratio: 12 mg/g creat (ref 0–29)
Microalbumin, Urine: 13.5 ug/mL

## 2019-01-05 NOTE — Assessment & Plan Note (Signed)
Well controlled on current medication. No changes today as A1c is 6.5% - Cont Metformin 2000mg  daily - Cont daily exercise and try to increase to 45 min 5 times per week for more weight loss - Cont well balanced diet avoiding sugar and foods high in carbs and fat

## 2019-01-16 DIAGNOSIS — Z7189 Other specified counseling: Secondary | ICD-10-CM | POA: Diagnosis not present

## 2019-01-16 DIAGNOSIS — Z03818 Encounter for observation for suspected exposure to other biological agents ruled out: Secondary | ICD-10-CM | POA: Diagnosis not present

## 2019-01-28 ENCOUNTER — Other Ambulatory Visit: Payer: Self-pay

## 2019-01-28 DIAGNOSIS — Z20822 Contact with and (suspected) exposure to covid-19: Secondary | ICD-10-CM

## 2019-01-29 LAB — NOVEL CORONAVIRUS, NAA: SARS-CoV-2, NAA: NOT DETECTED

## 2019-02-26 ENCOUNTER — Other Ambulatory Visit: Payer: Self-pay | Admitting: Family Medicine

## 2019-04-20 ENCOUNTER — Other Ambulatory Visit: Payer: Self-pay | Admitting: Family Medicine

## 2019-04-20 DIAGNOSIS — Z1231 Encounter for screening mammogram for malignant neoplasm of breast: Secondary | ICD-10-CM

## 2019-04-20 DIAGNOSIS — E118 Type 2 diabetes mellitus with unspecified complications: Secondary | ICD-10-CM

## 2019-04-21 DIAGNOSIS — Z7189 Other specified counseling: Secondary | ICD-10-CM | POA: Diagnosis not present

## 2019-04-24 ENCOUNTER — Ambulatory Visit: Payer: Medicare Other | Admitting: Family Medicine

## 2019-05-05 ENCOUNTER — Other Ambulatory Visit: Payer: Self-pay | Admitting: Family Medicine

## 2019-05-05 DIAGNOSIS — E118 Type 2 diabetes mellitus with unspecified complications: Secondary | ICD-10-CM

## 2019-05-25 ENCOUNTER — Telehealth: Payer: Self-pay | Admitting: Family Medicine

## 2019-05-25 NOTE — Telephone Encounter (Signed)
Sister is dropping off forms to be filled out for her sister so that she can qualify or see if she qualify's for Medicaid and other benefits. jw

## 2019-05-25 NOTE — Telephone Encounter (Signed)
Reviewed form and placed in PCP's box for completion.  .Jessilynn Taft R Ajanee Buren, CMA  

## 2019-05-27 NOTE — Telephone Encounter (Signed)
Pt wasn't home, left a message for her to call us back. Jenna Wong, CMA

## 2019-06-09 ENCOUNTER — Other Ambulatory Visit: Payer: Self-pay

## 2019-06-09 ENCOUNTER — Ambulatory Visit (INDEPENDENT_AMBULATORY_CARE_PROVIDER_SITE_OTHER): Payer: Medicare Other | Admitting: Family Medicine

## 2019-06-09 VITALS — BP 110/58 | HR 94 | Wt 208.0 lb

## 2019-06-09 DIAGNOSIS — E1169 Type 2 diabetes mellitus with other specified complication: Secondary | ICD-10-CM

## 2019-06-09 DIAGNOSIS — E785 Hyperlipidemia, unspecified: Secondary | ICD-10-CM

## 2019-06-09 DIAGNOSIS — R42 Dizziness and giddiness: Secondary | ICD-10-CM | POA: Diagnosis not present

## 2019-06-09 LAB — POCT GLYCOSYLATED HEMOGLOBIN (HGB A1C): Hemoglobin A1C: 6.3 % — AB (ref 4.0–5.6)

## 2019-06-09 MED ORDER — MECLIZINE HCL 25 MG PO TABS: ORAL_TABLET | ORAL | 0 refills | Status: DC

## 2019-06-09 MED ORDER — ONDANSETRON HCL 4 MG PO TABS: 4.0000 mg | ORAL_TABLET | Freq: Three times a day (TID) | ORAL | 0 refills | Status: DC | PRN

## 2019-06-09 NOTE — Assessment & Plan Note (Signed)
Chronic issue, patient has had fairly extensive work-up without signs of stroke or other neurologic or electrolyte derangement.  Patient has been without her medications for several months.  Recommend restarting her meclizine and Zofran.  Patient follow-up in 2 weeks if no improvement, likely would need repeat head imaging and possibly referral to neurology.

## 2019-06-09 NOTE — Progress Notes (Signed)
Subjective:  Jenna Wong is a 68 y.o. female who presents to the [facility] today with a chief complaint of diabetes follow-up and chronic dizziness.   HPI:  Diabetes mellitus type 2 Patient here for diabetes follow-up.  A1c today is 6.3.  Patient is currently taking Metformin 1000 mg twice daily.  She is on moderate intensity statin.  She says her fasting a.m. CBGs typically run between 180 and 200.  She denies any polyuria or polydipsia.  Patient's blood pressure is at goal.  Patient does need referral to ophthalmology.  Chronic dizziness Patient reports that she has had worsening dizziness for the past 4 months.  This is a chronic issue patient has had for a few years.  She typically takes meclizine and Zofran.  She has been without these for the past 4 months.  Patient reports feeling "drunk" when walking.  Denies any blurry vision or hearing loss or headache, trouble breathing, trouble speaking, confusion, difficulty swallowing.  Patient has had an extensive work-up for this in the past including head imaging in 2017 that was normal, EKG without gross arrhythmia, normal B12, normal TSH.  Patient has been to vestibular rehab in the past, however says she cannot afford it.   ROS: Per HPI  PMH: Smoking history reviewed.    Objective:  Physical Exam: BP (!) 110/58   Pulse 94   Wt 208 lb (94.3 kg)   SpO2 97%   BMI 32.10 kg/m   Gen: NAD, resting comfortably HEENT: PERRLA, EOMI, no noted nystagmus CV: RRR with no murmurs appreciated Pulm: NWOB, CTAB with no crackles, wheezes, or rhonchi GI: Soft, Nontender, Nondistended. MSK: no edema, cyanosis, or clubbing noted Skin: warm, dry Neuro: Cranial nerves II to XII grossly intact grossly normal, no dysmetria, no dysarthria, moves all extremities, able to ambulate without assistance Psych: Normal affect and thought content  Results for orders placed or performed in visit on 06/09/19 (from the past 72 hour(s))  HgB A1c     Status:  Abnormal   Collection Time: 06/09/19  8:40 AM  Result Value Ref Range   Hemoglobin A1C 6.3 (A) 4.0 - 5.6 %   HbA1c POC (<> result, manual entry)     HbA1c, POC (prediabetic range)     HbA1c, POC (controlled diabetic range)       Assessment/Plan:  Diabetes (HCC) A1c still elevated over last, but still within goal.  Continue to monitor follow-up in 6 months.  Will refer to ophthalmology for diabetic eye exam.  Dizziness Chronic issue, patient has had fairly extensive work-up without signs of stroke or other neurologic or electrolyte derangement.  Patient has been without her medications for several months.  Recommend restarting her meclizine and Zofran.  Patient follow-up in 2 weeks if no improvement, likely would need repeat head imaging and possibly referral to neurology.  We will check lipid panel.  Precepted with Dr. Juleen China.   Lab Orders     Lipid Panel     HgB A1c  Meds ordered this encounter  Medications  . meclizine (ANTIVERT) 25 MG tablet    Sig: TAKE 1 TABLET BY MOUTH THREE TIMES DAILY AS NEEDED FOR DIZZINESS    Dispense:  30 tablet    Refill:  0  . ondansetron (ZOFRAN) 4 MG tablet    Sig: Take 1 tablet (4 mg total) by mouth every 8 (eight) hours as needed for nausea.    Dispense:  10 tablet    Refill:  0  Marny Lowenstein, MD, MS FAMILY MEDICINE RESIDENT - PGY3 06/09/2019 10:20 AM

## 2019-06-09 NOTE — Assessment & Plan Note (Signed)
A1c still elevated over last, but still within goal.  Continue to monitor follow-up in 6 months.  Will refer to ophthalmology for diabetic eye exam.

## 2019-06-09 NOTE — Patient Instructions (Addendum)
It was a pleasure to see you today! Thank you for choosing [facility] for your primary care. Jenna Wong was seen for diabetes check and dizziness.   Your Hemoglobin A1C is slighltly elevated over last time, but is still in a good range. Please come back in 6 months for a recheck.   Regarding your dizziness, we have refilled your medication. Please come back in 2 weeks, if no improvement.   Please get your lipid panel checked today.   Best,  Marny Lowenstein, MD, MS FAMILY MEDICINE RESIDENT - PGY3 06/09/2019 8:55 AM

## 2019-06-10 ENCOUNTER — Ambulatory Visit
Admission: RE | Admit: 2019-06-10 | Discharge: 2019-06-10 | Disposition: A | Discharge status: Home / Self Care | Payer: Medicare Other | Source: Ambulatory Visit | Attending: *Deleted | Admitting: *Deleted

## 2019-06-10 DIAGNOSIS — Z1231 Encounter for screening mammogram for malignant neoplasm of breast: Secondary | ICD-10-CM | POA: Diagnosis not present

## 2019-06-10 LAB — LIPID PANEL
Chol/HDL Ratio: 2.7 ratio (ref 0.0–4.4)
Cholesterol, Total: 118 mg/dL (ref 100–199)
HDL: 43 mg/dL (ref >39)
LDL Chol Calc (NIH): 53 mg/dL (ref 0–99)
Triglycerides: 120 mg/dL (ref 0–149)
VLDL Cholesterol Cal: 22 mg/dL (ref 5–40)

## 2019-06-21 ENCOUNTER — Other Ambulatory Visit: Payer: Self-pay | Admitting: Family Medicine

## 2019-06-21 DIAGNOSIS — E118 Type 2 diabetes mellitus with unspecified complications: Secondary | ICD-10-CM

## 2019-06-30 DIAGNOSIS — H2513 Age-related nuclear cataract, bilateral: Secondary | ICD-10-CM | POA: Diagnosis not present

## 2019-06-30 DIAGNOSIS — H35033 Hypertensive retinopathy, bilateral: Secondary | ICD-10-CM | POA: Diagnosis not present

## 2019-06-30 DIAGNOSIS — E119 Type 2 diabetes mellitus without complications: Secondary | ICD-10-CM | POA: Diagnosis not present

## 2019-06-30 DIAGNOSIS — H04213 Epiphora due to excess lacrimation, bilateral lacrimal glands: Secondary | ICD-10-CM | POA: Diagnosis not present

## 2019-06-30 DIAGNOSIS — H524 Presbyopia: Secondary | ICD-10-CM | POA: Diagnosis not present

## 2019-07-03 ENCOUNTER — Other Ambulatory Visit: Payer: Self-pay | Admitting: Family Medicine

## 2019-07-03 DIAGNOSIS — E118 Type 2 diabetes mellitus with unspecified complications: Secondary | ICD-10-CM

## 2019-07-20 ENCOUNTER — Other Ambulatory Visit: Payer: Self-pay | Admitting: Family Medicine

## 2019-07-21 DIAGNOSIS — Z1159 Encounter for screening for other viral diseases: Secondary | ICD-10-CM | POA: Diagnosis not present

## 2019-08-11 ENCOUNTER — Other Ambulatory Visit: Payer: Self-pay | Admitting: Family Medicine

## 2019-08-11 DIAGNOSIS — E118 Type 2 diabetes mellitus with unspecified complications: Secondary | ICD-10-CM

## 2019-09-18 ENCOUNTER — Ambulatory Visit (INDEPENDENT_AMBULATORY_CARE_PROVIDER_SITE_OTHER): Payer: Medicare Other | Admitting: Family Medicine

## 2019-09-18 ENCOUNTER — Other Ambulatory Visit: Payer: Self-pay

## 2019-09-18 ENCOUNTER — Encounter: Payer: Self-pay | Admitting: Family Medicine

## 2019-09-18 VITALS — BP 124/62 | HR 90 | Ht 68.0 in | Wt 211.0 lb

## 2019-09-18 DIAGNOSIS — E119 Type 2 diabetes mellitus without complications: Secondary | ICD-10-CM

## 2019-09-18 DIAGNOSIS — Z780 Asymptomatic menopausal state: Secondary | ICD-10-CM | POA: Diagnosis not present

## 2019-09-18 DIAGNOSIS — Z0000 Encounter for general adult medical examination without abnormal findings: Secondary | ICD-10-CM

## 2019-09-18 DIAGNOSIS — Z1159 Encounter for screening for other viral diseases: Secondary | ICD-10-CM

## 2019-09-18 DIAGNOSIS — R42 Dizziness and giddiness: Secondary | ICD-10-CM

## 2019-09-18 LAB — POCT GLYCOSYLATED HEMOGLOBIN (HGB A1C): HbA1c, POC (controlled diabetic range): 6.7 % (ref 0.0–7.0)

## 2019-09-18 MED ORDER — MECLIZINE HCL 25 MG PO TABS: ORAL_TABLET | ORAL | 0 refills | Status: DC

## 2019-09-18 NOTE — Progress Notes (Signed)
    SUBJECTIVE:   CHIEF COMPLAINT / HPI:   T2DM [name] presents today due to concern for her diabetes. She has been checking her blood sugar at home and says recently in the past two weeks she has had readings in the 180-200 range. Normally, she is below this. This morning it was 157. She states she is not having any urinary frequency, urgency, no polydypsia, no syncope, near syncope, dizziness, or falls. She is just concerned her blood sugar has been too high and is requesting and A1c today. It has been over 3 months since last check so reasonable to do so today.  Healthcare Maintenance Hep C and Dexa scan ordered  PERTINENT  PMH / PSH: T2DM, HTN  OBJECTIVE:   BP 124/62   Pulse 90   Ht 5\' 8"  (1.727 m)   Wt 211 lb (95.7 kg)   SpO2 98%   BMI 32.08 kg/m   Gen: NAD CV: RRR, no murmurs, normal S1, S2 split Resp: CTAB, no wheezing, rales, or rhonchi, comfortable work of breathing Skin: warm, dry, intact, no rashes  ASSESSMENT/PLAN:   Diabetes (HCC) Well controlled on Metformin with A1c of 6.7%. Discussed with patient if her numbers continue to be consistently over 200 to please return to the clinic however she has been well controlled on current regimen in the past 3 months. - Recheck A1c in 3 months - cont Metformin 1000mg  BID     Jenna Alpha, DO [place]

## 2019-09-18 NOTE — Patient Instructions (Addendum)
It was great to see you today! Thank you for letting me participate in your care!  Today, we discussed I am glad you came in. Your A1c at 6/7% shows your diabetes is still well controlled. Please continue checking your blood sugar once per day in the mornings before meals. If you notice numbers consistently above 200 for several weeks please come in to the clinic. Otherwise, we will recheck in 6 months. Continue taking Metformin as prescribed.  Be well, Harolyn Rutherford, DO PGY-3, [facility]

## 2019-09-19 LAB — HEPATITIS C ANTIBODY: Hep C Virus Ab: <0.1 s/co ratio (ref 0.0–0.9)

## 2019-09-21 NOTE — Assessment & Plan Note (Signed)
Well controlled on Metformin with A1c of 6.7%. Discussed with patient if her numbers continue to be consistently over 200 to please return to the clinic however she has been well controlled on current regimen in the past 3 months. - Recheck A1c in 3 months - cont Metformin 1000mg  BID

## 2019-10-06 ENCOUNTER — Other Ambulatory Visit: Payer: Self-pay | Admitting: Family Medicine

## 2019-10-22 ENCOUNTER — Other Ambulatory Visit: Payer: Self-pay

## 2019-10-22 ENCOUNTER — Ambulatory Visit (INDEPENDENT_AMBULATORY_CARE_PROVIDER_SITE_OTHER): Payer: Medicare Other | Admitting: Family Medicine

## 2019-10-22 ENCOUNTER — Encounter: Payer: Self-pay | Admitting: Family Medicine

## 2019-10-22 VITALS — BP 120/72 | HR 95 | Ht 68.0 in | Wt 207.8 lb

## 2019-10-22 DIAGNOSIS — E119 Type 2 diabetes mellitus without complications: Secondary | ICD-10-CM | POA: Diagnosis not present

## 2019-10-22 DIAGNOSIS — R1310 Dysphagia, unspecified: Secondary | ICD-10-CM | POA: Diagnosis not present

## 2019-10-22 DIAGNOSIS — I889 Nonspecific lymphadenitis, unspecified: Secondary | ICD-10-CM | POA: Diagnosis not present

## 2019-10-22 LAB — POCT GLYCOSYLATED HEMOGLOBIN (HGB A1C): HbA1c, POC (controlled diabetic range): 6.4 % (ref 0.0–7.0)

## 2019-10-22 MED ORDER — FLUTICASONE PROPIONATE 50 MCG/ACT NA SUSP: 1.0000 | Freq: Every day | NASAL | 12 refills | Status: DC

## 2019-10-22 MED ORDER — LORATADINE 10 MG PO TBDP: 10.0000 mg | ORAL_TABLET | Freq: Every day | ORAL | 12 refills | Status: DC

## 2019-10-22 NOTE — Progress Notes (Signed)
    SUBJECTIVE:   CHIEF COMPLAINT / HPI:   Difficulty swallowing and sore throat Patient is a 68y/o female who presents with one week of difficulty swallowing and a sore throat. She has not had this before. She states she has no other symptoms such as fever, chills, congestion, cough, or runny nose, and no cough. She does endorse some intermittent watery eyes with possible itchy. She is not having any difficulty breathing and the soreness is only when she swallows. She states liquids are more difficult to swallow than solids but she is having problems with both. She is still able to eat and drink but feels like "something is going to get stuck" when she does eat and drink. The pain is mostly on the left side of her throat and neck. Not getting better with OTC medications, warm salt water, or throat lozenges.  PERTINENT  PMH / PSH: Current tobacco use, HTN, T2DM, Seasonal Allergies  OBJECTIVE:   BP 120/72   Pulse 95   Ht 5\' 8"  (1.727 m)   Wt 207 lb 12.8 oz (94.3 kg)   SpO2 97%   BMI 31.60 kg/m   Gen: Alert and Oriented x 3, NAD HEENT: Normocephalic, atraumatic, PERRLA, EOMI, non-swollen, non-erythematous turbinates,  uvula midline, no swelling in the pharnyx, no sign of abscess, non-erythematous pharyngeal mucosa, no exudates Neck: trachea midline, no thyroidmegaly, single enlarged papable lymph node located just inferior to the left mandible about in the middle of the mandibular bone. Non-tender, non-mobile. CV: RRR, no murmurs, normal S1, S2 split Resp: CTAB, no wheezing, rales, or rhonchi, comfortable work of breathing Skin: warm, dry, intact, no rashes  ASSESSMENT/PLAN:   Dysphagia Given tobacco use, single swollen lymph node, and difficulty swallowing I am concerned about possible cancerous etiology. She does have allergies but would unlikely be the cause of dysphagia. However, she has some allergy symptoms and it can contribute to sore throat via drainage so will treat. - Head and  neck soft tissue CT scan to rule out obstructing mass/lesion - If negative and symptoms continue, will refer to GI for EDG/swallow study - Since she does have history of allergies I will restart her loratadine and Flonase.      Nuala Alpha, [place]

## 2019-10-22 NOTE — Patient Instructions (Addendum)
It was great to see you today! Thank you for letting me participate in your care!  Today, we discussed your difficulty swallowing and the fact you have a swollen lymph node. I am ordering a CT scan of your neck to see if it can help [place] find a cause. If this does not help and your symptoms do not resolve I will order more tests at your follow up appointment.  Some of your symptoms could be due to allergies. If have started you on two medications for your allergies. Please take them as directed.  Be well, Harolyn Rutherford, DO PGY-3, [facility]

## 2019-10-23 DIAGNOSIS — R1310 Dysphagia, unspecified: Secondary | ICD-10-CM

## 2019-10-23 HISTORY — DX: Dysphagia, unspecified: R13.10

## 2019-10-23 NOTE — Assessment & Plan Note (Signed)
Given tobacco use, single swollen lymph node, and difficulty swallowing I am concerned about possible cancerous etiology. She does have allergies but would unlikely be the cause of dysphagia. However, she has some allergy symptoms and it can contribute to sore throat via drainage so will treat. - Head and neck soft tissue CT scan to rule out obstructing mass/lesion - If negative and symptoms continue, will refer to GI for EDG/swallow study - Since she does have history of allergies I will restart her loratadine and Flonase.

## 2019-10-26 ENCOUNTER — Other Ambulatory Visit: Payer: Self-pay | Admitting: Family Medicine

## 2019-10-26 DIAGNOSIS — E119 Type 2 diabetes mellitus without complications: Secondary | ICD-10-CM

## 2019-10-26 NOTE — Progress Notes (Signed)
bmp 

## 2019-10-28 ENCOUNTER — Other Ambulatory Visit: Payer: Self-pay

## 2019-10-28 ENCOUNTER — Ambulatory Visit (HOSPITAL_COMMUNITY)
Admission: RE | Admit: 2019-10-28 | Discharge: 2019-10-28 | Disposition: A | Discharge status: Home / Self Care | Payer: Medicare Other | Source: Ambulatory Visit | Attending: Family Medicine | Admitting: Family Medicine

## 2019-10-28 ENCOUNTER — Other Ambulatory Visit: Payer: Self-pay | Admitting: Family Medicine

## 2019-10-28 DIAGNOSIS — I10 Essential (primary) hypertension: Secondary | ICD-10-CM

## 2019-10-28 DIAGNOSIS — I889 Nonspecific lymphadenitis, unspecified: Secondary | ICD-10-CM

## 2019-10-28 LAB — POCT I-STAT CREATININE: Creatinine, Ser: 0.9 mg/dL (ref 0.44–1.00)

## 2019-10-28 MED ORDER — IOHEXOL 300 MG/ML  SOLN
75.0000 mL | Freq: Once | INTRAMUSCULAR | Status: AC | PRN
  Administered 2019-10-28: 75 mL via INTRAVENOUS

## 2019-10-28 NOTE — Progress Notes (Signed)
Put in istat order so patient can get imaging study

## 2019-10-29 ENCOUNTER — Ambulatory Visit (INDEPENDENT_AMBULATORY_CARE_PROVIDER_SITE_OTHER): Payer: Medicare Other | Admitting: Family Medicine

## 2019-10-29 VITALS — BP 130/82 | HR 98 | Ht 67.5 in | Wt 208.0 lb

## 2019-10-29 DIAGNOSIS — E041 Nontoxic single thyroid nodule: Secondary | ICD-10-CM | POA: Insufficient documentation

## 2019-10-29 NOTE — Assessment & Plan Note (Signed)
Head CT showed no abnormal lymph nodes and on palpation and inspection today the lymph node located on the left just under the mandible is no longer palpable. Discussed with patient CT of her neck did show thyroid nodules and evidence of compression of her esophagus.  - Referral to General Surgery for possible thyroid surgery given esophageal compression - TSH, T3 and T4 today - Thyroid U/S ordered today and scheduled for 11/04/2019. Patient informed.

## 2019-10-29 NOTE — Patient Instructions (Addendum)
It was great to see you today! Thank you for letting me participate in your care!  Today, we discussed the results of your CT scan and I am glad it did not show anything involving the lymph nodes. However, it did show some thyroid nodules that need further investigation. I have ordered a thyroid ultrasound which will be done at [facility]. I have also ordered some labs. I will call you with results when I have them. I have also referred you to a general surgeon who specializes in thyroid surgery. They may recommend removing a part of all of your thyroid since it is compressing your esophagus.  If you have increased difficulty swallowing, please come back to see me. If you develop difficulty breathing please seek emergency care immediately.  Be well, Harolyn Rutherford, DO PGY-3, [facility]

## 2019-10-29 NOTE — Progress Notes (Signed)
    SUBJECTIVE:   CHIEF COMPLAINT / HPI:   Follow up for dysphagia Patient returns to clinic after having CT of her neck done that found multiple thyroid nodules and evidence of thyroid compression on the esophagus. She is still having the sensation that food and liquids will get stuck when she swallows but she is still able to eat. I explained her CT results to her and discussed with the patient next step to get a thyroid ultrasound and to get some labs which we will do today.  No difficulty breathing. No temperature intolerance, no weight loss or gain, no hair loss, no night sweats, no fever, chills, no excessive sweating, no diarrhea. Patient has history of constipation which is unchanged.  PERTINENT  PMH / PSH: T2DM, HTN  OBJECTIVE:   BP 130/82   Pulse 98   Ht 5' 7.5" (1.715 m)   Wt 208 lb (94.3 kg)   SpO2 96%   BMI 32.10 kg/m   Gen: NAD Neck: No palpable lymph nodes today, thyroid mass on left side, non-tender. Trachea midline  ASSESSMENT/PLAN:   Thyroid nodule Head CT showed no abnormal lymph nodes and on palpation and inspection today the lymph node located on the left just under the mandible is no longer palpable. Discussed with patient CT of her neck did show thyroid nodules and evidence of compression of her esophagus.  - Referral to General Surgery for possible thyroid surgery given esophageal compression - TSH, T3 and T4 today - Thyroid U/S ordered today and scheduled for 11/04/2019. Patient informed.     Nuala Alpha, [place]

## 2019-10-30 LAB — TSH+FREE T4
Free T4: 0.9 ng/dL (ref 0.82–1.77)
TSH: 1.5 u[IU]/mL (ref 0.450–4.500)

## 2019-10-30 LAB — T3, FREE: T3, Free: 2.5 pg/mL (ref 2.0–4.4)

## 2019-11-04 ENCOUNTER — Ambulatory Visit (HOSPITAL_COMMUNITY)
Admission: RE | Admit: 2019-11-04 | Discharge: 2019-11-04 | Disposition: A | Discharge status: Home / Self Care | Payer: Medicare Other | Source: Ambulatory Visit | Attending: Family Medicine | Admitting: Family Medicine

## 2019-11-04 ENCOUNTER — Other Ambulatory Visit: Payer: Self-pay

## 2019-11-04 DIAGNOSIS — E041 Nontoxic single thyroid nodule: Secondary | ICD-10-CM | POA: Insufficient documentation

## 2019-11-04 DIAGNOSIS — E042 Nontoxic multinodular goiter: Secondary | ICD-10-CM | POA: Diagnosis not present

## 2019-11-05 ENCOUNTER — Ambulatory Visit: Payer: Medicare Other | Admitting: Family Medicine

## 2019-11-06 ENCOUNTER — Other Ambulatory Visit: Payer: Self-pay | Admitting: Family Medicine

## 2019-11-06 ENCOUNTER — Telehealth: Payer: Self-pay

## 2019-11-06 DIAGNOSIS — E118 Type 2 diabetes mellitus with unspecified complications: Secondary | ICD-10-CM

## 2019-11-06 NOTE — Telephone Encounter (Signed)
Patient calls nurse line regarding thyroid US results. Patient states that she missed appointment yesterday and needs to know how she needs to proceed.   To PCP for next steps  Talbot Grumbling, RN

## 2019-11-09 ENCOUNTER — Other Ambulatory Visit: Payer: Self-pay | Admitting: Family Medicine

## 2019-11-09 DIAGNOSIS — Z03818 Encounter for observation for suspected exposure to other biological agents ruled out: Secondary | ICD-10-CM | POA: Diagnosis not present

## 2019-11-09 DIAGNOSIS — E041 Nontoxic single thyroid nodule: Secondary | ICD-10-CM

## 2019-11-09 NOTE — Progress Notes (Signed)
Thyroid U/S showed nodule meeting criteria for biopsy with TI-RADS score of 3.  Patient has appointment with [facility] on 11/25/2019 to discuss surgery as I suspect her enlarged thyroid is causing her swallowing discomfort.

## 2019-11-12 ENCOUNTER — Telehealth: Payer: Self-pay | Admitting: *Deleted

## 2019-11-12 NOTE — Telephone Encounter (Signed)
Rx request for one touch verio in vitro strip. Per pharmacy they want you to send a new rx with clear sig and frequency of testing. [name] Holter, CMA

## 2019-11-13 ENCOUNTER — Other Ambulatory Visit: Payer: Self-pay | Admitting: Family Medicine

## 2019-11-13 DIAGNOSIS — E118 Type 2 diabetes mellitus with unspecified complications: Secondary | ICD-10-CM

## 2019-11-13 MED ORDER — ONETOUCH VERIO VI STRP: 1.0000 | ORAL_STRIP | 12 refills | Status: DC

## 2019-11-13 NOTE — Progress Notes (Signed)
Refill request for diabetic test strips

## 2019-11-24 ENCOUNTER — Other Ambulatory Visit (HOSPITAL_COMMUNITY)
Admission: RE | Admit: 2019-11-24 | Discharge: 2019-11-24 | Disposition: A | Discharge status: Home / Self Care | Payer: Medicare Other | Source: Ambulatory Visit | Attending: Radiology | Admitting: Radiology

## 2019-11-24 ENCOUNTER — Ambulatory Visit
Admission: RE | Admit: 2019-11-24 | Discharge: 2019-11-24 | Disposition: A | Discharge status: Home / Self Care | Payer: Medicare Other | Source: Ambulatory Visit | Attending: Family Medicine | Admitting: Family Medicine

## 2019-11-24 DIAGNOSIS — E041 Nontoxic single thyroid nodule: Secondary | ICD-10-CM

## 2019-11-24 DIAGNOSIS — D34 Benign neoplasm of thyroid gland: Secondary | ICD-10-CM | POA: Insufficient documentation

## 2019-11-25 ENCOUNTER — Ambulatory Visit: Payer: Self-pay | Admitting: Surgery

## 2019-11-25 DIAGNOSIS — R1310 Dysphagia, unspecified: Secondary | ICD-10-CM | POA: Diagnosis not present

## 2019-11-25 DIAGNOSIS — E042 Nontoxic multinodular goiter: Secondary | ICD-10-CM | POA: Diagnosis not present

## 2019-11-25 LAB — CYTOLOGY - NON PAP

## 2019-11-27 ENCOUNTER — Other Ambulatory Visit: Payer: Self-pay | Admitting: Family Medicine

## 2019-11-27 DIAGNOSIS — E118 Type 2 diabetes mellitus with unspecified complications: Secondary | ICD-10-CM

## 2019-12-02 ENCOUNTER — Telehealth: Payer: Self-pay | Admitting: Family Medicine

## 2019-12-02 NOTE — Telephone Encounter (Signed)
Pt want doctor to know that no one has called her regarding her surgery related to removing her thyroid. Her ph # [phone]

## 2019-12-02 NOTE — Telephone Encounter (Signed)
Called and LVM for patient concerning referral to   [place] [address] [place]  Of note: Kennyth Lose has been consulted and will resubmit referral.  .Ozella Almond, CMA

## 2019-12-07 NOTE — Progress Notes (Addendum)
PCP - Nuala Alpha, MD Cardiologist -   PPM/ICD -  Device Orders -  Rep Notified -   Chest x-ray -  EKG -  Stress Test -  ECHO -  Cardiac Cath -   HGA1C 10-22-19 6.4   Sleep Study -  CPAP -   Fasting Blood Sugar -  124-200 Checks Blood Sugar _2____ times a day  Blood Thinner Instructions: Aspirin Instructions: Pt plan to hold on 12-08-19  ERAS Protcol - PRE-SURGERY Ensure or G2-   COVID TEST-  COVID Vaccination x 2 =Pfizer  Anesthesia review:   Patient denies shortness of breath, fever, cough and chest pain at PAT appointment   All instructions explained to the patient, with a verbal understanding of the material. Patient agrees to go over the instructions while at home for a better understanding. Patient also instructed to self quarantine after being tested for COVID-19. The opportunity to ask questions was provided.

## 2019-12-08 ENCOUNTER — Encounter (HOSPITAL_COMMUNITY): Payer: Self-pay

## 2019-12-08 ENCOUNTER — Encounter (HOSPITAL_COMMUNITY)
Admission: RE | Admit: 2019-12-08 | Discharge: 2019-12-08 | Disposition: A | Discharge status: Home / Self Care | Payer: Medicare Other | Source: Ambulatory Visit | Attending: Surgery | Admitting: Surgery

## 2019-12-08 ENCOUNTER — Other Ambulatory Visit: Payer: Self-pay

## 2019-12-08 DIAGNOSIS — Z01818 Encounter for other preprocedural examination: Secondary | ICD-10-CM | POA: Insufficient documentation

## 2019-12-08 DIAGNOSIS — E119 Type 2 diabetes mellitus without complications: Secondary | ICD-10-CM | POA: Insufficient documentation

## 2019-12-08 HISTORY — DX: Type 2 diabetes mellitus without complications: E11.9

## 2019-12-08 LAB — CBC
HCT: 41.7 % (ref 36.0–46.0)
Hemoglobin: 13.8 g/dL (ref 12.0–15.0)
MCH: 31.4 pg (ref 26.0–34.0)
MCHC: 33.1 g/dL (ref 30.0–36.0)
MCV: 94.8 fL (ref 80.0–100.0)
Platelets: 280 10*3/uL (ref 150–400)
RBC: 4.4 MIL/uL (ref 3.87–5.11)
RDW: 13.6 % (ref 11.5–15.5)
WBC: 10.5 10*3/uL (ref 4.0–10.5)
nRBC: 0 % (ref 0.0–0.2)

## 2019-12-08 LAB — BASIC METABOLIC PANEL
Anion gap: 9 (ref 5–15)
BUN: 14 mg/dL (ref 8–23)
CO2: 27 mmol/L (ref 22–32)
Calcium: 9.8 mg/dL (ref 8.9–10.3)
Chloride: 104 mmol/L (ref 98–111)
Creatinine, Ser: 0.81 mg/dL (ref 0.44–1.00)
GFR calc Af Amer: >60 mL/min (ref >60)
GFR calc non Af Amer: >60 mL/min (ref >60)
Glucose, Bld: 108 mg/dL — ABNORMAL HIGH (ref 70–99)
Potassium: 3.9 mmol/L (ref 3.5–5.1)
Sodium: 140 mmol/L (ref 135–145)

## 2019-12-08 LAB — GLUCOSE, CAPILLARY: Glucose-Capillary: 126 mg/dL — ABNORMAL HIGH (ref 70–99)

## 2019-12-08 NOTE — Patient Instructions (Addendum)
DUE TO COVID-19 ONLY ONE VISITOR IS ALLOWED TO COME WITH YOU AND STAY IN THE WAITING ROOM ONLY DURING PRE OP AND PROCEDURE DAY OF SURGERY. THE 1 VISITOR MAY VISIT WITH YOU AFTER SURGERY IN YOUR PRIVATE ROOM DURING VISITING HOURS ONLY!  YOU NEED TO HAVE A COVID 19 TEST ON 12-10-19 @ 1:15 PM, THIS TEST MUST BE DONE BEFORE SURGERY, COME  [name], [place] , [phone].  ([place]) ONCE YOUR COVID TEST IS COMPLETED, PLEASE BEGIN THE QUARANTINE INSTRUCTIONS AS OUTLINED IN YOUR HANDOUT.                Jenna Wong  12/08/2019   Your procedure is scheduled on: 12-14-19   Report to [facility] Main  Entrance    Report to Admitting at 9:30 AM     Call this number if you have problems the morning of surgery [phone]    Remember: Do not eat food or drink liquids :After Midnight.     Take these medicines the morning of surgery with A SIP OF WATER: Atorvastatin (Lipitor), and Sertraline (Zoloft). You may also use your nasal spray.  BRUSH YOUR TEETH MORNING OF SURGERY AND RINSE YOUR MOUTH OUT, NO CHEWING GUM CANDY OR MINTS.   DO NOT TAKE ANY DIABETIC MEDICATIONS DAY OF YOUR SURGERY                               You may not have any metal on your body including hair pins and              piercings    Do not wear jewelry, make-up, lotions, powders or perfumes, deodorant              Do not wear nail polish on your fingernails.  Do not shave  48 hours prior to surgery.              Do not bring valuables to the hospital. [place].  Contacts, dentures or bridgework may not be worn into surgery.  You may bring a small overnight bag.     Special Instructions: N/A              Please read over the following fact sheets you were given: _____________________________________________________________________ How to Manage Your Diabetes Before and After Surgery  Why is it important to control my blood  sugar before and after surgery? . Improving blood sugar levels before and after surgery helps healing and can limit problems. . A way of improving blood sugar control is eating a healthy diet by: o  Eating less sugar and carbohydrates o  Increasing activity/exercise o  Talking with your doctor about reaching your blood sugar goals . High blood sugars (greater than 180 mg/dL) can raise your risk of infections and slow your recovery, so you will need to focus on controlling your diabetes during the weeks before surgery. . Make sure that the doctor who takes care of your diabetes knows about your planned surgery including the date and location.  How do I manage my blood sugar before surgery? . Check your blood sugar at least 4 times a day, starting 2 days before surgery, to make sure that the level is not too high or low. o Check your blood sugar the morning of your surgery  when you wake up and every 2 hours until you get to the Short Stay unit. . If your blood sugar is less than 70 mg/dL, you will need to treat for low blood sugar: o Do not take insulin. o Treat a low blood sugar (less than 70 mg/dL) with  cup of clear juice (cranberry or apple), 4 glucose tablets, OR glucose gel. o Recheck blood sugar in 15 minutes after treatment (to make sure it is greater than 70 mg/dL). If your blood sugar is not greater than 70 mg/dL on recheck, call [phone] for further instructions. . Report your blood sugar to the short stay nurse when you get to Short Stay.  . If you are admitted to the hospital after surgery: o Your blood sugar will be checked by the staff and you will probably be given insulin after surgery (instead of oral diabetes medicines) to make sure you have good blood sugar levels. o The goal for blood sugar control after surgery is 80-180 mg/dL.   WHAT DO I DO ABOUT MY DIABETES MEDICATION?  Marland Kitchen Do not take oral diabetes medicines (pills) the morning of surgery.  . THE DAY BEFORE  SURGERY, take your scheduled Metformin        Reviewed and Endorsed by [facility] Patient Education Committee, August 2015            [facility] - Preparing for Surgery Before surgery, you can play an important role.  Because skin is not sterile, your skin needs to be as free of germs as possible.  You can reduce the number of germs on your skin by washing with CHG (chlorahexidine gluconate) soap before surgery.  CHG is an antiseptic cleaner which kills germs and bonds with the skin to continue killing germs even after washing. Please DO NOT use if you have an allergy to CHG or antibacterial soaps.  If your skin becomes reddened/irritated stop using the CHG and inform your nurse when you arrive at Short Stay. Do not shave (including legs and underarms) for at least 48 hours prior to the first CHG shower.  You may shave your face/neck. Please follow these instructions carefully:  1.  Shower with CHG Soap the night before surgery and the  morning of Surgery.  2.  If you choose to wash your hair, wash your hair first as usual with your  normal  shampoo.  3.  After you shampoo, rinse your hair and body thoroughly to remove the  shampoo.                           4.  Use CHG as you would any other liquid soap.  You can apply chg directly  to the skin and wash                       Gently with a scrungie or clean washcloth.  5.  Apply the CHG Soap to your body ONLY FROM THE NECK DOWN.   Do not use on face/ open                           Wound or open sores. Avoid contact with eyes, ears mouth and genitals (private parts).                       Wash face,  Genitals (private parts) with your normal soap.  6.  Wash thoroughly, paying special attention to the area where your surgery  will be performed.  7.  Thoroughly rinse your body with warm water from the neck down.  8.  DO NOT shower/wash with your normal soap after using and rinsing off  the CHG Soap.                9.  Pat yourself dry  with a clean towel.            10.  Wear clean pajamas.            11.  Place clean sheets on your bed the night of your first shower and do not  sleep with pets. Day of Surgery : Do not apply any lotions/deodorants the morning of surgery.  Please wear clean clothes to the hospital/surgery center.  FAILURE TO FOLLOW THESE INSTRUCTIONS MAY RESULT IN THE CANCELLATION OF YOUR SURGERY PATIENT SIGNATURE_________________________________  NURSE SIGNATURE__________________________________  ________________________________________________________________________

## 2019-12-10 ENCOUNTER — Other Ambulatory Visit (HOSPITAL_COMMUNITY)
Admission: RE | Admit: 2019-12-10 | Discharge: 2019-12-10 | Disposition: A | Discharge status: Home / Self Care | Payer: Medicare Other | Source: Ambulatory Visit | Attending: Surgery | Admitting: Surgery

## 2019-12-10 DIAGNOSIS — Z01812 Encounter for preprocedural laboratory examination: Secondary | ICD-10-CM | POA: Insufficient documentation

## 2019-12-10 DIAGNOSIS — Z20822 Contact with and (suspected) exposure to covid-19: Secondary | ICD-10-CM | POA: Diagnosis not present

## 2019-12-10 LAB — SARS CORONAVIRUS 2 (TAT 6-24 HRS): SARS Coronavirus 2: NEGATIVE

## 2019-12-13 ENCOUNTER — Encounter (HOSPITAL_COMMUNITY): Payer: Self-pay | Admitting: Surgery

## 2019-12-13 DIAGNOSIS — E042 Nontoxic multinodular goiter: Secondary | ICD-10-CM | POA: Diagnosis present

## 2019-12-13 NOTE — H&P (Signed)
[facility]  Jenna Wong DOB: [birth year] Single / Language: Jenna Wong / Race: Black or African American Female   History of Present Illness  The patient is a 69 year old female who presents with a thyroid nodule.  CHIEF COMPLAINT: multiple thyroid nodules, dysphagia  Patient is referred by her primary care physician, Dr. Nuala Alpha, for surgical evaluation and management of multiple thyroid nodules and associated dysphagia. Patient had developed dysphagia. She underwent a CT scan of the neck which demonstrated left-sided thyroid nodules which were impinging upon the esophagus. Patient subsequently underwent an ultrasound examination on Nov 04, 2019. This showed 2 nodules in the left thyroid lobe measuring 3.7 cm and 2.6 cm respectively. Biopsies were recommended. No significant nodularity was noted on the right side. Laboratory studies showed a normal TSH level of 1.50. Patient is not on thyroid medication. She has had no prior head or neck surgery. There is no family history of thyroid disease and specifically no history of thyroid cancer. She presents today accompanied by her sister. Patient is retired. 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 She denies tremors or palpitations. She does note dysphagia for both solids and liquids. She is also noted some change in the quality of her voice over the past several weeks. This is confirmed by her sister.   Past Surgical History  No pertinent past surgical history   Diagnostic Studies History  Colonoscopy  1-5 years ago Mammogram  within last year  Allergies  No Known Drug Allergies   Medication History  Atorvastatin Calcium (40MG  Tablet, Oral) Active. Sertraline HCl (50MG  Tablet, Oral) Active. Aspirin (81MG  Tablet, Oral) Active. metFORMIN HCl (1000MG  Tablet, Oral) Active. Medications Reconciled  Social History Alcohol use  Occasional alcohol  use. No drug use  Tobacco use  Current every day smoker, Current some day smoker.  Family History First Degree Relatives  No pertinent family history   Pregnancy / Birth History  Age at menarche  9 years.  Other Problems Hypercholesterolemia    Review of Systems  General Not Present- Appetite Loss, Chills, Fatigue, Fever, Night Sweats, Weight Gain and Weight Loss. HEENT Not Present- Earache, Hearing Loss, Hoarseness, Nose Bleed, Oral Ulcers, Ringing in the Ears, Seasonal Allergies, Sinus Pain, Sore Throat, Visual Disturbances, Wears glasses/contact lenses and Yellow Eyes. Respiratory Not Present- Bloody sputum, Chronic Cough, Difficulty Breathing, Snoring and Wheezing. Breast Present- Nipple Discharge. Not Present- Breast Mass, Breast Pain and Skin Changes. Cardiovascular Not Present- Chest Pain, Difficulty Breathing Lying Down, Leg Cramps, Palpitations, Rapid Heart Rate, Shortness of Breath and Swelling of Extremities. Gastrointestinal Not Present- Abdominal Pain, Bloating, Bloody Stool, Change in Bowel Habits, Chronic diarrhea, Constipation, Difficulty Swallowing, Excessive gas, Gets full quickly at meals, Hemorrhoids, Indigestion, Nausea, Rectal Pain and Vomiting. Female Genitourinary Not Present- Frequency, Nocturia, Painful Urination, Pelvic Pain and Urgency. Neurological Not Present- Decreased Memory, Fainting, Headaches, Numbness, Seizures, Tingling, Tremor, Trouble walking and Weakness. Psychiatric Present- Depression. Not Present- Anxiety, Bipolar, Change in Sleep Pattern, Fearful and Frequent crying. Endocrine Present- New Diabetes. Not Present- Cold Intolerance, Excessive Hunger, Hair Changes, Heat Intolerance and Hot flashes. Hematology Not Present- Blood Thinners, Easy Bruising, Excessive bleeding, Gland problems, HIV and Persistent Infections.  Vitals Weight: 205.6 lb Height: 67in Body Surface Area: 2.05 m Body Mass Index: 32.2 kg/m  Temp.: 97.39F   Pulse: 73 (Regular)  BP: 122/84(Sitting, Left Arm, Standard)  Physical Exam   GENERAL APPEARANCE Development: normal Nutritional status: normal Gross deformities: none  SKIN  Rash, lesions, ulcers: none Induration, erythema: none Nodules: none palpable  EYES Conjunctiva and lids: normal Pupils: equal and reactive Iris: normal bilaterally  EARS, NOSE, MOUTH, THROAT External ears: no lesion or deformity External nose: no lesion or deformity Hearing: grossly normal Due to Covid-19 pandemic, patient is wearing a mask.  NECK Symmetric: no Trachea: midline Thyroid: Right thyroid lobe is without palpable abnormality. There is a dominant nodule in the left side of the isthmus and extending into the left thyroid lobe. This extends beneath the left clavicle. It is smooth and rounded and relatively firm. It is mobile with swallowing. It is nontender. There is no associated lymphadenopathy. Voice is slightly deep and mildly hoarse and normal conversational level.  CHEST Respiratory effort: normal Retraction or accessory muscle use: no Breath sounds: normal bilaterally Rales, rhonchi, wheeze: none  CARDIOVASCULAR Auscultation: regular rhythm, normal rate Murmurs: none Pulses: radial pulse 2+ palpable Lower extremity edema: none  MUSCULOSKELETAL Station and gait: normal Digits and nails: no clubbing or cyanosis Muscle strength: grossly normal all extremities Range of motion: grossly normal all extremities Deformity: none  LYMPHATIC Cervical: none palpable Supraclavicular: none palpable  PSYCHIATRIC Oriented to person, place, and time: yes Mood and affect: normal for situation Judgment and insight: appropriate for situation    Assessment & Plan   MULTIPLE THYROID 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  Patient provided with a copy of "The Thyroid Book: Medical and Surgical Treatment of Thyroid Problems", published by [employer], 16 pages. Book reviewed and explained to patient during visit today.  Patient presents today accompanied by her sister. We discussed the results of her CT scan and her thyroid ultrasound. Patient has dominant nodules in the left thyroid lobe which appeared to be compressing the left side of her esophagus causing symptoms of dysphagia. Patient did undergo a percutaneous biopsy of the left-sided thyroid nodule yesterday. We are awaiting the results of cytopathology.  Patient will require a left thyroid lobectomy, and in the event of malignancy on her percutaneous biopsy, a total thyroidectomy. We discussed the risk and benefits of each procedure. We discussed the risk of recurrent laryngeal nerve injury and injury to parathyroid glands. We discussed the size and location of her surgical incision. We discussed an overnight Wong stay following her procedure. We discussed the potential need for lifelong thyroid hormone replacement. We discussed the potential need for additional treatment in the event of malignancy, mainly being radioactive iodine treatment. The patient understands and wishes to proceed with surgery in the near future.  The risks and benefits of the procedure have been discussed at length with the patient. The patient understands the proposed procedure, potential alternative treatments, and the course of recovery to be expected. All of the patient's questions have been answered at this time. The patient wishes to proceed with surgery.  ADDENDUM FNA biopsy of left thyroid nodule was benign.  Plan to proceed with left lobectomy.  Armandina Gemma, MD [facility] Office: [phone]

## 2019-12-14 ENCOUNTER — Encounter (HOSPITAL_COMMUNITY)
Admission: RE | Disposition: A | Discharge status: Home / Self Care | Payer: Self-pay | Source: Home / Self Care | Attending: Surgery

## 2019-12-14 ENCOUNTER — Ambulatory Visit (HOSPITAL_COMMUNITY)
Admission: RE | Admit: 2019-12-14 | Discharge: 2019-12-15 | Disposition: A | Discharge status: Home / Self Care | Payer: Medicare Other | Attending: Surgery | Admitting: Surgery

## 2019-12-14 ENCOUNTER — Ambulatory Visit (HOSPITAL_COMMUNITY): Payer: Medicare Other | Admitting: Certified Registered"

## 2019-12-14 ENCOUNTER — Other Ambulatory Visit: Payer: Self-pay

## 2019-12-14 ENCOUNTER — Encounter (HOSPITAL_COMMUNITY): Payer: Self-pay | Admitting: Surgery

## 2019-12-14 DIAGNOSIS — R1310 Dysphagia, unspecified: Secondary | ICD-10-CM

## 2019-12-14 DIAGNOSIS — Z79899 Other long term (current) drug therapy: Secondary | ICD-10-CM | POA: Insufficient documentation

## 2019-12-14 DIAGNOSIS — E7800 Pure hypercholesterolemia, unspecified: Secondary | ICD-10-CM | POA: Insufficient documentation

## 2019-12-14 DIAGNOSIS — E042 Nontoxic multinodular goiter: Secondary | ICD-10-CM | POA: Diagnosis not present

## 2019-12-14 DIAGNOSIS — E049 Nontoxic goiter, unspecified: Secondary | ICD-10-CM | POA: Diagnosis not present

## 2019-12-14 DIAGNOSIS — F17200 Nicotine dependence, unspecified, uncomplicated: Secondary | ICD-10-CM | POA: Insufficient documentation

## 2019-12-14 DIAGNOSIS — Z7984 Long term (current) use of oral hypoglycemic drugs: Secondary | ICD-10-CM | POA: Insufficient documentation

## 2019-12-14 DIAGNOSIS — Z7982 Long term (current) use of aspirin: Secondary | ICD-10-CM | POA: Insufficient documentation

## 2019-12-14 DIAGNOSIS — E119 Type 2 diabetes mellitus without complications: Secondary | ICD-10-CM | POA: Diagnosis not present

## 2019-12-14 DIAGNOSIS — I10 Essential (primary) hypertension: Secondary | ICD-10-CM | POA: Insufficient documentation

## 2019-12-14 HISTORY — PX: THYROID LOBECTOMY: SHX420

## 2019-12-14 LAB — GLUCOSE, CAPILLARY
Glucose-Capillary: 140 mg/dL — ABNORMAL HIGH (ref 70–99)
Glucose-Capillary: 139 mg/dL — ABNORMAL HIGH (ref 70–99)
Glucose-Capillary: 166 mg/dL — ABNORMAL HIGH (ref 70–99)
Glucose-Capillary: 164 mg/dL — ABNORMAL HIGH (ref 70–99)

## 2019-12-14 SURGERY — LOBECTOMY, THYROID
Anesthesia: General | Site: Neck

## 2019-12-14 MED ORDER — AMISULPRIDE (ANTIEMETIC) 5 MG/2ML IV SOLN: 10.0000 mg | Freq: Once | INTRAVENOUS | Status: DC | PRN

## 2019-12-14 MED ORDER — ROCURONIUM BROMIDE 10 MG/ML (PF) SYRINGE
PREFILLED_SYRINGE | INTRAVENOUS | Status: AC
  Filled 2019-12-14: qty 10

## 2019-12-14 MED ORDER — HYDROMORPHONE HCL 1 MG/ML IJ SOLN
0.2500 mg | INTRAMUSCULAR | Status: DC | PRN
  Administered 2019-12-14 (×2): 0.5 mg via INTRAVENOUS

## 2019-12-14 MED ORDER — SUGAMMADEX SODIUM 200 MG/2ML IV SOLN
INTRAVENOUS | Status: DC | PRN
  Administered 2019-12-14: 200 mg via INTRAVENOUS

## 2019-12-14 MED ORDER — LIDOCAINE 2% (20 MG/ML) 5 ML SYRINGE
INTRAMUSCULAR | Status: DC | PRN
  Administered 2019-12-14: 1.5 mg/kg/h via INTRAVENOUS

## 2019-12-14 MED ORDER — MIDAZOLAM HCL 2 MG/2ML IJ SOLN
INTRAMUSCULAR | Status: AC
  Filled 2019-12-14: qty 2

## 2019-12-14 MED ORDER — OXYCODONE HCL 5 MG/5ML PO SOLN: 5.0000 mg | Freq: Once | ORAL | Status: DC | PRN

## 2019-12-14 MED ORDER — ESMOLOL HCL 100 MG/10ML IV SOLN
INTRAVENOUS | Status: AC
  Filled 2019-12-14: qty 10

## 2019-12-14 MED ORDER — HYDROMORPHONE HCL 1 MG/ML IJ SOLN
INTRAMUSCULAR | Status: AC
  Filled 2019-12-14: qty 2

## 2019-12-14 MED ORDER — METFORMIN HCL 500 MG PO TABS
1000.0000 mg | ORAL_TABLET | Freq: Two times a day (BID) | ORAL | Status: DC
  Administered 2019-12-14 – 2019-12-15 (×2): 1000 mg via ORAL
  Filled 2019-12-14 (×2): qty 2

## 2019-12-14 MED ORDER — DEXAMETHASONE SODIUM PHOSPHATE 10 MG/ML IJ SOLN
INTRAMUSCULAR | Status: DC | PRN
  Administered 2019-12-14: 4 mg via INTRAVENOUS

## 2019-12-14 MED ORDER — PROMETHAZINE HCL 25 MG/ML IJ SOLN: 6.2500 mg | INTRAMUSCULAR | Status: DC | PRN

## 2019-12-14 MED ORDER — ONDANSETRON HCL 4 MG/2ML IJ SOLN
INTRAMUSCULAR | Status: DC | PRN
  Administered 2019-12-14: 4 mg via INTRAVENOUS

## 2019-12-14 MED ORDER — FENTANYL CITRATE (PF) 100 MCG/2ML IJ SOLN
INTRAMUSCULAR | Status: AC
  Filled 2019-12-14: qty 2

## 2019-12-14 MED ORDER — CHLORHEXIDINE GLUCONATE 0.12 % MT SOLN
15.0000 mL | Freq: Once | OROMUCOSAL | Status: AC
  Administered 2019-12-14: 15 mL via OROMUCOSAL

## 2019-12-14 MED ORDER — OXYCODONE HCL 5 MG PO TABS
5.0000 mg | ORAL_TABLET | ORAL | Status: DC | PRN
  Administered 2019-12-14 – 2019-12-15 (×3): 10 mg via ORAL
  Filled 2019-12-14 (×3): qty 2

## 2019-12-14 MED ORDER — ONDANSETRON HCL 4 MG/2ML IJ SOLN
INTRAMUSCULAR | Status: AC
  Filled 2019-12-14: qty 2

## 2019-12-14 MED ORDER — LIDOCAINE HCL 2 % IJ SOLN
INTRAMUSCULAR | Status: AC
  Filled 2019-12-14: qty 20

## 2019-12-14 MED ORDER — SERTRALINE HCL 50 MG PO TABS: 50.0000 mg | ORAL_TABLET | Freq: Every day | ORAL | Status: DC

## 2019-12-14 MED ORDER — ONDANSETRON 4 MG PO TBDP: 4.0000 mg | ORAL_TABLET | Freq: Four times a day (QID) | ORAL | Status: DC | PRN

## 2019-12-14 MED ORDER — CHLORHEXIDINE GLUCONATE CLOTH 2 % EX PADS: 6.0000 | MEDICATED_PAD | Freq: Once | CUTANEOUS | Status: DC

## 2019-12-14 MED ORDER — KETAMINE HCL 10 MG/ML IJ SOLN
INTRAMUSCULAR | Status: DC | PRN
  Administered 2019-12-14 (×2): 10 mg via INTRAVENOUS

## 2019-12-14 MED ORDER — ESMOLOL HCL 100 MG/10ML IV SOLN
INTRAVENOUS | Status: DC | PRN
  Administered 2019-12-14 (×2): 20 mg via INTRAVENOUS

## 2019-12-14 MED ORDER — 0.9 % SODIUM CHLORIDE (POUR BTL) OPTIME
TOPICAL | Status: DC | PRN
  Administered 2019-12-14: 1000 mL

## 2019-12-14 MED ORDER — OXYCODONE HCL 5 MG PO TABS: 5.0000 mg | ORAL_TABLET | Freq: Once | ORAL | Status: DC | PRN

## 2019-12-14 MED ORDER — ACETAMINOPHEN 325 MG PO TABS: 650.0000 mg | ORAL_TABLET | Freq: Four times a day (QID) | ORAL | Status: DC | PRN

## 2019-12-14 MED ORDER — DEXAMETHASONE SODIUM PHOSPHATE 10 MG/ML IJ SOLN
INTRAMUSCULAR | Status: AC
  Filled 2019-12-14: qty 1

## 2019-12-14 MED ORDER — HYDROMORPHONE HCL 1 MG/ML IJ SOLN: 1.0000 mg | INTRAMUSCULAR | Status: DC | PRN

## 2019-12-14 MED ORDER — ORAL CARE MOUTH RINSE: 15.0000 mL | Freq: Once | OROMUCOSAL | Status: AC

## 2019-12-14 MED ORDER — LIDOCAINE 2% (20 MG/ML) 5 ML SYRINGE
INTRAMUSCULAR | Status: AC
  Filled 2019-12-14: qty 5

## 2019-12-14 MED ORDER — MIDAZOLAM HCL 2 MG/2ML IJ SOLN
INTRAMUSCULAR | Status: DC | PRN
  Administered 2019-12-14: 2 mg via INTRAVENOUS

## 2019-12-14 MED ORDER — PROPOFOL 10 MG/ML IV BOLUS
INTRAVENOUS | Status: AC
  Filled 2019-12-14: qty 20

## 2019-12-14 MED ORDER — INSULIN ASPART 100 UNIT/ML ~~LOC~~ SOLN
0.0000 [IU] | Freq: Three times a day (TID) | SUBCUTANEOUS | Status: DC
  Administered 2019-12-14: 3 [IU] via SUBCUTANEOUS
  Administered 2019-12-15: 2 [IU] via SUBCUTANEOUS

## 2019-12-14 MED ORDER — ACETAMINOPHEN 650 MG RE SUPP: 650.0000 mg | Freq: Four times a day (QID) | RECTAL | Status: DC | PRN

## 2019-12-14 MED ORDER — ROCURONIUM BROMIDE 10 MG/ML (PF) SYRINGE
PREFILLED_SYRINGE | INTRAVENOUS | Status: DC | PRN
  Administered 2019-12-14: 20 mg via INTRAVENOUS
  Administered 2019-12-14: 50 mg via INTRAVENOUS

## 2019-12-14 MED ORDER — SODIUM CHLORIDE 0.45 % IV SOLN: INTRAVENOUS | Status: DC

## 2019-12-14 MED ORDER — FENTANYL CITRATE (PF) 100 MCG/2ML IJ SOLN
INTRAMUSCULAR | Status: DC | PRN
  Administered 2019-12-14 (×5): 50 ug via INTRAVENOUS

## 2019-12-14 MED ORDER — CEFAZOLIN SODIUM-DEXTROSE 2-4 GM/100ML-% IV SOLN
2.0000 g | INTRAVENOUS | Status: AC
  Administered 2019-12-14: 2 g via INTRAVENOUS
  Filled 2019-12-14: qty 100

## 2019-12-14 MED ORDER — PROPOFOL 10 MG/ML IV BOLUS
INTRAVENOUS | Status: DC | PRN
  Administered 2019-12-14: 150 mg via INTRAVENOUS

## 2019-12-14 MED ORDER — LIDOCAINE 2% (20 MG/ML) 5 ML SYRINGE
INTRAMUSCULAR | Status: DC | PRN
  Administered 2019-12-14: 60 mg via INTRAVENOUS

## 2019-12-14 MED ORDER — LACTATED RINGERS IV SOLN: INTRAVENOUS | Status: DC

## 2019-12-14 MED ORDER — MEPERIDINE HCL 50 MG/ML IJ SOLN: 6.2500 mg | INTRAMUSCULAR | Status: DC | PRN

## 2019-12-14 MED ORDER — ONDANSETRON HCL 4 MG/2ML IJ SOLN
4.0000 mg | Freq: Four times a day (QID) | INTRAMUSCULAR | Status: DC | PRN
  Administered 2019-12-14: 4 mg via INTRAVENOUS
  Filled 2019-12-14: qty 2

## 2019-12-14 MED ORDER — TRAMADOL HCL 50 MG PO TABS
50.0000 mg | ORAL_TABLET | Freq: Four times a day (QID) | ORAL | Status: DC | PRN
  Administered 2019-12-14: 50 mg via ORAL
  Filled 2019-12-14: qty 1

## 2019-12-14 SURGICAL SUPPLY — 31 items
ADH SKN CLS APL DERMABOND .7 (GAUZE/BANDAGES/DRESSINGS) ×1
APL PRP STRL LF DISP 70% ISPRP (MISCELLANEOUS) ×1
ATTRACTOMAT 16X20 MAGNETIC DRP (DRAPES) ×3 IMPLANT
BLADE SURG 15 STRL LF DISP TIS (BLADE) ×1 IMPLANT
BLADE SURG 15 STRL SS (BLADE) ×3
CHLORAPREP W/TINT 26 (MISCELLANEOUS) ×3 IMPLANT
CLIP VESOCCLUDE MED 6/CT (CLIP) ×7 IMPLANT
CLIP VESOCCLUDE SM WIDE 6/CT (CLIP) ×8 IMPLANT
COVER SURGICAL LIGHT HANDLE (MISCELLANEOUS) ×3 IMPLANT
COVER WAND RF STERILE (DRAPES) ×3 IMPLANT
DERMABOND ADVANCED (GAUZE/BANDAGES/DRESSINGS) ×2
DERMABOND ADVANCED .7 DNX12 (GAUZE/BANDAGES/DRESSINGS) ×1 IMPLANT
DRAPE LAPAROTOMY T 98X78 PEDS (DRAPES) ×3 IMPLANT
ELECT PENCIL ROCKER SW 15FT (MISCELLANEOUS) ×3 IMPLANT
ELECT REM PT RETURN 15FT ADLT (MISCELLANEOUS) ×3 IMPLANT
GAUZE 4X4 16PLY RFD (DISPOSABLE) ×3 IMPLANT
GLOVE SURG ORTHO 8.0 STRL STRW (GLOVE) ×3 IMPLANT
GOWN STRL REUS W/TWL XL LVL3 (GOWN DISPOSABLE) ×6 IMPLANT
HEMOSTAT SURGICEL 2X4 FIBR (HEMOSTASIS) ×3 IMPLANT
ILLUMINATOR WAVEGUIDE N/F (MISCELLANEOUS) ×3 IMPLANT
KIT BASIN (CUSTOM PROCEDURE TRAY) ×3 IMPLANT
KIT TURNOVER KIT A (KITS) IMPLANT
PACK BASIC VI WITH GOWN DISP (CUSTOM PROCEDURE TRAY) ×3 IMPLANT
SHEARS HARMONIC 9CM CVD (BLADE) ×3 IMPLANT
SUT MNCRL AB 4-0 PS2 18 (SUTURE) ×3 IMPLANT
SUT VIC AB 3-0 SH 18 (SUTURE) ×6 IMPLANT
SYR BULB IRRIG 60ML STRL (SYRINGE) ×3 IMPLANT
TOWEL OR 17X26 10 PK STRL BLUE (TOWEL DISPOSABLE) ×3 IMPLANT
TOWEL OR NON WOVEN STRL DISP B (DISPOSABLE) ×3 IMPLANT
TUBING CONNECTING 10 (TUBING) ×2 IMPLANT
TUBING CONNECTING 10' (TUBING) ×1

## 2019-12-14 NOTE — Anesthesia Postprocedure Evaluation (Signed)
Anesthesia Post Note  Patient: Jenna Wong  Procedure(s) Performed: LEFT THYROID LOBECTOMY (N/A Neck)     Patient location during evaluation: PACU Anesthesia Type: General Level of consciousness: awake and alert Pain management: pain level controlled Vital Signs Assessment: post-procedure vital signs reviewed and stable Respiratory status: spontaneous breathing, nonlabored ventilation and respiratory function stable Cardiovascular status: blood pressure returned to baseline and stable Postop Assessment: no apparent nausea or vomiting Anesthetic complications: no   No complications documented.  Last Vitals:  Vitals:   12/14/19 1300 12/14/19 1315  BP: (!) 157/93 (!) 151/85  Pulse: 84 80  Resp: 14 17  Temp:    SpO2: 100% 99%    Last Pain:  Vitals:   12/14/19 1315  TempSrc:   PainSc: Asleep                 Lynda Rainwater

## 2019-12-14 NOTE — Anesthesia Preprocedure Evaluation (Signed)
Anesthesia Evaluation  Patient identified by MRN, date of birth, ID band Patient awake    Reviewed: Allergy & Precautions, NPO status , Patient's Chart, lab work & pertinent test results  Airway Mallampati: II  TM Distance: >3 FB Neck ROM: Full    Dental no notable dental hx.    Pulmonary neg pulmonary ROS, Current Smoker and Patient abstained from smoking.,    Pulmonary exam normal breath sounds clear to auscultation       Cardiovascular hypertension, Normal cardiovascular exam Rhythm:Regular Rate:Normal     Neuro/Psych Anxiety Depression negative neurological ROS  negative psych ROS   GI/Hepatic negative GI ROS, Neg liver ROS,   Endo/Other  negative endocrine ROSdiabetes  Renal/GU negative Renal ROS  negative genitourinary   Musculoskeletal negative musculoskeletal ROS (+)   Abdominal (+) + obese,   Peds negative pediatric ROS (+)  Hematology negative hematology ROS (+)   Anesthesia Other Findings   Reproductive/Obstetrics negative OB ROS                             Anesthesia Physical Anesthesia Plan  ASA: III  Anesthesia Plan: General   Post-op Pain Management:    Induction: Intravenous  PONV Risk Score and Plan: 2 and Ondansetron, Midazolam and Treatment may vary due to age or medical condition  Airway Management Planned: Oral ETT  Additional Equipment:   Intra-op Plan:   Post-operative Plan: Extubation in OR  Informed Consent: I have reviewed the patients History and Physical, chart, labs and discussed the procedure including the risks, benefits and alternatives for the proposed anesthesia with the patient or authorized representative who has indicated his/her understanding and acceptance.     Dental advisory given  Plan Discussed with: CRNA  Anesthesia Plan Comments:         Anesthesia Quick Evaluation

## 2019-12-14 NOTE — Transfer of Care (Signed)
Immediate Anesthesia Transfer of Care Note  Patient: Jenna Wong  Procedure(s) Performed: LEFT THYROID LOBECTOMY (N/A Neck)  Patient Location: PACU  Anesthesia Type:General  Level of Consciousness: awake  Airway & Oxygen Therapy: Patient Spontanous Breathing and Patient connected to face mask oxygen  Post-op Assessment: Report given to RN, Post -op Vital signs reviewed and stable and Patient moving all extremities X 4  Post vital signs: Reviewed and stable  Last Vitals:  Vitals Value Taken Time  BP    Temp    Pulse 85 12/14/19 1222  Resp 16 12/14/19 1222  SpO2 100 % 12/14/19 1222  Vitals shown include unvalidated device data.  Last Pain:  Vitals:   12/14/19 0952  TempSrc: Oral  PainSc: 0-No pain         Complications: No complications documented.

## 2019-12-14 NOTE — Interval H&P Note (Signed)
History and Physical Interval Note:  12/14/2019 10:22 AM  Jenna Wong  has presented today for surgery, with the diagnosis of MULTIPLE THYROID NODULES, DYSPHAGIA.  The various methods of treatment have been discussed with the patient and family. After consideration of risks, benefits and other options for treatment, the patient has consented to    Procedure(s): LEFT THYROID LOBECTOMY (Left) as a surgical intervention.    The patient's history has been reviewed, patient examined, no change in status, stable for surgery.  I have reviewed the patient's chart and labs.  Questions were answered to the patient's satisfaction.    Armandina Gemma, MD [facility] Office: [place]

## 2019-12-14 NOTE — Anesthesia Procedure Notes (Signed)
Procedure Name: Intubation Date/Time: 12/14/2019 10:55 AM Performed by: Niel Hummer, CRNA Pre-anesthesia Checklist: Patient identified, Emergency Drugs available, Suction available and Patient being monitored Patient Re-evaluated:Patient Re-evaluated prior to induction Oxygen Delivery Method: Circle system utilized Preoxygenation: Pre-oxygenation with 100% oxygen Induction Type: IV induction Ventilation: Mask ventilation without difficulty Laryngoscope Size: Mac and 4 Grade View: Grade I Tube type: Oral Tube size: 7.0 mm Number of attempts: 1 Airway Equipment and Method: Stylet Placement Confirmation: ETT inserted through vocal cords under direct vision,  positive ETCO2 and breath sounds checked- equal and bilateral Secured at: 22 cm Tube secured with: Tape Dental Injury: Teeth and Oropharynx as per pre-operative assessment

## 2019-12-14 NOTE — Op Note (Signed)
Procedure Note  Pre-operative Diagnosis:  Enlarged thyroid, multiple thyroid nodules  Post-operative Diagnosis:  same  Surgeon:  Armandina Gemma, MD  Assistant:  Carlena Hurl, PA-C   Procedure:  Left thyroid lobectomy  Anesthesia:  General  Estimated Blood Loss:  minimal  Drains: none         Specimen: thyroid lobe to pathology  Indications:  Patient is referred by her primary care physician, Dr. Nuala Alpha, for surgical evaluation and management of multiple thyroid nodules and associated dysphagia. Patient had developed dysphagia. She underwent a CT scan of the neck which demonstrated left-sided thyroid nodules which were impinging upon the esophagus. Patient subsequently underwent an ultrasound examination on Nov 04, 2019. This showed 2 nodules in the left thyroid lobe measuring 3.7 cm and 2.6 cm respectively. Biopsies were recommended. No significant nodularity was noted on the right side. Laboratory studies showed a normal TSH level of 1.50. Patient is not on thyroid medication. She has had no prior head or neck surgery. There is no family history of thyroid disease and specifically no history of thyroid cancer. She presents today for thyroid lobectomy.  Procedure Details: Procedure was done in OR #4 at the [facility] The patient was brought to the operating room and placed in a supine position on the operating room table. Following administration of general anesthesia, the patient was positioned and then prepped and draped in the usual aseptic fashion. After ascertaining that an adequate level of anesthesia had been achieved, a small Kocher incision was made with #15 blade. Dissection was carried through subcutaneous tissues and platysma. Hemostasis was achieved with the electrocautery. Skin flaps were elevated cephalad and caudad from the thyroid notch to the sternal notch. A self-retaining retractor was placed for exposure. Strap muscles were incised in the midline  and dissection was begun on the left side. Strap muscles were reflected laterally. The left thyroid lobe was markedly enlarged and nodular. The lobe was gently mobilized with blunt dissection. Superior pole vessels were dissected out and divided individually between small and medium ligaclips with the harmonic scalpel. The thyroid lobe was rolled anteriorly. Branches of the inferior thyroid artery were divided between small ligaclips with the harmonic scalpel. Inferior venous tributaries were divided between ligaclips. Both the superior and inferior parathyroid glands were identified and preserved on their vascular pedicles. The recurrent laryngeal nerve was identified and preserved along its course. The ligament of Gwenlyn Found was released with the electrocautery and the gland was mobilized onto the anterior trachea. Isthmus was mobilized across the midline. There was a small pyramidal lobe present which was resected with the isthmus. The thyroid parenchyma was transected at the junction of the isthmus and contralateral thyroid lobe with the harmonic scalpel. The thyroid lobe and isthmus were submitted to pathology for review.  The neck was irrigated with warm saline. Fibrillar was placed throughout the operative field. Strap muscles were approximated in the midline with interrupted 3-0 Vicryl sutures. Platysma was closed with interrupted 3-0 Vicryl sutures. Skin was closed with a running 4-0 Monocryl subcuticular suture.  Wound was washed and dried and Dermabond was applied. The patient was awakened from anesthesia and brought to the recovery room. The patient tolerated the procedure well.   Armandina Gemma, MD [facility] Office: [phone]

## 2019-12-15 ENCOUNTER — Encounter (HOSPITAL_COMMUNITY): Payer: Self-pay | Admitting: Surgery

## 2019-12-15 DIAGNOSIS — Z7982 Long term (current) use of aspirin: Secondary | ICD-10-CM | POA: Diagnosis not present

## 2019-12-15 DIAGNOSIS — Z7984 Long term (current) use of oral hypoglycemic drugs: Secondary | ICD-10-CM | POA: Diagnosis not present

## 2019-12-15 DIAGNOSIS — I10 Essential (primary) hypertension: Secondary | ICD-10-CM | POA: Diagnosis not present

## 2019-12-15 DIAGNOSIS — E042 Nontoxic multinodular goiter: Secondary | ICD-10-CM | POA: Diagnosis not present

## 2019-12-15 DIAGNOSIS — Z79899 Other long term (current) drug therapy: Secondary | ICD-10-CM | POA: Diagnosis not present

## 2019-12-15 DIAGNOSIS — E7800 Pure hypercholesterolemia, unspecified: Secondary | ICD-10-CM | POA: Diagnosis not present

## 2019-12-15 LAB — BASIC METABOLIC PANEL
Anion gap: 8 (ref 5–15)
BUN: 13 mg/dL (ref 8–23)
CO2: 26 mmol/L (ref 22–32)
Calcium: 8.5 mg/dL — ABNORMAL LOW (ref 8.9–10.3)
Chloride: 103 mmol/L (ref 98–111)
Creatinine, Ser: 0.81 mg/dL (ref 0.44–1.00)
GFR calc Af Amer: >60 mL/min (ref >60)
GFR calc non Af Amer: >60 mL/min (ref >60)
Glucose, Bld: 167 mg/dL — ABNORMAL HIGH (ref 70–99)
Potassium: 4.2 mmol/L (ref 3.5–5.1)
Sodium: 137 mmol/L (ref 135–145)

## 2019-12-15 LAB — GLUCOSE, CAPILLARY: Glucose-Capillary: 130 mg/dL — ABNORMAL HIGH (ref 70–99)

## 2019-12-15 LAB — HEMOGLOBIN A1C
Hgb A1c MFr Bld: 6.5 % — ABNORMAL HIGH (ref 4.8–5.6)
Mean Plasma Glucose: 140 mg/dL

## 2019-12-15 MED ORDER — OXYCODONE HCL 5 MG PO TABS: 5.0000 mg | ORAL_TABLET | ORAL | 0 refills | Status: DC | PRN

## 2019-12-15 NOTE — Discharge Summary (Signed)
Physician Discharge Summary [facility]  Patient ID: Jenna Wong MRN: [id number] DOB/AGE: 1950-12-03 69 y.o.  Admit date: 12/14/2019 Discharge date: 12/15/2019  Admission Diagnoses:  Enlarged thyroid, multiple thyroid nodules  Discharge Diagnoses:  Principal Problem:   Multiple thyroid nodules Active Problems:   Dysphagia   Enlarged thyroid   Discharged Condition: good  Hospital Course: Patient was admitted for observation following thyroid surgery.  Post op course was uncomplicated.  Pain was well controlled.  Tolerated diet.  Post op calcium level on morning following surgery was 8.5 mg/dl.  Patient was prepared for discharge home on POD#1.  Consults: None  Treatments: surgery: left thyroid lobectomy  Discharge Exam: Blood pressure 140/61, pulse 82, temperature 97.9 F (36.6 C), temperature source Oral, resp. rate 15, height 5' 7" (1.702 m), weight 92.5 kg, SpO2 92 %. HEENT - clear Neck - wound dry and intact; mild STS; voice slightly hoarse Chest - clear bilaterally Cor - RRR   Disposition: Home  Discharge Instructions    Diet - low sodium heart healthy   Complete by: As directed    Discharge instructions   Complete by: As directed    [facility]  THYROID & PARATHYROID SURGERY:  POST-OP INSTRUCTIONS  Always review your discharge instruction sheet from the facility where your surgery was performed.  A prescription for pain medication may be given to you upon discharge.  Take your pain medication as prescribed.  If narcotic pain medicine is not needed, then you may take acetaminophen (Tylenol) or ibuprofen (Advil) as needed.  Take your usually prescribed medications unless otherwise directed.  If you need a refill on your pain medication, please contact our office during regular business hours.  Prescriptions cannot be processed by our office after 5 pm or on weekends.  Start with a light diet upon arrival home,  such as soup and crackers or toast.  Be sure to drink plenty of fluids daily.  Resume your normal diet the day after surgery.  Most patients will experience some swelling and bruising on the chest and neck area.  Ice packs will help.  Swelling and bruising can take several days to resolve.   It is common to experience some constipation after surgery.  Increasing fluid intake and taking a stool softener (Colace) will usually help or prevent this problem.  A mild laxative (Milk of Magnesia or Miralax) should be taken according to package directions if there has been no bowel movement after 48 hours.  You have steri-strips and a gauze dressing over your incision.  You may remove the gauze bandage on the second day after surgery, and you may shower at that time.  Leave your steri-strips (small skin tapes) in place directly over the incision.  These strips should remain on the skin for 5-7 days and then be removed.  You may get them wet in the shower and pat them dry.  You may resume regular (light) daily activities beginning the next day (such as daily self-care, walking, climbing stairs) gradually increasing activities as tolerated.  You may have sexual intercourse when it is comfortable.  Refrain from any heavy lifting or straining until approved by your doctor.  You may drive when you no longer are taking prescription pain medication, you can comfortably wear a seatbelt, and you can safely maneuver your car and apply brakes.  You should see your doctor in the office for a follow-up appointment approximately three weeks after your surgery.  Make  sure that you call for this appointment within a day or two after you arrive home to insure a convenient appointment time.  WHEN TO CALL YOUR DOCTOR: -- Fever greater than 101.5 -- Inability to urinate -- Nausea and/or vomiting - persistent -- Extreme swelling or bruising -- Continued bleeding from incision -- Increased pain, redness, or drainage from the  incision -- Difficulty swallowing or breathing -- Muscle cramping or spasms -- Numbness or tingling in hands or around lips  The clinic staff is available to answer your questions during regular business hours.  Please don't hesitate to call and ask to speak to one of the nurses if you have concerns.  Armandina Gemma, MD [facility] Office: [phone]   Increase activity slowly   Complete by: As directed    No dressing needed   Complete by: As directed      Allergies as of 12/15/2019   No Known Allergies     Medication List    TAKE these medications   albuterol 108 (90 Base) MCG/ACT inhaler Commonly known as: ProAir HFA Inhale 2 puffs into the lungs every 6 (six) hours as needed for wheezing or shortness of breath.   aspirin 81 MG tablet Take 81 mg by mouth daily.   atorvastatin 40 MG tablet Commonly known as: LIPITOR Take 1 tablet by mouth once daily   benzonatate 100 MG capsule Commonly known as: TESSALON Take 1 capsule (100 mg total) by mouth every 8 (eight) hours.   buPROPion 150 MG 12 hr tablet Commonly known as: WELLBUTRIN SR Take 1 tablet (133m) daily for the first 3 days. Then take 1 tablet (1558m twice a day (at least 8 hours apart) after that.   fluticasone 50 MCG/ACT nasal spray Commonly known as: FLONASE Place 1 spray into both nostrils daily. 1 spray in each nostril every day What changed:   when to take this  reasons to take this  additional instructions   loratadine 10 MG dissolvable tablet Commonly known as: CLARITIN REDITABS Take 1 tablet (10 mg total) by mouth daily. As needed for allergy symptoms   meclizine 25 MG tablet Commonly known as: ANTIVERT TAKE 1 TABLET BY MOUTH THREE TIMES DAILY AS NEEDED FOR DIZZINESS   meloxicam 15 MG tablet Commonly known as: MOBIC Take 1 tablet (15 mg total) daily by mouth.   metFORMIN 1000 MG tablet Commonly known as: GLUCOPHAGE Take 1 tablet by mouth twice daily with food     ondansetron 4 MG tablet Commonly known as: ZOFRAN Take 1 tablet (4 mg total) by mouth every 8 (eight) hours as needed for nausea.   ONE TOUCH DELICA LANCING DEV Misc 1 applicator by Does not apply route every morning.   OneTouch Delica Plus [id number] USE 1 LANCET TO CHECK GLUCOSE ONCE DAILY IN THE MORNING   OneTouch Verio Flex System w/Device Kit 1 applicator by Does not apply route every morning.   OneTouch Verio test strip Generic drug: glucose blood USE AS DIRECTED TO  CHECK  BLOOD  SUGAR   OneTouch Verio test strip Generic drug: glucose blood 1 each by Other route See admin instructions. Use 4 times per day, once in the morning fasting, and then before each meal   oxyCODONE 5 MG immediate release tablet Commonly known as: Oxy IR/ROXICODONE Take 1-2 tablets (5-10 mg total) by mouth every 4 (four) hours as needed for moderate pain.   polyethylene glycol powder 17 GM/SCOOP powder Commonly known as: GLYCOLAX/MIRALAX Take 17 g by  mouth daily.   sertraline 50 MG tablet Commonly known as: ZOLOFT 1 tablet a day for 1 week. Then 1 tablet twice a day for 1 week. Then 2 tablets in the morning and 1 tablets at night after that. What changed:   how much to take  how to take this  when to take this  additional instructions   vitamin B-12 1000 MCG tablet Commonly known as: CYANOCOBALAMIN Take 1,000 mcg by mouth daily.            Discharge Care Instructions  (From admission, onward)         Start     Ordered   12/15/19 0000  No dressing needed        12/15/19 0848          Follow-up Information    Armandina Gemma, MD. Schedule an appointment as soon as possible for a visit in 3 week(s).   Specialty: General Surgery Contact information: [address] [place] [address] [phone]               Earnstine Regal, MD, [facility] Office: [phone]   Signed: Armandina Gemma 12/15/2019, 8:48 AM

## 2019-12-16 LAB — SURGICAL PATHOLOGY

## 2019-12-17 NOTE — Progress Notes (Signed)
Please contact patient and notify of benign pathology results.  Japneet Staggs M. Brody Kump, MD, FACS [facility] Office: [phone]   

## 2019-12-24 ENCOUNTER — Other Ambulatory Visit: Payer: Self-pay

## 2019-12-24 MED ORDER — SERTRALINE HCL 50 MG PO TABS: 50.0000 mg | ORAL_TABLET | Freq: Two times a day (BID) | ORAL | 3 refills | Status: DC

## 2020-01-11 ENCOUNTER — Other Ambulatory Visit: Payer: Self-pay | Admitting: Family Medicine

## 2020-01-12 DIAGNOSIS — E042 Nontoxic multinodular goiter: Secondary | ICD-10-CM | POA: Diagnosis not present

## 2020-02-17 ENCOUNTER — Other Ambulatory Visit: Payer: Self-pay

## 2020-02-17 ENCOUNTER — Encounter: Payer: Self-pay | Admitting: Family Medicine

## 2020-02-17 ENCOUNTER — Ambulatory Visit (INDEPENDENT_AMBULATORY_CARE_PROVIDER_SITE_OTHER): Payer: Medicare Other | Admitting: Family Medicine

## 2020-02-17 VITALS — BP 122/64 | HR 105 | Wt 201.0 lb

## 2020-02-17 DIAGNOSIS — M62838 Other muscle spasm: Secondary | ICD-10-CM | POA: Diagnosis not present

## 2020-02-17 MED ORDER — BACLOFEN 10 MG PO TABS: 10.0000 mg | ORAL_TABLET | Freq: Three times a day (TID) | ORAL | 0 refills | Status: DC

## 2020-02-17 NOTE — Patient Instructions (Signed)
it was nice to meet you today,  Believe your pain is from a muscle spasm and your trapezius muscle.  I will prescribe you a short-term course of muscle relaxer, baclofen.  You can take this medicine up to 3 times a day, but I would start by taking it tonight and then building up to 3 times a day as tolerated.  It can make you drowsy so do not drive after taking it into your used to it.  You should also apply a heating pad or warm washcloth to the area to help loosen up the muscle tissue and you can continue taking Tylenol as needed.  Follow-up as needed.  I have a great day,  Clemetine Marker, MD

## 2020-02-17 NOTE — Progress Notes (Signed)
    SUBJECTIVE:   CHIEF COMPLAINT / HPI:   Neck and shoulder pain: neck and left sided shoulder pain.  Pain goes up to her ear.  Going on for 6 days.  Intensity is the same.  Has been taking tylenol but nothing else. No numbness or weakness of the left arm.  No headaches.  Has not been doing any heavy lifting.  Did not fall or injure the shoulder or arm.  Pain is not worse with chewing.  No loss of hearing.  No shortness of breath or chest pain.  PERTINENT  PMH / PSH: Thyroid nodules, status post partial thyroidectomy  OBJECTIVE:   BP 122/64   Pulse (!) 105   Wt 201 lb (91.2 kg)   SpO2 98%   BMI 31.48 kg/m   General: Alert and oriented.  No acute distress CV: Regular rate and rhythm, no murmurs. Pulmonary: Lungs clear to auscultation bilaterally.  No wheezes Neck: Scar midline from recent thyroid surgery.  No masses.  No lymphadenopathy.  Tenderness to palpation in the trapezius and sternocleidomastoid on the left side.  No midline tenderness MSK: Tenderness to palpation of the left trapezius and deltoid.  Left shoulder strength diminished.  Weakness on empty can test and posterior pain with Hawkins test.  ASSESSMENT/PLAN:   Trapezius muscle spasm Although she is positive on empty cans and Hawkins tests, the location of her pain is more consistent with a trapezius muscle spasm.  Also does not have any hobbies or job that require repetitive use of the shoulders.  Already taking Tylenol.  Advised patient to also use apply heat to the area and gave her prescription for baclofen.  Advised patient to follow-up if not improved.     Benay Pike, MD [place]

## 2020-02-18 DIAGNOSIS — M62838 Other muscle spasm: Secondary | ICD-10-CM | POA: Insufficient documentation

## 2020-02-18 HISTORY — DX: Other muscle spasm: M62.838

## 2020-02-18 NOTE — Assessment & Plan Note (Signed)
Although she is positive on empty cans and Hawkins tests, the location of her pain is more consistent with a trapezius muscle spasm.  Also does not have any hobbies or job that require repetitive use of the shoulders.  Already taking Tylenol.  Advised patient to also use apply heat to the area and gave her prescription for baclofen.  Advised patient to follow-up if not improved.

## 2020-02-22 ENCOUNTER — Other Ambulatory Visit: Payer: Self-pay | Admitting: Family Medicine

## 2020-02-22 DIAGNOSIS — E118 Type 2 diabetes mellitus with unspecified complications: Secondary | ICD-10-CM

## 2020-02-29 ENCOUNTER — Other Ambulatory Visit: Payer: Self-pay | Admitting: Family Medicine

## 2020-02-29 DIAGNOSIS — E118 Type 2 diabetes mellitus with unspecified complications: Secondary | ICD-10-CM

## 2020-03-30 DIAGNOSIS — E042 Nontoxic multinodular goiter: Secondary | ICD-10-CM | POA: Diagnosis not present

## 2020-03-30 DIAGNOSIS — Z9009 Acquired absence of other part of head and neck: Secondary | ICD-10-CM | POA: Diagnosis not present

## 2020-04-05 ENCOUNTER — Other Ambulatory Visit: Payer: Self-pay | Admitting: Family Medicine

## 2020-05-17 ENCOUNTER — Other Ambulatory Visit: Payer: Self-pay | Admitting: Family Medicine

## 2020-05-17 DIAGNOSIS — R42 Dizziness and giddiness: Secondary | ICD-10-CM

## 2020-05-20 ENCOUNTER — Other Ambulatory Visit: Payer: Self-pay | Admitting: Family Medicine

## 2020-05-20 DIAGNOSIS — R42 Dizziness and giddiness: Secondary | ICD-10-CM

## 2020-05-25 NOTE — Progress Notes (Signed)
    SUBJECTIVE:   CHIEF COMPLAINT / HPI: Difficulty walking  Patient reports that she has been experiencing difficulty with ambulation for two weeks She describes a feeling of "being drunk" and not able to walk in a straight line She has been using a cane to help with balance She denies any falls due to these symptoms  She reports a previous episode of these symptoms before but that it did not last this long  Denies weakness  Denies Paresthesia/numbness Denies wounds or recent trauma Denies presence of headache, Denies any recent illness, no fevers, chills or cough  +constipation  Sister reports that patient sometimes seems confused, patient denies this  No dizzy symptoms with turning over  in bed  She reports some nausea that occurs infrequently  Denies having a sensation of the room spinining  Was prescribed meclizinel, tried and it only made her sleepy  Sister reports assisting her to get in the restroom due to be unsteady on her feet  deneis having any weakness in her muscles of her extremities  There have been no observed episodes of facial drooping or slurring of her words, per sister  She is concerned that she may have neurological problems or a stroke as the cause of her symptoms    Reports sometimes having blood glucoses ranging from 140-150s in the AM with fasting   Constipation Patient reports long history of this near end of visit and has not been responsive to medications in the past. Sister expressed concern and would like for patient to re-establish with gastroenterology.   PERTINENT  PMH / PSH:  Diabetes Hypertension History of dizziness Spinal stenosis, lumbar  OBJECTIVE:   BP 118/64   Pulse 90   Ht 5\' 7"  (1.702 m)   Wt 209 lb (94.8 kg)   SpO2 98%   BMI 32.73 kg/m   General: female appearing stated age in no acute distress, ambulating with cane  HEENT: MMM, no oral lesions noted,Neck non-tender without lymphadenopathy Cardio: Normal S1 and S2, no S3  or S4. Rhythm is regular. No murmurs or rubs.  Bilateral radial pulses palpable Pulm: Clear to auscultation bilaterally, no crackles, wheezing, or diminished breath sounds. Normal respiratory effort Abdomen: Bowel sounds normal. Abdomen soft and non-tender.  Extremities: No peripheral edema. Warm & well perfused.  Neuro: pt alert and oriented x4, follows commands, PERRLA, EOMI bilaterally, 5/5 strength in bilateral upper and lower extremities, CN 2-12 grossly intact bilaterally, sensation appears to be more perceived on the left sided extremities  in comparison to right, patient's gait appears to be unsteady with deviation towards the towards her left, initially challenged by fine finger movements but improved with repetition, normal heel to shin test bilaterally   ASSESSMENT/PLAN:   Gait disturbance New gait disturbance with concern for TIA, stroke or some other intracranial (cerebellar) change.  Discussed differential with patient and sister with recommendation for head imaging.  Brain MRI ordered, will follow up with patients once results available  Constipation GI referral submitted for patient to re-establish care   Diabetes ([place]) CBG 111, fasting  Although at a risk factor for neuropathy, not clear etiology for patient's presenting symptoms  Patient to continue with current management     Eulis Foster, MD [place]

## 2020-05-26 ENCOUNTER — Other Ambulatory Visit: Payer: Self-pay

## 2020-05-26 ENCOUNTER — Encounter: Payer: Self-pay | Admitting: Family Medicine

## 2020-05-26 ENCOUNTER — Ambulatory Visit (INDEPENDENT_AMBULATORY_CARE_PROVIDER_SITE_OTHER): Payer: Medicare Other | Admitting: Family Medicine

## 2020-05-26 VITALS — BP 118/64 | HR 90 | Ht 67.0 in | Wt 209.0 lb

## 2020-05-26 DIAGNOSIS — E119 Type 2 diabetes mellitus without complications: Secondary | ICD-10-CM

## 2020-05-26 DIAGNOSIS — R269 Unspecified abnormalities of gait and mobility: Secondary | ICD-10-CM | POA: Diagnosis not present

## 2020-05-26 DIAGNOSIS — R42 Dizziness and giddiness: Secondary | ICD-10-CM

## 2020-05-26 DIAGNOSIS — K581 Irritable bowel syndrome with constipation: Secondary | ICD-10-CM

## 2020-05-26 DIAGNOSIS — K5904 Chronic idiopathic constipation: Secondary | ICD-10-CM

## 2020-05-26 LAB — GLUCOSE, POCT (MANUAL RESULT ENTRY): POC Glucose: 111 mg/dl — AB (ref 70–99)

## 2020-05-26 NOTE — Patient Instructions (Signed)
We have decided to order a MRI of your brain to see what can be causing you to feel unsteady on your feet. Please let us know if you would like to consider physical therapy to help with your gait.  Once scheduled, we will follow-up with your results and determine treatment plan going forward.  In the meantime, I have referred you to gastroenterology to follow-up on your history of irritable bowel syndrome and constipation.  Please follow-up with me in 3-4 weeks or after you have been able to complete your MRI

## 2020-05-28 ENCOUNTER — Other Ambulatory Visit: Payer: Self-pay

## 2020-05-28 ENCOUNTER — Ambulatory Visit (HOSPITAL_COMMUNITY)
Admission: RE | Admit: 2020-05-28 | Discharge: 2020-05-28 | Disposition: A | Discharge status: Home / Self Care | Payer: Medicare Other | Source: Ambulatory Visit | Attending: Family Medicine | Admitting: Family Medicine

## 2020-05-28 DIAGNOSIS — R42 Dizziness and giddiness: Secondary | ICD-10-CM | POA: Diagnosis not present

## 2020-05-28 DIAGNOSIS — R269 Unspecified abnormalities of gait and mobility: Secondary | ICD-10-CM | POA: Insufficient documentation

## 2020-05-28 DIAGNOSIS — K5900 Constipation, unspecified: Secondary | ICD-10-CM | POA: Insufficient documentation

## 2020-05-28 HISTORY — DX: Unspecified abnormalities of gait and mobility: R26.9

## 2020-05-28 HISTORY — DX: Constipation, unspecified: K59.00

## 2020-05-28 NOTE — Assessment & Plan Note (Signed)
GI referral submitted for patient to re-establish care

## 2020-05-28 NOTE — Assessment & Plan Note (Signed)
CBG 111, fasting  Although at a risk factor for neuropathy, not clear etiology for patient's presenting symptoms  Patient to continue with current management

## 2020-05-28 NOTE — Assessment & Plan Note (Signed)
New gait disturbance with concern for TIA, stroke or some other intracranial (cerebellar) change.  Discussed differential with patient and sister with recommendation for head imaging.  Brain MRI ordered, will follow up with patients once results available

## 2020-05-30 ENCOUNTER — Encounter: Payer: Self-pay | Admitting: Family Medicine

## 2020-05-30 NOTE — Progress Notes (Signed)
Result letter for MRI without acute intracranial abnormalities. C3-C4 disc protrusion noted incidentally.

## 2020-06-27 ENCOUNTER — Ambulatory Visit (INDEPENDENT_AMBULATORY_CARE_PROVIDER_SITE_OTHER): Payer: Medicare Other | Admitting: Family Medicine

## 2020-06-27 ENCOUNTER — Other Ambulatory Visit: Payer: Self-pay

## 2020-06-27 ENCOUNTER — Encounter: Payer: Self-pay | Admitting: Family Medicine

## 2020-06-27 VITALS — BP 124/70 | HR 88 | Ht 67.0 in | Wt 213.4 lb

## 2020-06-27 DIAGNOSIS — E119 Type 2 diabetes mellitus without complications: Secondary | ICD-10-CM | POA: Diagnosis not present

## 2020-06-27 DIAGNOSIS — Z0000 Encounter for general adult medical examination without abnormal findings: Secondary | ICD-10-CM

## 2020-06-27 DIAGNOSIS — Z23 Encounter for immunization: Secondary | ICD-10-CM | POA: Diagnosis not present

## 2020-06-27 LAB — POCT GLYCOSYLATED HEMOGLOBIN (HGB A1C): Hemoglobin A1C: 6.9 % — AB (ref 4.0–5.6)

## 2020-06-27 LAB — GLUCOSE, POCT (MANUAL RESULT ENTRY): POC Glucose: 206 mg/dl — AB (ref 70–99)

## 2020-06-27 LAB — POCT UA - MICROALBUMIN
Albumin/Creatinine Ratio, Urine, POC: <30
Creatinine, POC: 50 mg/dL
Microalbumin Ur, POC: 10 mg/L

## 2020-06-27 MED ORDER — BACLOFEN 10 MG PO TABS
10.0000 mg | ORAL_TABLET | Freq: Three times a day (TID) | ORAL | 0 refills | Status: DC
Start: 2020-06-27 — End: 2021-01-09

## 2020-06-27 MED ORDER — TRULICITY 0.75 MG/0.5ML ~~LOC~~ SOAJ: 0.7500 mg | SUBCUTANEOUS | 3 refills | Status: DC

## 2020-06-27 NOTE — Progress Notes (Signed)
    SUBJECTIVE:   CHIEF COMPLAINT / HPI: elevated blood glucose   Patient reports elevated blood glucose despite taking metformin regularly. She reports blood glucose ranges 180s into 200s when she checks her fasting blood glucose levels. She reports having dizziness at baseline and does not have any measurements <100. Patient reports having multiple family members with kidney problems due to diabetes which is why she wanted to be evaluated. Review of her last Cr showed 0.81in June of 2021. She does not report polydipsia today or any HA.   PERTINENT  PMH / PSH:  DM, non-insulin dependent   OBJECTIVE:   BP 124/70   Pulse 88   Ht 5\' 7"  (1.702 m)   Wt 213 lb 6 oz (96.8 kg)   SpO2 98%   BMI 33.42 kg/m    General: female appearing stated age in no acute distress Cardio: Normal S1 and S2, no S3 or S4. Rhythm is regular.  Pulm: Clear to auscultation bilaterally, no crackles, wheezing, or diminished breath sounds. Normal respiratory effort, stable on RA Abdomen: Bowel sounds normal. Abdomen soft and non-tender. Extremities: No peripheral edema. Warm/ well perfused. Neuro: pt alert and oriented x4   ASSESSMENT/PLAN:   Diabetes (HCC) started GLP-1 agonist  Hgb A1c re-checked today, increased to 6.9 from 6.5 previously   CBG checked in clinic, 206 DM foot exam deferred per patient preference  Urine microalbumin checked   Healthcare maintenance Patient agreed to influenza vaccine today    , MD [facility]

## 2020-06-27 NOTE — Assessment & Plan Note (Addendum)
started GLP-1 agonist  Hgb A1c re-checked today, increased to 6.9 from 6.5 previously   CBG checked in clinic, 206 DM foot exam deferred per patient preference  Urine microalbumin checked

## 2020-06-27 NOTE — Patient Instructions (Addendum)
I have prescribed a mediciation called Trulicity for you to inject once a week to help with your blood glucose. You will start with a 0.75mg  dose and follow up with me in 4 weeks.   Your Hgb A1c was slightly increased to 6.9 from 6.5 which correlates with your elevated blood sugars.   Please continue to monitor for any symptoms of hypoglycemia including worsening dizziness, lightheadedness (feeling like you may pass out), nausea, vomiting or inability to eat.   We will collect a urine sample to make sure your kidneys are still filtering properly.

## 2020-06-29 NOTE — Assessment & Plan Note (Signed)
Patient agreed to influenza vaccine today

## 2020-07-27 ENCOUNTER — Other Ambulatory Visit: Payer: Self-pay | Admitting: Family Medicine

## 2020-07-27 DIAGNOSIS — Z1231 Encounter for screening mammogram for malignant neoplasm of breast: Secondary | ICD-10-CM

## 2020-08-04 ENCOUNTER — Other Ambulatory Visit: Payer: Self-pay | Admitting: Family Medicine

## 2020-08-19 ENCOUNTER — Other Ambulatory Visit: Payer: Self-pay | Admitting: Family Medicine

## 2020-08-19 DIAGNOSIS — E118 Type 2 diabetes mellitus with unspecified complications: Secondary | ICD-10-CM

## 2020-09-08 ENCOUNTER — Other Ambulatory Visit: Payer: Self-pay | Admitting: Family Medicine

## 2020-09-08 DIAGNOSIS — E118 Type 2 diabetes mellitus with unspecified complications: Secondary | ICD-10-CM

## 2020-09-13 ENCOUNTER — Ambulatory Visit
Admission: RE | Admit: 2020-09-13 | Discharge: 2020-09-13 | Disposition: A | Discharge status: Home / Self Care | Payer: Medicare Other | Source: Ambulatory Visit | Attending: *Deleted | Admitting: *Deleted

## 2020-09-13 ENCOUNTER — Other Ambulatory Visit: Payer: Self-pay

## 2020-09-13 DIAGNOSIS — Z1231 Encounter for screening mammogram for malignant neoplasm of breast: Secondary | ICD-10-CM

## 2020-09-25 ENCOUNTER — Other Ambulatory Visit: Payer: Self-pay | Admitting: Family Medicine

## 2020-10-03 ENCOUNTER — Ambulatory Visit (INDEPENDENT_AMBULATORY_CARE_PROVIDER_SITE_OTHER): Payer: Medicare HMO | Admitting: Family Medicine

## 2020-10-03 ENCOUNTER — Other Ambulatory Visit: Payer: Self-pay

## 2020-10-03 DIAGNOSIS — L03315 Cellulitis of perineum: Secondary | ICD-10-CM

## 2020-10-03 DIAGNOSIS — L0390 Cellulitis, unspecified: Secondary | ICD-10-CM | POA: Insufficient documentation

## 2020-10-03 HISTORY — DX: Cellulitis, unspecified: L03.90

## 2020-10-03 MED ORDER — DOXYCYCLINE HYCLATE 100 MG PO TABS: 100.0000 mg | ORAL_TABLET | Freq: Two times a day (BID) | ORAL | 0 refills | Status: AC

## 2020-10-03 NOTE — Patient Instructions (Signed)
It was good to see you today.  Thank you for coming in.  I think you have cellulitis or an infection of your skin.      I am prescribing Doxycycline to be taken 2 times a day for 7 days.  I also recommend using a warm compress on the area frequently to help with the swelling and taking Ibuprofen for the pain.  Please come back to see [place] again in 2 days to see if it has improved or is drainable.  Be Well, Dr Manus Rudd

## 2020-10-03 NOTE — Assessment & Plan Note (Addendum)
Likely diagnosis given history and location.  No fever or systemic symptoms.  Examined GU area with Chaperone present and preceptor Dr Owens Shark.  No area of fluctuance ideal for simple I&D and no sign of spread to labia or vagina. - Prescribed Doxycycline 100 mg for 7 days - Recommend using warm compress frequently - Can use Ibuprofen PRN as needed for pain - follow up in 2 days for re-examination/possible drainage if ideal, or sooner if develop systemic symptoms

## 2020-10-03 NOTE — Progress Notes (Signed)
    SUBJECTIVE:   CHIEF COMPLAINT / HPI: Knot on buttocks  Patient is here due to reported painful know on buttocks.  Indicates this started Saturday and has gotten larger sicne then. Indicates unable to sit down normal and rates as an an on pain scale of 1-10.  Denies any itchiness or recent trauma to area.  Also denies any fluid expression.  Denies fever or other systemic symptoms.  Has had similar episode several years in past that was treated with I&D.  PERTINENT  PMH / PSH: Diabetes  OBJECTIVE:   BP 131/80   Pulse (!) 111   Temp 99.9 F (37.7 C)   Ht 5\' 7"  (1.702 m)   Wt 211 lb 3.2 oz (95.8 kg)   SpO2 97%   BMI 33.08 kg/m    Physical Exam Exam conducted with a chaperone present.  Constitutional:      Appearance: Normal appearance.  HENT:     Head: Normocephalic and atraumatic.  Cardiovascular:     Rate and Rhythm: Normal rate and regular rhythm.  Pulmonary:     Effort: Pulmonary effort is normal.     Breath sounds: Normal breath sounds.  Genitourinary:    Pubic Area: No rash.        Comments: 2 cm area of tenderness and induration, no fluctuance, vaginal wall examined and no sign of mass extension into labia or vaginal wall Neurological:     Mental Status: She is alert.     ASSESSMENT/PLAN:   Cellulitis Likely diagnosis given history and location.  No fever or systemic symptoms.  No area of fluctuance ideal for simple I&D and no sign of spread to labia or vagina. - Prescribed Doxycycline 100 mg for 7 days - Recommend using warm compress frequently - Can use Ibuprofen PRN as needed for pain - follow up in 2 days for re-examination/possible drainage if ideal, or sooner if develop systemic symptoms     Delora Fuel, MD [place]

## 2020-10-19 ENCOUNTER — Other Ambulatory Visit: Payer: Self-pay | Admitting: Family Medicine

## 2020-11-16 ENCOUNTER — Other Ambulatory Visit: Payer: Self-pay | Admitting: Family Medicine

## 2020-11-16 DIAGNOSIS — E118 Type 2 diabetes mellitus with unspecified complications: Secondary | ICD-10-CM

## 2020-11-28 ENCOUNTER — Telehealth: Payer: Self-pay | Admitting: *Deleted

## 2020-11-28 NOTE — Telephone Encounter (Signed)
Spoke with pt. She will come by office 11/29/20 sometime after 11am to sign [employer] application for assistance with Trulicity medication

## 2020-11-28 NOTE — Telephone Encounter (Signed)
Hi [place],  This patient needs assistance getting her trulicity.  She has the application but needs help filling it out.  I advised that Rosendo Gros could help her with that process.  Please give her a call when you can, she is out of trulicity. Christen Bame, CMA

## 2020-12-01 NOTE — Progress Notes (Signed)
Submitted application for TRULICITY 0.75MG /0.5ML to [place] [facility] for patient assistance.   Phone: [phone]

## 2020-12-08 ENCOUNTER — Ambulatory Visit (INDEPENDENT_AMBULATORY_CARE_PROVIDER_SITE_OTHER): Payer: Medicare HMO

## 2020-12-08 ENCOUNTER — Other Ambulatory Visit: Payer: Self-pay

## 2020-12-08 VITALS — Ht 67.0 in | Wt 205.0 lb

## 2020-12-08 DIAGNOSIS — Z0000 Encounter for general adult medical examination without abnormal findings: Secondary | ICD-10-CM

## 2020-12-08 NOTE — Patient Instructions (Signed)
You spoke to Jenna Wong, Jenna Wong over the phone for your annual wellness visit.  We discussed goals:  Smoking Cessation. See below. 1PPD (11/2020)  We also discussed recommended health maintenance. Please call our office and schedule a visit. As discussed, you are due for:  Health Maintenance  Topic Date Due   OPHTHALMOLOGY EXAM  Never done   DEXA SCAN  Never done   Zoster Vaccines- Shingrix (2 of 2) 06/07/2019   FOOT EXAM  12/31/2019   PNA vac Low Risk Adult (2 of 2 - PCV13) 04/11/2020   COVID-19 Vaccine (4 - Booster for Pfizer series) 08/26/2020   HEMOGLOBIN A1C  12/25/2020   INFLUENZA VACCINE  01/23/2021   URINE MICROALBUMIN  06/27/2021   COLONOSCOPY (Pts 45-62yr Insurance coverage will need to be confirmed)  11/28/2021   MAMMOGRAM  09/14/2022   TETANUS/TDAP  07/09/2023   Hepatitis C Screening  Completed   HPV VACCINES  Aged Out   PCP apt scheduled for 7/18. Plan to get covid booster at PCP apt.  Bring vaccine card so we can update.  Fill out advance directive packet I gave you.    We also discussed smoking cessation.  1-800-QUITNOW  Preventive Care 675Years and Older, Female Preventive care refers to lifestyle choices and visits with your health care provider that can promote health and wellness. This includes: A yearly physical exam. This is also called an annual wellness visit. Regular dental and eye exams. Immunizations. Screening for certain conditions. Healthy lifestyle choices, such as: Eating a healthy diet. Getting regular exercise. Not using drugs or products that contain nicotine and tobacco. Limiting alcohol use. What can I expect for my preventive care visit? Physical exam Your health care provider will check your: Height and weight. These may be used to calculate your BMI (body mass index). BMI is a measurement that tells if you are at a healthy weight. Heart rate and blood pressure. Body temperature. Skin for abnormal spots. Counseling Your health  care provider may ask you questions about your: Past medical problems. Family's medical history. Alcohol, tobacco, and drug use. Emotional well-being. Home life and relationship well-being. Sexual activity. Diet, exercise, and sleep habits. History of falls. Memory and ability to understand (cognition). Work and work eStatistician Pregnancy and menstrual history. Access to firearms. What immunizations do I need?  Vaccines are usually given at various ages, according to a schedule. Your health care provider will recommend vaccines for you based on your age, medicalhistory, and lifestyle or other factors, such as travel or where you work. What tests do I need? Blood tests Lipid and cholesterol levels. These may be checked every 5 years, or more often depending on your overall health. Hepatitis C test. Hepatitis B test. Screening Lung cancer screening. You may have this screening every year starting at age 50852if you have a 30-pack-year history of smoking and currently smoke or have quit within the past 15 years. Colorectal cancer screening. All adults should have this screening starting at age 50864and continuing until age 68 Your health care provider may recommend screening at age 9159if you are at increased risk. You will have tests every 1-10 years, depending on your results and the type of screening test. Diabetes screening. This is done by checking your blood sugar (glucose) after you have not eaten for a while (fasting). You may have this done every 1-3 years. Mammogram. This may be done every 1-2 years. Talk with your health care provider about how often you  should have regular mammograms. Abdominal aortic aneurysm (AAA) screening. You may need this if you are a current or former smoker. BRCA-related cancer screening. This may be done if you have a family history of breast, ovarian, tubal, or peritoneal cancers. Other tests STD (sexually transmitted disease) testing, if you are  at risk. Bone density scan. This is done to screen for osteoporosis. You may have this done starting at age 26. Talk with your health care provider about your test results, treatment options,and if necessary, the need for more tests. Follow these instructions at home: Eating and drinking  Eat a diet that includes fresh fruits and vegetables, whole grains, lean protein, and low-fat dairy products. Limit your intake of foods with high amounts of sugar, saturated fats, and salt. Take vitamin and mineral supplements as recommended by your health care provider. Do not drink alcohol if your health care provider tells you not to drink. If you drink alcohol: Limit how much you have to 0-1 drink a day. Be aware of how much alcohol is in your drink. In the [place] one drink equals one 12 oz bottle of beer (355 mL), one 5 oz glass of wine (148 mL), or one 1 oz glass of hard liquor (44 mL).  Lifestyle Take daily care of your teeth and gums. Brush your teeth every morning and night with fluoride toothpaste. Floss one time each day. Stay active. Exercise for at least 30 minutes 5 or more days each week. Do not use any products that contain nicotine or tobacco, such as cigarettes, e-cigarettes, and chewing tobacco. If you need help quitting, ask your health care provider. Do not use drugs. If you are sexually active, practice safe sex. Use a condom or other form of protection in order to prevent STIs (sexually transmitted infections). Talk with your health care provider about taking a low-dose aspirin or statin. Find healthy ways to cope with stress, such as: Meditation, yoga, or listening to music. Journaling. Talking to a trusted person. Spending time with friends and family. Safety Always wear your seat belt while driving or riding in a vehicle. Do not drive: If you have been drinking alcohol. Do not ride with someone who has been drinking. When you are tired or distracted. While texting. Wear a  helmet and other protective equipment during sports activities. If you have firearms in your house, make sure you follow all gun safety procedures. What's next? Visit your health care provider once a year for an annual wellness visit. Ask your health care provider how often you should have your eyes and teeth checked. Stay up to date on all vaccines. This information is not intended to replace advice given to you by your health care provider. Make sure you discuss any questions you have with your healthcare provider. Document Revised: 06/01/2020 Document Reviewed: 06/05/2018 Elsevier Patient Education  2022 [employer].  Our clinic's number is [phone]. Please call with questions or concerns about what we discussed today.

## 2020-12-08 NOTE — Progress Notes (Addendum)
Subjective:   Jenna Wong is a 70 y.o. female who presents for Medicare Annual (Subsequent) preventive examination.  Review of Systems: Defer to PCP.  Cardiac Risk Factors include: advanced age (>50mn, >>53women);diabetes mellitus;obesity (BMI >30kg/m2);smoking/ tobacco exposure  Objective:   Vitals: Ht '5\' 7"'  (1.702 m)   Wt 205 lb (93 kg)   BMI 32.11 kg/m   Body mass index is 32.11 kg/m.  Advanced Directives 12/08/2020 06/27/2020 02/17/2020 12/14/2019 12/08/2019 10/22/2019 09/18/2019  Does Patient Have a Medical Advance Directive? No No No No No No No  Would patient like information on creating a medical advance directive? Yes (MAU/Ambulatory/Procedural Areas - Information given) No - Patient declined No - Patient declined No - Patient declined Yes (MAU/Ambulatory/Procedural Areas - Information given) No - Patient declined No - Patient declined   Tobacco Social History   Tobacco Use  Smoking Status Every Day   Packs/day: 1.00   Years: 30.00   Pack years: 30.00   Types: Cigarettes  Smokeless Tobacco Never  Tobacco Comments   thinking about it     Ready to quit: Yes Counseling given: Yes Tobacco comments: thinking about it  Clinical Intake:  Pre-visit preparation completed: Yes  Pain Score: 9   How often do you need to have someone help you when you read instructions, pamphlets, or other written materials from your doctor or pharmacy?: 2 - Rarely What is the last grade level you completed in school?: High School  Interpreter Needed?: No  Past Medical History:  Diagnosis Date   Abnormal mammogram 12/00   repeat normal   Abscess breast 7/80   Anxiety    Breast abscess of female 07/18/2011   Cervical disc displacement    diagnosed by MRI--sees dr CCyndy Freeze evaluation by disability determination services for permanent disability 04/25/00   Depression    Diabetes mellitus without complication ([facility]    Dizziness    Mental and behavioral problems with learning     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   Spinal stenosis    MRI LS spine 9/09   SPINAL STENOSIS, LUMBAR 06/22/2008   Past Surgical History:  Procedure Laterality Date   ABDOMINAL HYSTERECTOMY     BREAST CYST ASPIRATION Left    MYOMECTOMY     THYROID LOBECTOMY N/A 12/14/2019   Procedure: LEFT THYROID LOBECTOMY;  Surgeon: GArmandina Gemma MD;  Location: [facility];  Service: General;  Laterality: N/A;   Family History  Problem Relation Age of Onset   Parkinsonism Brother    Dementia Brother    Hypertension Mother    Hypertension Father    Depression Sister    Diabetes Sister    Stroke Sister    Breast cancer Sister    Social History   Socioeconomic History   Marital status: Single    Spouse name: Not on file   Number of children: 0   Years of education: 12   Highest education level: Not on file  Occupational History   Occupation: sEngineer, maintenance (IT) OTHER    Comment: short term disability for LBP 11/2008 for lumbar stenosis and unable to perform physically demanding job  Tobacco Use   Smoking status: Every Day    Packs/day: 1.00    Years: 30.00    Pack years: 30.00    Types: Cigarettes   Smokeless tobacco: Never   Tobacco comments:    thinking about it  Vaping Use   Vaping Use: Never used  Substance and Sexual Activity   Alcohol  use: Yes    Comment: 1 x per month   Drug use: No   Sexual activity: Not Currently    Birth control/protection: Other-see comments    Comment: has had hysterectomy- no sexual partner x 2 years  Other Topics Concern   Not on file  Social History Narrative   Patient lives with her sister in [place].    Patient has a dog named honey.    Patient walks honey ~5 days per week for exercise.    Patient is active in her church.    Patient enjoys gardening, watching ID tv, reading her bible, and weekly bible studies.             Social Determinants of Health   Financial Resource Strain: Low Risk    Difficulty of Paying Living Expenses: Not hard at all   Food Insecurity: No Food Insecurity   Worried About Charity fundraiser in the Last Year: Never true   [place] in the Last Year: Never true  Transportation Needs: No Transportation Needs   Lack of Transportation (Medical): No   Lack of Transportation (Non-Medical): No  Physical Activity: Sufficiently Active   Days of Exercise per Week: 5 days   Minutes of Exercise per Session: 30 min  Stress: Stress Concern Present   Feeling of Stress : To some extent  Social Connections: Moderately Integrated   Frequency of Communication with Friends and Family: More than three times a week   Frequency of Social Gatherings with Friends and Family: More than three times a week   Attends Religious Services: More than 4 times per year   Active Member of [employer] or Organizations: Yes   Attends Archivist Meetings: More than 4 times per year   Marital Status: Never married   Outpatient Encounter Medications as of 12/08/2020  Medication Sig   albuterol (PROAIR HFA) 108 (90 Base) MCG/ACT inhaler Inhale 2 puffs into the lungs every 6 (six) hours as needed for wheezing or shortness of breath.   aspirin 81 MG tablet Take 81 mg by mouth daily.   atorvastatin (LIPITOR) 40 MG tablet Take 1 tablet by mouth once daily   Blood Glucose Monitoring Suppl (ONETOUCH VERIO FLEX SYSTEM) w/Device KIT USE AS DIRECTED   fluticasone (FLONASE) 50 MCG/ACT nasal spray Place 1 spray into both nostrils daily. 1 spray in each nostril every day (Patient taking differently: Place 1 spray into both nostrils daily as needed for allergies.)   Lancet Devices (ONE TOUCH DELICA LANCING DEV) MISC 1 applicator by Does not apply route every morning.   Lancets (ONETOUCH DELICA PLUS ZOXWRU04V) MISC USE 1 LANCET TO CHECK GLUCOSE ONCE DAILY IN THE MORNING   meclizine (ANTIVERT) 25 MG tablet TAKE 1 TABLET BY MOUTH THREE TIMES DAILY AS NEEDED FOR DIZZINESS   ONETOUCH VERIO test strip USE AS DIRECTED TO  CHECK  BLOOD  SUGAR   ONETOUCH  VERIO test strip USE STRIPS 4 TIMES PER DAY, ONCE IN THE MORNING FASTING, AND THEN BEFORE EACH MEAL   sertraline (ZOLOFT) 50 MG tablet TAKE 1 TABLET BY MOUTH IN THE MORNING AND EVERY DAY AT BEDTIME   TRULICITY 4.09 WJ/1.9JY SOPN INJECT 0.75 MG INTO THE SKIN ONCE A WEEK   vitamin B-12 (CYANOCOBALAMIN) 1000 MCG tablet Take 1,000 mcg by mouth daily.   baclofen (LIORESAL) 10 MG tablet Take 1 tablet (10 mg total) by mouth 3 (three) times daily. (Patient not taking: Reported on 12/08/2020)   metFORMIN (GLUCOPHAGE) 1000  MG tablet Take 1 tablet (1,000 mg total) by mouth 2 (two) times daily with a meal. (Patient not taking: Reported on 12/08/2020)   ondansetron (ZOFRAN) 4 MG tablet Take 1 tablet (4 mg total) by mouth every 8 (eight) hours as needed for nausea. (Patient not taking: No sig reported)   oxyCODONE (OXY IR/ROXICODONE) 5 MG immediate release tablet Take 1-2 tablets (5-10 mg total) by mouth every 4 (four) hours as needed for moderate pain. (Patient not taking: Reported on 12/08/2020)   polyethylene glycol powder (GLYCOLAX/MIRALAX) 17 GM/SCOOP powder Take 17 g by mouth daily. (Patient not taking: No sig reported)   No facility-administered encounter medications on file as of 12/08/2020.   Activities of Daily Living In your present state of health, do you have any difficulty performing the following activities: 12/08/2020 12/14/2019  Hearing? N N  Vision? N N  Difficulty concentrating or making decisions? N N  Walking or climbing stairs? N N  Dressing or bathing? N N  Doing errands, shopping? N N  Preparing Food and eating ? N -  Using the Toilet? N -  In the past six months, have you accidently leaked urine? N -  Do you have problems with loss of bowel control? N -  Managing your Medications? N -  Managing your Finances? N -  Housekeeping or managing your Housekeeping? N -  Some recent data might be hidden   Patient Care Team: Eulis Foster, MD as PCP - General (Family  Medicine) Juanita Craver, MD (Gastroenterology)    Assessment:   This is a routine wellness examination for Andreanna.  Exercise Activities and Dietary recommendations Current Exercise Habits: Home exercise routine, Type of exercise: walking, Time (Minutes): 30, Frequency (Times/Week): 5, Weekly Exercise (Minutes/Week): 150, Intensity: Mild, Exercise limited by: respiratory conditions(s);psychological condition(s)   Goals      Quit Smoking     Current 1PPD (11/2020)        Fall Risk Fall Risk  12/08/2020 06/27/2020 10/22/2019 06/09/2019 12/31/2018  Falls in the past year? 0 0 0 0 0  Number falls in past yr: 0 0 0 - -  Injury with Fall? - 0 0 - -  Risk for fall due to : No Fall Risks - - - -  Follow up Falls prevention discussed - - - -   Is the patient's home free of loose throw rugs in walkways, pet beds, electrical cords, etc?   yes      Grab bars in the bathroom? yes      Handrails on the stairs?   yes      Adequate lighting?   yes  Patient rating of health (0-10) scale: 8  Depression Screen PHQ 2/9 Scores 12/08/2020 10/03/2020 06/27/2020 05/26/2020  PHQ - 2 Score '2 2 1 ' 0  PHQ- 9 Score '7 9 11 15    ' Cognitive Function MMSE - Mini Mental State Exam 04/14/2012  Orientation to time 5  Orientation to Place 5  Registration 3  Attention/ Calculation 1  Recall 2  Language- name 2 objects 2  Language- repeat 1  Language- follow 3 step command 3  Language- read & follow direction 1  Write a sentence 0  Copy design 1  Total score 24   6CIT Screen 12/08/2020  What Year? 0 points  What month? 0 points  What time? 0 points  Count back from 20 0 points  Months in reverse 0 points  Repeat phrase 0 points  Total Score 0   Immunization  History  Administered Date(s) Administered   Fluad Quad(high Dose 65+) 06/27/2020   Influenza Split 04/10/2011, 03/13/2012   Influenza Whole 07/04/2009, 04/04/2010   Influenza, High Dose Seasonal PF 04/12/2019   Influenza,inj,Quad PF,6+ Mos 03/11/2013,  06/06/2015, 08/03/2016, 05/03/2017, 04/21/2018   PFIZER(Purple Top)SARS-COV-2 Vaccination 08/07/2019, 09/01/2019, 04/28/2020   PPD Test 04/10/2011, 04/10/2013   Pneumococcal Polysaccharide-23 04/12/2019   Td 02/24/2003   Zoster Recombinat (Shingrix) 04/12/2019   Screening Tests Health Maintenance  Topic Date Due   OPHTHALMOLOGY EXAM  Never done   DEXA SCAN  Never done   Zoster Vaccines- Shingrix (2 of 2) 06/07/2019   FOOT EXAM  12/31/2019   PNA vac Low Risk Adult (2 of 2 - PCV13) 04/11/2020   COVID-19 Vaccine (4 - Booster for Pfizer series) 08/26/2020   HEMOGLOBIN A1C  12/25/2020   INFLUENZA VACCINE  01/23/2021   URINE MICROALBUMIN  06/27/2021   COLONOSCOPY (Pts 45-57yr Insurance coverage will need to be confirmed)  11/28/2021   MAMMOGRAM  09/14/2022   TETANUS/TDAP  07/09/2023   Hepatitis C Screening  Completed   HPV VACCINES  Aged Out   Cancer Screenings: Lung: Low Dose CT Chest recommended if Age 551-80years, 30 pack-year currently smoking OR have quit w/in 15years. Patient does qualify. Breast:  Up to date on Mammogram? Yes   Up to date of Bone Density/Dexa? No Colorectal: UTD- next due 11/2021  Additional Screenings: Hepatitis C Screening: Completed   Plan:  PCP apt scheduled for 7/18. Plan to get covid booster at PCP apt.  Bring vaccine card so we can update.  Fill out advance directive packet I gave you.   I have personally reviewed and noted the following in the patient's chart:   Medical and social history Use of alcohol, tobacco or illicit drugs  Current medications and supplements Functional ability and status Nutritional status Physical activity Advanced directives List of other physicians Hospitalizations, surgeries, and ER visits in previous 12 months Vitals Screenings to include cognitive, depression, and falls Referrals and appointments  In addition, I have reviewed and discussed with patient certain preventive protocols, quality metrics, and best  practice recommendations. A written personalized care plan for preventive services as well as general preventive health recommendations were provided to patient.  EDorna Bloom [place] 12/08/2020    I have reviewed this visit and agree with the documentation.  CDorris Singh MD  Family Medicine Teaching Service

## 2020-12-09 NOTE — Progress Notes (Signed)
Received notification from [place] regarding approval for TRULICITY 0.75MG /0.5ML. Patient assistance approved from 12/09/20 to 06/24/21.  Medication will ship a 3-4 month supply to pt's home.  Phone: [phone]

## 2020-12-14 ENCOUNTER — Other Ambulatory Visit: Payer: Self-pay | Admitting: Family Medicine

## 2020-12-14 DIAGNOSIS — E118 Type 2 diabetes mellitus with unspecified complications: Secondary | ICD-10-CM

## 2020-12-23 ENCOUNTER — Telehealth: Payer: Self-pay

## 2020-12-23 DIAGNOSIS — E118 Type 2 diabetes mellitus with unspecified complications: Secondary | ICD-10-CM

## 2020-12-23 MED ORDER — ONETOUCH VERIO VI STRP: ORAL_STRIP | 3 refills | Status: DC

## 2020-12-23 NOTE — Telephone Encounter (Signed)
A rep from [employer] calls nurse line to check the status of application. The application was faxed back to [employer] on 6/16, however rep reports they never received it. I confirmed the fax number and she stated that was wrong. I printed the application off and faxed to [facility] cares with number provided.

## 2020-12-23 NOTE — Telephone Encounter (Signed)
Patient calls nurse line reporting insurance is not covering test strips 4x per day. Patient reports her insurance will only cover 3x per day. I have updated the prescription and resent to her preferred pharmacy.

## 2021-01-08 NOTE — Assessment & Plan Note (Addendum)
Lipid panel today  Continue atorvastatin 40mg 

## 2021-01-08 NOTE — Progress Notes (Signed)
    SUBJECTIVE:   CHIEF COMPLAINT / HPI: DM Management   T2DM  Diabetes Current Regimen: Trulicity 0.75mg  weekly, has not been taking metformin  CBGs: max of 180 after a party, ranging from 120s to 130s otherwise Last A1c:  Lab Results  Component Value Date   HGBA1C 6.9 (A) 06/27/2020   Denies polyuria, polydipsia, hypoglycemia Last Eye Exam: 2022 Statin: atorvastatin 40mg , agreeable to lipid panel today    HTN  No current antihypertensive medications. BP WNL today.  Health Maintenance Covid Vaccine:will wait  recommended 4th dose  PNA Vaccine: will get today  DM Foot Exam:completed today  Eye Exam: last exam earlier in 2022  DEXA Scan: ordered today  Printed script for Shingrix vaccine   PERTINENT  PMH / PSH:  T2DM  HLD  HTN  Hx of CVA  Arthritis   OBJECTIVE:   BP 119/72   Pulse 72   Ht 5' 7.5" (1.715 m)   Wt 207 lb 9.6 oz (94.2 kg)   SpO2 99%   BMI 32.03 kg/m   General: female appearing stated age in no acute distress Cardio: Normal S1 and S2, no S3 or S4. Rhythm is regular. No murmurs or rubs.  Bilateral radial pulses palpable Pulm: Clear to auscultation bilaterally, no crackles, wheezing, or diminished breath sounds. Normal respiratory effort, stable on RA Abdomen: Bowel sounds normal. Abdomen soft and non-tender.  Extremities: No peripheral edema. Warm/ well perfused.   ASSESSMENT/PLAN:   Postmenopausal estrogen deficiency DEXA bone scan ordered   Hyperlipidemia Lipid panel today  Continue atorvastatin 40mg    Essential hypertension Controlled. No antihypertensives indicated today.  CMP   Diabetes ([place]) Hgb S1U  trulicity weekly  Has not been taking metformin  Will restart if A1c has increased from previous    Healthcare maintenance DEXA scan ordered  PNA vaccine ordered today  Foot exam completed today for hx of DM  A1c completed today  Declines covid booster at this time   Eulis Foster, MD [place]

## 2021-01-08 NOTE — Assessment & Plan Note (Signed)
DEXA bone scan ordered

## 2021-01-09 ENCOUNTER — Other Ambulatory Visit: Payer: Self-pay

## 2021-01-09 ENCOUNTER — Ambulatory Visit (INDEPENDENT_AMBULATORY_CARE_PROVIDER_SITE_OTHER): Payer: Medicare HMO | Admitting: Family Medicine

## 2021-01-09 ENCOUNTER — Encounter: Payer: Self-pay | Admitting: Family Medicine

## 2021-01-09 ENCOUNTER — Telehealth: Payer: Self-pay

## 2021-01-09 ENCOUNTER — Other Ambulatory Visit: Payer: Self-pay | Admitting: Family Medicine

## 2021-01-09 VITALS — BP 119/72 | HR 72 | Ht 67.5 in | Wt 207.6 lb

## 2021-01-09 DIAGNOSIS — E119 Type 2 diabetes mellitus without complications: Secondary | ICD-10-CM

## 2021-01-09 DIAGNOSIS — Z0000 Encounter for general adult medical examination without abnormal findings: Secondary | ICD-10-CM

## 2021-01-09 DIAGNOSIS — E785 Hyperlipidemia, unspecified: Secondary | ICD-10-CM

## 2021-01-09 DIAGNOSIS — Z780 Asymptomatic menopausal state: Secondary | ICD-10-CM | POA: Diagnosis not present

## 2021-01-09 DIAGNOSIS — I10 Essential (primary) hypertension: Secondary | ICD-10-CM

## 2021-01-09 DIAGNOSIS — Z23 Encounter for immunization: Secondary | ICD-10-CM

## 2021-01-09 DIAGNOSIS — R42 Dizziness and giddiness: Secondary | ICD-10-CM

## 2021-01-09 LAB — POCT GLYCOSYLATED HEMOGLOBIN (HGB A1C): HbA1c, POC (controlled diabetic range): 6.8 % (ref 0.0–7.0)

## 2021-01-09 MED ORDER — ONDANSETRON HCL 4 MG PO TABS: 4.0000 mg | ORAL_TABLET | Freq: Three times a day (TID) | ORAL | 0 refills | Status: DC | PRN

## 2021-01-09 MED ORDER — OXYCODONE HCL 5 MG PO TABS
5.0000 mg | ORAL_TABLET | ORAL | 0 refills | Status: DC | PRN
Start: 2021-01-09 — End: 2021-01-09

## 2021-01-09 MED ORDER — OXYCODONE HCL 5 MG PO TABS: 5.0000 mg | ORAL_TABLET | Freq: Four times a day (QID) | ORAL | 0 refills | Status: AC | PRN

## 2021-01-09 MED ORDER — MECLIZINE HCL 25 MG PO TABS: ORAL_TABLET | ORAL | 0 refills | Status: DC

## 2021-01-09 MED ORDER — ZOSTER VAC RECOMB ADJUVANTED 50 MCG/0.5ML IM SUSR: 0.5000 mL | Freq: Once | INTRAMUSCULAR | 0 refills | Status: AC

## 2021-01-09 NOTE — Assessment & Plan Note (Signed)
Hgb J8S  trulicity weekly  Has not been taking metformin  Will restart if A1c has increased from previous

## 2021-01-09 NOTE — Patient Instructions (Signed)
It was a pleasure to see you today!  Thank you for choosing [facility] for your primary care.   Jenna Wong was seen for diabetes and medication refills.   Our plans for today were: I have refilled your medications as requested.  I have placed orders for your shingles vaccine as well as bone density scan.  We will collect blood work today to check your cholesterol, diabetes and electrolytes. I will notify you of abnormal results.    You should return to our clinic in 3 months for diabetes and cholesterol follow up.   Best Wishes,   Dr. Alba Cory

## 2021-01-09 NOTE — Addendum Note (Signed)
Addended by: Leonia Corona R on: 01/09/2021 04:05 PM   Modules accepted: Orders

## 2021-01-09 NOTE — Telephone Encounter (Signed)
RX updated to 1 tablet every 6 hours as needed.

## 2021-01-09 NOTE — Assessment & Plan Note (Signed)
Controlled. No antihypertensives indicated today.  CMP

## 2021-01-09 NOTE — Telephone Encounter (Signed)
Received phone call from pharmacist regarding issues with oxycodone rx. Per guidelines, MME is greater than what is recommended. Pharmacist is requesting to change directions for patient to take 1 tablet by mouth every 4 hours as needed for moderate pain.   Please advise.   Talbot Grumbling, RN

## 2021-01-09 NOTE — Assessment & Plan Note (Signed)
DEXA scan ordered  PNA vaccine ordered today  Foot exam completed today for hx of DM  A1c completed today  Declines covid booster at this time

## 2021-01-10 LAB — LIPID PANEL
Chol/HDL Ratio: 3.4 ratio (ref 0.0–4.4)
Cholesterol, Total: 136 mg/dL (ref 100–199)
HDL: 40 mg/dL (ref >39)
LDL Chol Calc (NIH): 80 mg/dL (ref 0–99)
Triglycerides: 83 mg/dL (ref 0–149)
VLDL Cholesterol Cal: 16 mg/dL (ref 5–40)

## 2021-01-10 LAB — COMPREHENSIVE METABOLIC PANEL
ALT: 21 IU/L (ref 0–32)
AST: 23 IU/L (ref 0–40)
Albumin/Globulin Ratio: 1.8 (ref 1.2–2.2)
Albumin: 4.6 g/dL (ref 3.8–4.8)
Alkaline Phosphatase: 73 IU/L (ref 44–121)
BUN/Creatinine Ratio: 17 (ref 12–28)
BUN: 15 mg/dL (ref 8–27)
Bilirubin Total: 0.5 mg/dL (ref 0.0–1.2)
CO2: 24 mmol/L (ref 20–29)
Calcium: 9.7 mg/dL (ref 8.7–10.3)
Chloride: 102 mmol/L (ref 96–106)
Creatinine, Ser: 0.87 mg/dL (ref 0.57–1.00)
Globulin, Total: 2.5 g/dL (ref 1.5–4.5)
Glucose: 104 mg/dL — ABNORMAL HIGH (ref 65–99)
Potassium: 4.2 mmol/L (ref 3.5–5.2)
Sodium: 138 mmol/L (ref 134–144)
Total Protein: 7.1 g/dL (ref 6.0–8.5)
eGFR: 73 mL/min/{1.73_m2} (ref >59)

## 2021-01-12 ENCOUNTER — Encounter: Payer: Self-pay | Admitting: Family Medicine

## 2021-01-18 ENCOUNTER — Other Ambulatory Visit: Payer: Self-pay | Admitting: *Deleted

## 2021-01-18 MED ORDER — SERTRALINE HCL 50 MG PO TABS: ORAL_TABLET | ORAL | 1 refills | Status: DC

## 2021-02-08 ENCOUNTER — Other Ambulatory Visit: Payer: Self-pay | Admitting: Family Medicine

## 2021-02-13 ENCOUNTER — Other Ambulatory Visit: Payer: Self-pay | Admitting: Family Medicine

## 2021-03-24 IMAGING — US US THYROID
1 series · 13 of 25 positions shown · non-contrast
Comparison: CT 10/28/2019

CLINICAL DATA: 68-year-old female with a history of thyroid nodules
on CT

EXAM:
THYROID ULTRASOUND
TECHNIQUE: Ultrasound examination of the thyroid gland and adjacent soft
tissues was performed.

[Series 1: us thyroid · 13 of 76 slices shown]
[im 1/76]
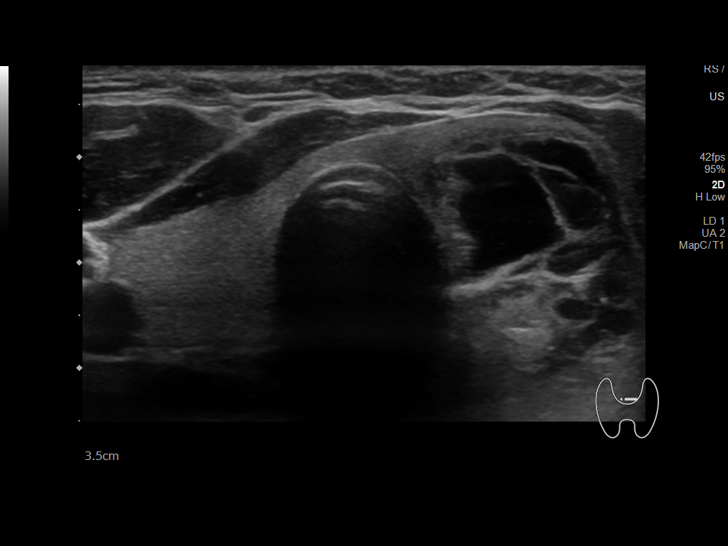
[im 7/76]
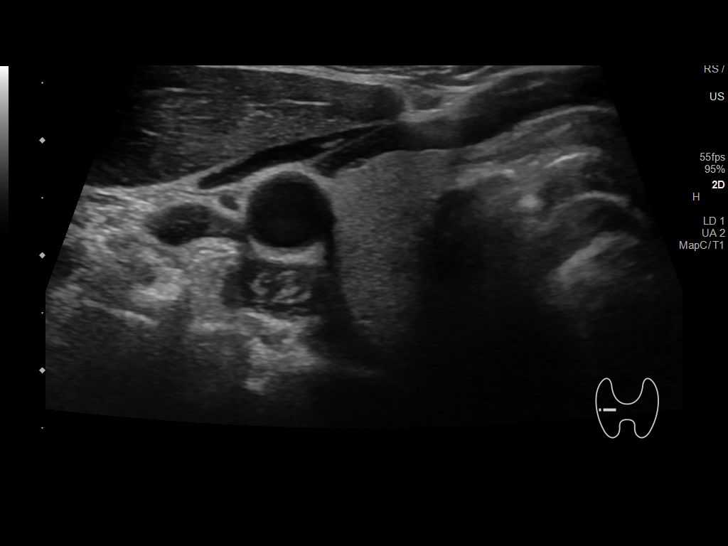
[im 13/76]
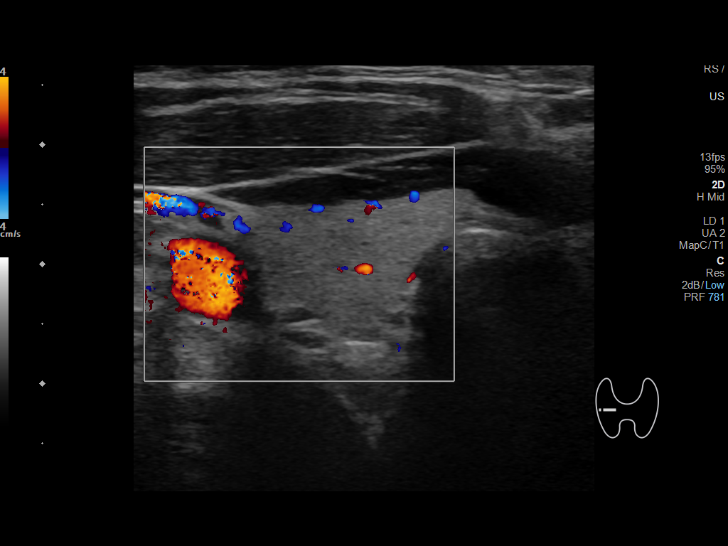
[im 19/76]
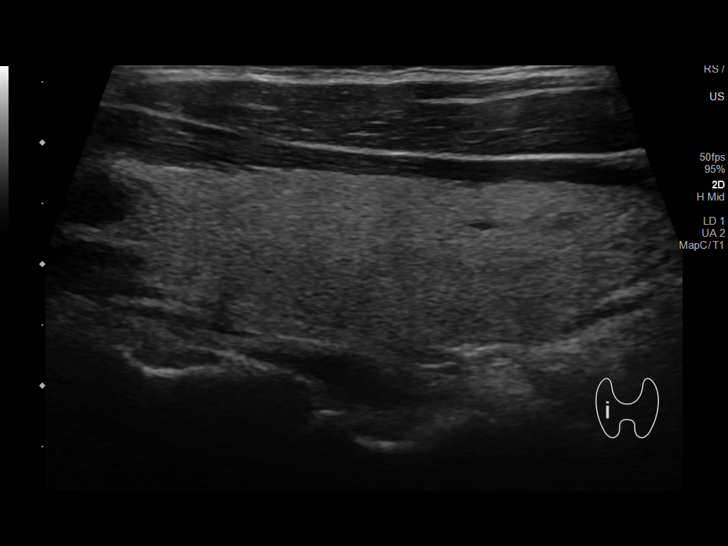
[im 26/76]
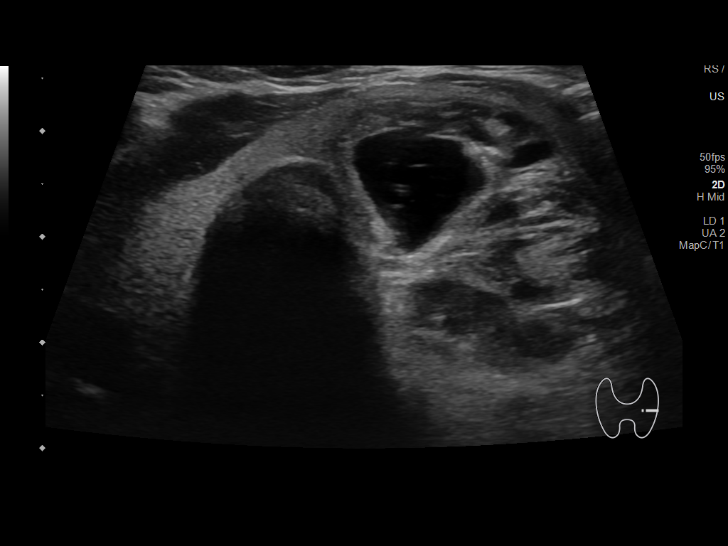
[im 32/76]
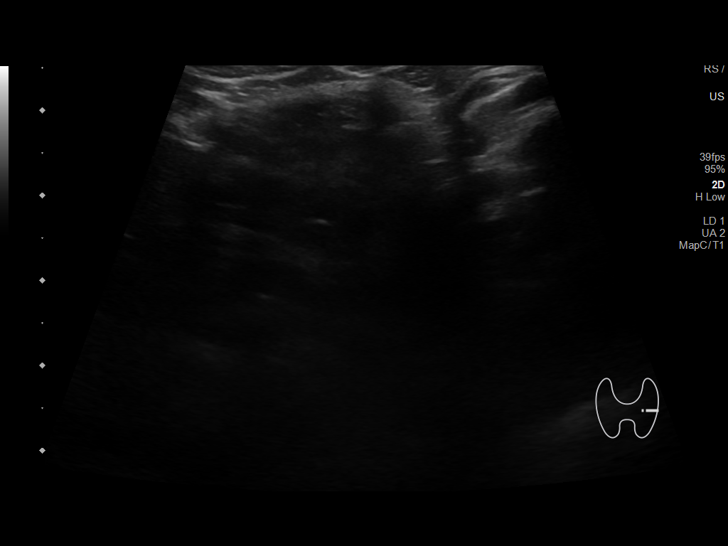
[im 38/76]
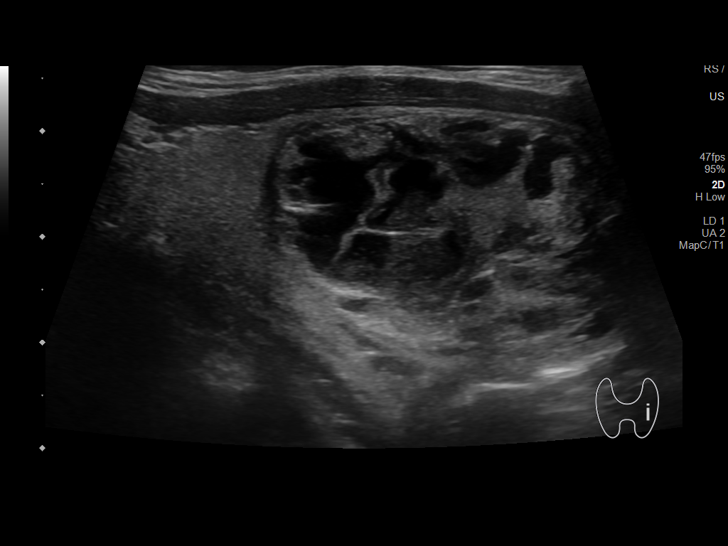
[im 44/76]
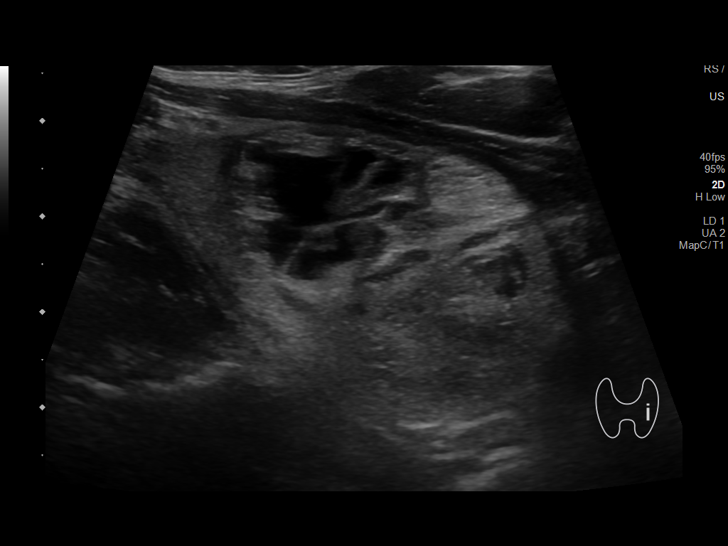
[im 51/76]
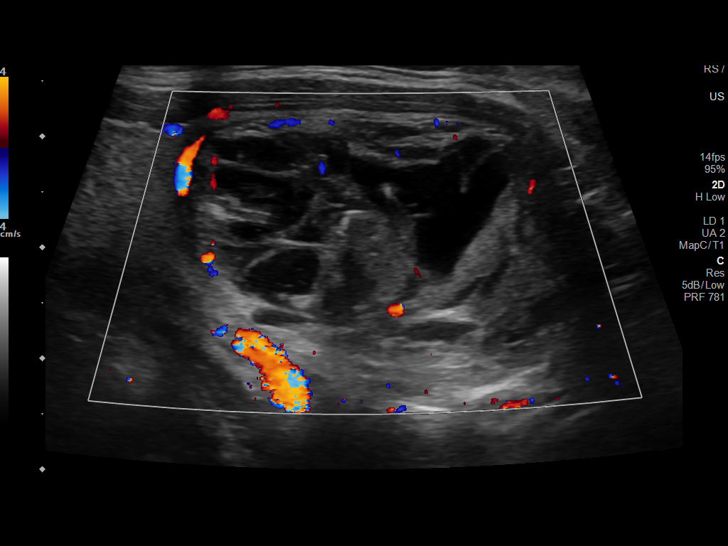
[im 57/76]
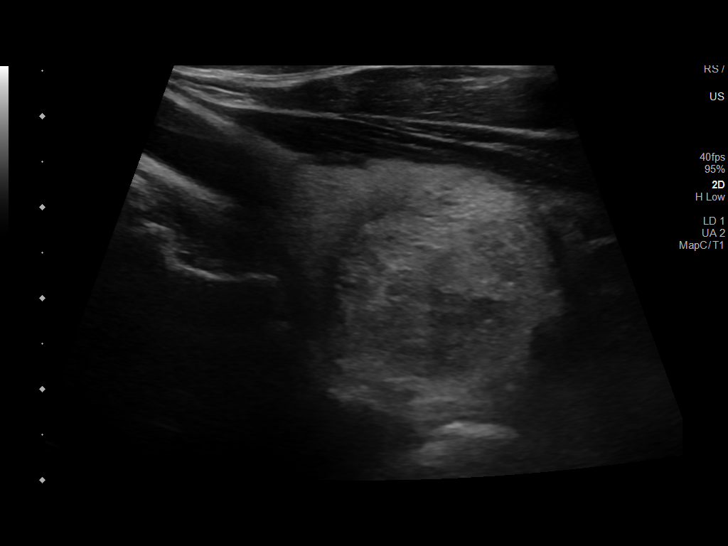
[im 63/76]
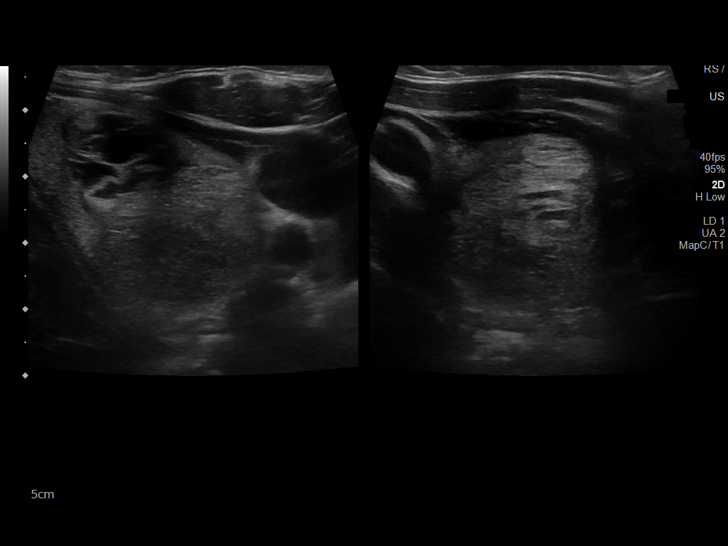
[im 69/76]
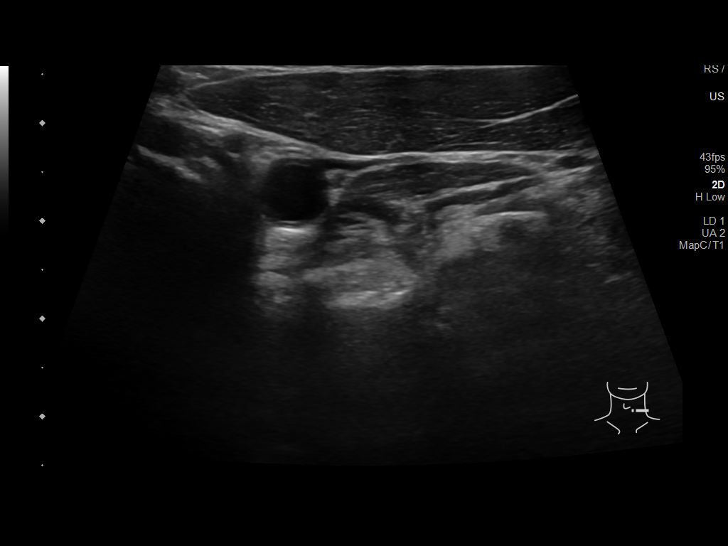
[im 76/76]
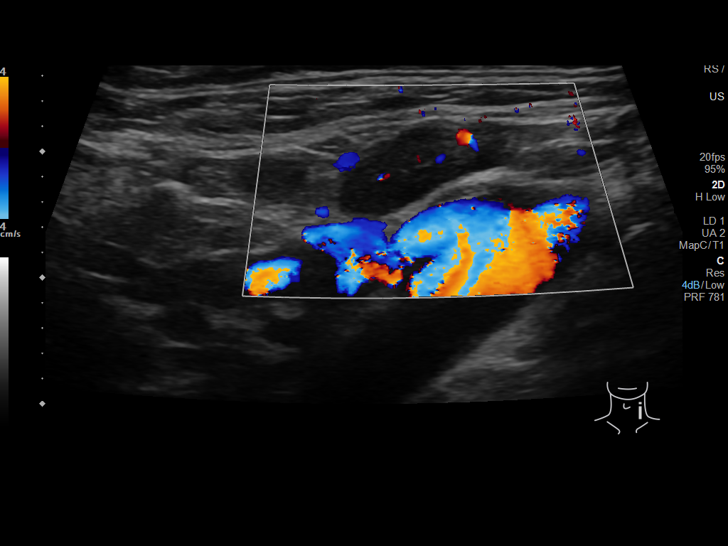

[13 of 25 positions shown; findings below may reference images not displayed]

FINDINGS: Parenchymal Echotexture: Mildly heterogenous

Isthmus: 0.3 cm

Right lobe: 5.3 cm x 1.3 cm x 1.6 cm

Left lobe: 5.3 cm x 2.7 cm x 3.6 cm

_________________________________________________________

Estimated total number of nodules >/= 1 cm: 2

Number of spongiform nodules >/=  2 cm not described below (TR1): 0

Number of mixed cystic and solid nodules >/= 1.5 cm not described
below (TR2): 0

_________________________________________________________

Nodule # 1:

Location: Left; Superior

Maximum size: 3.7 cm; Other 2 dimensions: 3.4 cm x 2.5 cm

Composition: mixed cystic and solid (1)

Echogenicity: isoechoic (1)

Shape: not taller-than-wide (0)

Margins: ill-defined (0)

Echogenic foci: none (0)

ACR TI-RADS total points: 2.

ACR TI-RADS risk category: TR2 (2 points).

ACR TI-RADS recommendations:

Cystic nodule does not meet criteria for surveillance or biopsy

_________________________________________________________

Nodule # 2:

Location: Left; Inferior

Maximum size: 2.6 cm; Other 2 dimensions: 2.5 cm x 2.2 cm

Composition: solid/almost completely solid (2)

Echogenicity: isoechoic (1)

Shape: not taller-than-wide (0)

Margins: ill-defined (0)

Echogenic foci: none (0)

ACR TI-RADS total points: 3.

ACR TI-RADS risk category: TR3 (3 points).

ACR TI-RADS recommendations:

Nodule meets criteria for biopsy

_________________________________________________________

Lymph nodes present with typical architecture maintained.
IMPRESSION: Multinodular left-sided thyroid.

Left inferior thyroid nodule (labeled 2, TR 3, 2.6 cm) meets
criteria for biopsy, as designated by the newly established ACR
TI-RADS criteria, and referral for biopsy is recommended.

Recommendations follow those established by the new ACR TI-RADS
criteria ([HOSPITAL] 8239;[DATE]).

## 2021-04-10 ENCOUNTER — Ambulatory Visit (INDEPENDENT_AMBULATORY_CARE_PROVIDER_SITE_OTHER): Payer: Medicare HMO | Admitting: Family Medicine

## 2021-04-10 ENCOUNTER — Other Ambulatory Visit: Payer: Self-pay

## 2021-04-10 ENCOUNTER — Encounter: Payer: Self-pay | Admitting: Family Medicine

## 2021-04-10 DIAGNOSIS — R4589 Other symptoms and signs involving emotional state: Secondary | ICD-10-CM

## 2021-04-10 HISTORY — DX: Other symptoms and signs involving emotional state: R45.89

## 2021-04-10 MED ORDER — SERTRALINE HCL 100 MG PO TABS: 100.0000 mg | ORAL_TABLET | Freq: Every day | ORAL | 1 refills | Status: DC

## 2021-04-10 NOTE — Patient Instructions (Signed)
I have increased your Zoloft dose to 100 mg daily.  Please plan to follow-up with me in 1 month or sooner as needed.

## 2021-04-10 NOTE — Assessment & Plan Note (Signed)
Increased Zoloft to 100mg  daily  PHQ-9 score 12 today  Follow up in 1 month

## 2021-04-10 NOTE — Progress Notes (Signed)
    SUBJECTIVE:   CHIEF COMPLAINT / HPI: f/u depression symptoms   Patient reports that she is feeling well.  She reports that she would like to increase her Zoloft dose from 50 mg to 100 mg.  She is already increased 100 mg and reports feeling better as far as her leg symptoms as well as her mood.  She denies suicidal homicidal ideation.  Patient states that she does miss her nieces and wishes they would visit more often but she has communicated this with them.  Patient reports feeling "well" today.  PERTINENT  PMH / PSH:  HTN  DM   OBJECTIVE:   BP 118/80   Pulse 99   Ht 5' 7.5" (1.715 m)   Wt 207 lb (93.9 kg)   SpO2 98%   BMI 31.94 kg/m   General: female appearing stated age in no acute distress Cardio: Normal S1 and S2, no S3 or S4. Rhythm is regular. No murmurs or rubs.  Bilateral radial pulses palpable Pulm: Clear to auscultation bilaterally, no crackles, wheezing, or diminished breath sounds. Normal respiratory effort, stable on room air Abdomen: Bowel sounds normal. Abdomen soft and non-tender. Extremities: No peripheral edema. Warm/ well perfused.    ASSESSMENT/PLAN:   Depressed mood Increased Zoloft to 100mg  daily  PHQ-9 score 12 today  Follow up in 1 month      Eulis Foster, MD [place]

## 2021-05-02 ENCOUNTER — Telehealth: Payer: Self-pay | Admitting: Family Medicine

## 2021-05-02 NOTE — Telephone Encounter (Signed)
Patient dropped off forms at front desk for completion.  Verified that patient section of form has been completed.  Last DOS with PCP was 04/10/2021.  Placed form in Red team folder to be completed by clinical staff.  [place] Silos

## 2021-05-03 NOTE — Telephone Encounter (Signed)
Reviewed form and placed in PCP's box for completion.  .Ghazi Rumpf R Davyn Morandi, CMA  

## 2021-05-09 NOTE — Progress Notes (Signed)
Received RE ENROLLMENT notification from [employer] regarding approval for TRULICITY 0.75MG /0.5ML. Patient assistance approved from 06/25/21 to 06/24/22.  Phone: [phone]

## 2021-05-10 NOTE — Telephone Encounter (Signed)
Form completed and placed in front office folder. Ready for patient pick up.   Eulis Foster, MD [place], PGY-3 [phone]

## 2021-05-11 NOTE — Telephone Encounter (Signed)
Form placed up front for pickup and a copy was made for batch scanning.   I attempted to call patient, however no answer or option for VM.   Please let her know her form is ready if she calls.

## 2021-06-21 ENCOUNTER — Other Ambulatory Visit: Payer: Self-pay

## 2021-06-21 ENCOUNTER — Ambulatory Visit (INDEPENDENT_AMBULATORY_CARE_PROVIDER_SITE_OTHER): Payer: Medicare HMO | Admitting: Student

## 2021-06-21 ENCOUNTER — Encounter: Payer: Self-pay | Admitting: Student

## 2021-06-21 ENCOUNTER — Ambulatory Visit (INDEPENDENT_AMBULATORY_CARE_PROVIDER_SITE_OTHER): Payer: Medicare HMO

## 2021-06-21 VITALS — BP 113/84 | HR 103 | Ht 67.0 in | Wt 209.2 lb

## 2021-06-21 DIAGNOSIS — Z23 Encounter for immunization: Secondary | ICD-10-CM | POA: Diagnosis not present

## 2021-06-21 DIAGNOSIS — E119 Type 2 diabetes mellitus without complications: Secondary | ICD-10-CM | POA: Diagnosis not present

## 2021-06-21 LAB — POCT GLYCOSYLATED HEMOGLOBIN (HGB A1C): HbA1c, POC (controlled diabetic range): 7.1 % — AB (ref 0.0–7.0)

## 2021-06-21 NOTE — Patient Instructions (Addendum)
It was great to see you! Thank you for allowing me to participate in your care!  I recommend that you always bring your medications to each appointment as this makes it easy to ensure you are on the correct medications and helps [place] not miss when refills are needed.  Our plans for today:  - Please return in 6 months for another follow up visit -Please see your ophthalmologist for your yearly eye exam in the near future   Take care and seek immediate care sooner if you develop any concerns.   Dr. Precious Gilding, DO [facility]

## 2021-06-21 NOTE — Progress Notes (Addendum)
° ° °  SUBJECTIVE:   CHIEF COMPLAINT / HPI:   T2DM Last HgbA1c from 06/27/20 was 6.9. Pt takes trulicity weekly. Pt states her blood sugars have been running high, in the high 100's to low 200's. She drinks a coke zero on occasion but mostly water. Doesn't eat much fried food. Eats [place] a lot and microwave veggies.    PERTINENT  PMH / PSH: T2DM, HLD, depression and anxiety  OBJECTIVE:   Vitals:   06/21/21 1518  BP: 113/84  Pulse: (!) 103  SpO2: 97%     General: NAD, pleasant, able to participate in exam Cardiac: RRR, no murmurs. Respiratory: CTAB, normal effort, No wheezes, rales or rhonchi Extremities: no edema or cyanosis. Neuro: alert, no obvious focal deficits Psych: Normal affect and mood  ASSESSMENT/PLAN:   T2DM HgbA1c today of 7.1.  -Continue Trulicity weekly -Follow-up in 6 months for repeat A1c  -Patient received flu vaccine and COVID booster today Dr. Precious Gilding, [place]

## 2021-07-04 ENCOUNTER — Telehealth: Payer: Self-pay | Admitting: *Deleted

## 2021-07-04 NOTE — Telephone Encounter (Signed)
Patient called and said that Dr. Dominica Severin Rankin's office needs a referral before they will see her.  Will forward to MD. Johnney Ou

## 2021-07-05 ENCOUNTER — Other Ambulatory Visit: Payer: Self-pay | Admitting: Student

## 2021-07-05 DIAGNOSIS — E119 Type 2 diabetes mellitus without complications: Secondary | ICD-10-CM

## 2021-07-05 NOTE — Progress Notes (Signed)
Referral to Dr. Zadie Rhine for diabetic eye exam placed.

## 2021-07-11 ENCOUNTER — Other Ambulatory Visit: Payer: Medicare HMO

## 2021-08-10 ENCOUNTER — Ambulatory Visit (INDEPENDENT_AMBULATORY_CARE_PROVIDER_SITE_OTHER): Payer: Medicare HMO | Admitting: Family Medicine

## 2021-08-10 ENCOUNTER — Other Ambulatory Visit: Payer: Self-pay

## 2021-08-10 ENCOUNTER — Encounter: Payer: Self-pay | Admitting: Family Medicine

## 2021-08-10 DIAGNOSIS — R42 Dizziness and giddiness: Secondary | ICD-10-CM

## 2021-08-10 DIAGNOSIS — E119 Type 2 diabetes mellitus without complications: Secondary | ICD-10-CM

## 2021-08-10 DIAGNOSIS — E118 Type 2 diabetes mellitus with unspecified complications: Secondary | ICD-10-CM | POA: Diagnosis not present

## 2021-08-10 MED ORDER — FLUTICASONE PROPIONATE 50 MCG/ACT NA SUSP: 1.0000 | Freq: Every day | NASAL | 12 refills | Status: AC

## 2021-08-10 MED ORDER — METFORMIN HCL 1000 MG PO TABS: 1000.0000 mg | ORAL_TABLET | Freq: Two times a day (BID) | ORAL | 3 refills | Status: DC

## 2021-08-10 MED ORDER — MECLIZINE HCL 25 MG PO TABS: ORAL_TABLET | ORAL | 0 refills | Status: DC

## 2021-08-10 NOTE — Progress Notes (Signed)
° ° ° °  SUBJECTIVE:   CHIEF COMPLAINT / HPI:   Jenna Wong is a 71 y.o. female presents for concerns for high CBGs   Diabetes Patient's current diabetic medications include trulicity. Has not taking metformin for a few months as her PCP is out on leave and she didn't know if she could get a refill. Fasting CBGs 160-200s. She is worried about some of the high readings. Patient's last A1c was 7.1. Not excessively eating sugary foods.  Lab Results  Component Value Date   HGBA1C 7.1 (A) 06/21/2021   HGBA1C 6.8 01/09/2021   HGBA1C 6.9 (A) 06/27/2020  Denies abdominal pain, blurred vision, polyuria, polydipsia, hypoglycemia. Patient states they understand that diet and exercise can help with her diabetes.  Last Microalbumin, LDL, Creatinine: Lab Results  Component Value Date   MICROALBUR 10 06/27/2020   LDLCALC 80 01/09/2021   CREATININE 0.87 01/09/2021    Flowsheet Row Office Visit from 08/10/2021 in [place]  PHQ-9 Total Score 6       PERTINENT  PMH / PSH: DM, HTN   OBJECTIVE:   BP 121/73    Pulse 92    Ht 5\' 7"  (1.702 m)    Wt 212 lb 12.8 oz (96.5 kg)    SpO2 99%    BMI 33.33 kg/m    General: Alert, no acute distress, pleasant  Cardio: Normal S1 and S2, RRR, no r/m/g Pulm: CTAB, normal work of breathing Abdomen: Bowel sounds normal. Abdomen soft and non-tender.  Extremities: No peripheral edema.  Neuro: Cranial nerves grossly intact   ASSESSMENT/PLAN:   Diabetes ([place]) Discussed CBGs with pt. Some are high in 200s but overall reassuring. Refilled Metformin for pt. Also reassured her that her DM is at goal, she does not need to check her CBGS daily. Declined diabetic foot exam.      Lattie Haw, MD PGY-3 [place]

## 2021-08-10 NOTE — Patient Instructions (Signed)
Thank you for coming to see me today. It was a pleasure. Today we discussed your blood sugars, they are a little high but not too much. I recommend restarting metformin. You dont need to check your sugars daily-maybe 2-3 times a week max   Refilled your meds  Please follow-up in 1 month for diabetic check  If you have any questions or concerns, please do not hesitate to call the office at [phone].  Best wishes,   Dr Posey Pronto

## 2021-08-11 NOTE — Assessment & Plan Note (Addendum)
Discussed CBGs with pt. Some are high in 200s but overall reassuring. Refilled Metformin for pt. Also reassured her that her DM is at goal, she does not need to check her CBGS daily. Declined diabetic foot exam.

## 2021-09-20 ENCOUNTER — Other Ambulatory Visit: Payer: Self-pay | Admitting: Family Medicine

## 2021-09-20 DIAGNOSIS — Z1231 Encounter for screening mammogram for malignant neoplasm of breast: Secondary | ICD-10-CM

## 2021-09-26 ENCOUNTER — Ambulatory Visit
Admission: RE | Admit: 2021-09-26 | Discharge: 2021-09-26 | Disposition: A | Discharge status: Home / Self Care | Payer: Medicare HMO | Source: Ambulatory Visit | Attending: Family Medicine | Admitting: Family Medicine

## 2021-09-26 DIAGNOSIS — Z1231 Encounter for screening mammogram for malignant neoplasm of breast: Secondary | ICD-10-CM

## 2021-11-10 ENCOUNTER — Ambulatory Visit (INDEPENDENT_AMBULATORY_CARE_PROVIDER_SITE_OTHER): Payer: Medicare HMO | Admitting: Family Medicine

## 2021-11-10 ENCOUNTER — Encounter: Payer: Self-pay | Admitting: Family Medicine

## 2021-11-10 VITALS — BP 128/78 | HR 80 | Wt 210.0 lb

## 2021-11-10 DIAGNOSIS — E119 Type 2 diabetes mellitus without complications: Secondary | ICD-10-CM | POA: Diagnosis not present

## 2021-11-10 DIAGNOSIS — M79605 Pain in left leg: Secondary | ICD-10-CM

## 2021-11-10 LAB — POCT GLYCOSYLATED HEMOGLOBIN (HGB A1C): HbA1c, POC (controlled diabetic range): 6.8 % (ref 0.0–7.0)

## 2021-11-10 NOTE — Patient Instructions (Addendum)
It was wonderful to see you today.  Please bring ALL of your medications with you to every visit.   Today we talked about:  -Left leg pain is likely due to a muscle strain.  For this we discussed conservative treatment such as heat, ice, massage, ibuprofen.  If fails to improve over the next 2 weeks please follow-up and we can consider a referral to sports medicine. - Continue current medications for diabetes.  Follow-up in 3 months for next A1c.  Please be sure to schedule follow up at the front  desk before you leave today.   If you haven't already, sign up for My Chart to have easy access to your labs results, and communication with your primary care physician.  Please call the clinic at [phone] if your symptoms worsen or you have any concerns. It was our pleasure to serve you.  Dr. Janus Molder  Hamstring Strain  A hamstring strain happens when the muscles in the back of the thighs (hamstring muscles) are overstretched or torn. The hamstring muscles are used in straightening the hips, bending the knees, and pulling back the legs. This injury is often called a pulled hamstring muscle. The tissue that connects the muscle to a bone (tendon) may also be affected. The severity of a hamstring strain may be rated in degrees or grades. First-degree (or grade 1) strains have the least amount of muscle tearing and pain. Second-degree and third-degree (grade 2 and 3) strains have increasingly more tearing and pain. What are the causes? This condition is caused by a sudden, violent force being placed on the hamstring muscles, stretching them too far. This often happens during activities that involve sprinting, jumping, kicking, or weight lifting. What increases the risk? Hamstring strains are especially common in athletes. The following factors may also make you more likely to develop this condition: Having low strength, endurance, or flexibility of the hamstring muscles. Doing high-impact  physical activity or sports. Having poor physical fitness. Having a previous leg injury. Having tired (fatigued) muscles. Having a previous hamstring strain. What are the signs or symptoms? Symptoms of this condition include: Pain in the back of the thigh or buttocks. Swelling. Bruising. Muscle spasms. Trouble moving the affected muscle because of pain. For severe strains, you may feel popping or snapping in the back of your thigh when the injury occurs. How is this diagnosed? This condition is diagnosed based on your symptoms, your medical history, and a physical exam. You may also have imaging tests, such as an MRI or X-rays. Your strain may be rated based on how severe it is. The ratings are: Grade 1 strain (mild). Muscles are overstretched. There may be very small muscle tears. Grade 2 strain (moderate). Muscles are partially torn. Grade 3 strain (severe). Muscles are completely torn. How is this treated? Treatment for this condition usually involves: Protecting, resting, icing, applying compression, and elevating the injured area (PRICE therapy). Medicines. Your health care provider may recommend medicines to help reduce pain or inflammation. Doing exercises to regain strength and flexibility in the muscles. Your health care provider will tell you when it is okay to begin exercising. Hamstring strains may take a long time to heal. This type of strain may happen again in athletes. Follow your health care provider's advice about when to return to sports-related activities. Follow these instructions at home: PRICE therapy Use PRICE therapy to promote muscle healing during the first 2-3 days after your injury, or as told by your health care provider. Protect  the muscle from being injured again. Rest your injury. This usually involves limiting your normal activities and not using the injured hamstring muscle. Talk with your health care provider about how you should limit your  activities. Apply ice to the injured area: Put ice in a plastic bag. Place a towel between your skin and the bag. Leave the ice on for 20 minutes, 2-3 times a day. After the third day, switch to applying heat as told. Put pressure (compression) on your injured hamstring by wrapping it with an elastic bandage. Be careful not to wrap it too tightly. That may interfere with blood circulation or may increase swelling. Raise (elevate) your injured hamstring above the level of your heart as often as possible. When you are lying down, you can do this by putting a pillow under your thigh.  Activity Begin exercising or stretching only as told by your health care provider. Do not return to full activity level until your health care provider approves. To help prevent muscle strains in the future, always warm up before exercising and stretch afterward. General instructions Take over-the-counter and prescription medicines only as told by your health care provider. If directed, apply heat to the affected area as often as told by your health care provider. Use the heat source that your health care provider recommends, such as a moist heat pack or a heating pad. Place a towel between your skin and the heat source. Leave the heat on for 20-30 minutes. Remove the heat if your skin turns bright red. This is especially important if you are unable to feel pain, heat, or cold. You may have a greater risk of getting burned. Keep all follow-up visits. This is important. Contact a health care provider if: You have increasing pain or swelling in the injured area. You have numbness, tingling, or a significant loss of strength in the injured area. Get help right away if: Your foot or your toes become cold or turn blue. Summary A hamstring strain happens when the muscles in the back of the thighs (hamstring muscles) are overstretched or torn. This injury can be caused by a sudden, violent force being placed on the  hamstring muscles, causing them to stretch too far. Symptoms include pain, swelling, and muscle spasms in the injured area. Treatment includes PRICE therapy: protecting, resting, icing, applying compression, and elevating the injured area. This information is not intended to replace advice given to you by your health care provider. Make sure you discuss any questions you have with your health care provider. Document Revised: 11/10/2020 Document Reviewed: 11/10/2020 Elsevier Patient Education  [place].

## 2021-11-10 NOTE — Progress Notes (Signed)
    SUBJECTIVE:   CHIEF COMPLAINT / HPI:   Left leg pain [place] when coming downstairs carrying laundry 1 week ago. Fell onto left buttock and leg. Aggravated when standing and she has attempted tylenol for relief.   Diabetes Current Regimen: Metformin 3790 mg BID, trulicity 2.40 mg weekly CBGs: checking 1-2 x daily  Last A1c: 7.1 on 06/21/2021  Denies polyuria, polydipsia, hypoglycemia Last Eye Exam: Needs to schedule Statin: Lipitor 40 mg daily ACE/ARB: n/a  PERTINENT  PMH / PSH: HTN, lumbar stenosis  OBJECTIVE:   BP 128/78   Pulse 80   Wt 210 lb (95.3 kg)   BMI 32.89 kg/m   General: Appears well, no acute distress. Age appropriate. Respiratory: normal effort Extremities: Left hip/knee  - Inspection: No palpable gross deformity - Palpation: No tenderness over greater trochanter or bony prominences but tender along the span of the posterior thigh - ROM: Normal range of motion on Flexion, extension, abduction, internal and external rotation b/l - Strength: 4/5 strength with resisted flexion at the knee otherwise 5/5 in all other directions - Neuro/vasc: NV intact distally b/l - Special Tests: Negative FABER and FADIR b/l. Negative Trendelenberg. Neuro: alert and oriented Psych: normal affect  ASSESSMENT/PLAN:   1. Left leg pain Acute. X1 week 2/2 fall on left side coming down the stairs. She did not hit her head or lose consciousness. Did not hurt at time of the event but continued to worsen. Localized to posterior of left thigh area. No palpable evidence of rupture at this time. Walking and able to get on exam table. Likely muscle strain due to fall mechanism. Conservative treatment discussed in AVS.   2. Type 2 diabetes mellitus without complication, without long-term current use of insulin (HCC) A1c 6.8 today. Well controlled. Continue current regimen. Consider decreasing metformin at 3 month follow up.  - HgB A1c  Gerlene Fee, [place]

## 2021-11-13 ENCOUNTER — Emergency Department (HOSPITAL_COMMUNITY): Payer: Medicare HMO

## 2021-11-13 ENCOUNTER — Emergency Department (HOSPITAL_COMMUNITY)
Admission: EM | Admit: 2021-11-13 | Discharge: 2021-11-13 | Disposition: A | Discharge status: Home / Self Care | Payer: Medicare HMO | Attending: Emergency Medicine | Admitting: Emergency Medicine

## 2021-11-13 ENCOUNTER — Encounter (HOSPITAL_COMMUNITY): Payer: Self-pay

## 2021-11-13 DIAGNOSIS — M25552 Pain in left hip: Secondary | ICD-10-CM | POA: Diagnosis present

## 2021-11-13 DIAGNOSIS — Z7982 Long term (current) use of aspirin: Secondary | ICD-10-CM | POA: Insufficient documentation

## 2021-11-13 DIAGNOSIS — Z79899 Other long term (current) drug therapy: Secondary | ICD-10-CM | POA: Diagnosis not present

## 2021-11-13 MED ORDER — NAPROXEN 375 MG PO TABS
375.0000 mg | ORAL_TABLET | Freq: Two times a day (BID) | ORAL | 0 refills | Status: DC
Start: 2021-11-13 — End: 2022-03-29

## 2021-11-13 MED ORDER — LIDOCAINE 5 % EX PTCH
1.0000 | MEDICATED_PATCH | CUTANEOUS | Status: DC
  Administered 2021-11-13: 1 via TRANSDERMAL
  Filled 2021-11-13: qty 1

## 2021-11-13 MED ORDER — TIZANIDINE HCL 4 MG PO CAPS: 4.0000 mg | ORAL_CAPSULE | Freq: Three times a day (TID) | ORAL | 0 refills | Status: DC | PRN

## 2021-11-13 MED ORDER — TIZANIDINE HCL 4 MG PO TABS
4.0000 mg | ORAL_TABLET | Freq: Once | ORAL | Status: AC
  Administered 2021-11-13: 4 mg via ORAL
  Filled 2021-11-13: qty 1

## 2021-11-13 NOTE — ED Notes (Signed)
Patient left before discharge process could be completed.  

## 2021-11-13 NOTE — ED Provider Notes (Signed)
[facility] DEPT Provider Note   CSN: [id number] Arrival date & time: 11/13/21  1726     History  Chief Complaint  Patient presents with   Fall    Jenna Wong is a 71 y.o. female with medical history of depression, dizziness, spinal stenosis, anxiety, cervical disc displacement.  Patient presents ED for evaluation of left-sided hip pain that began after fall.  Patient reports that 1 week ago, she fell after getting her feet tangled in the rug.  Patient states she fell onto her left side and was evaluated by her PCP at this time.  PCP diagnosed her with hamstring strain and advised her to do at home conservative management techniques.  Patient states that today, she was going down some stairs on a deck outside when she missed her step and fell again onto her left side.  The patient denies hitting her head, losing consciousness, being on blood thinners.  Patient reports increased left hip pain which prompted her to present to ED for evaluation.  Patient denies any neck pain, fevers, nausea, vomiting, abdominal pain, chest pain, shortness of breath.  Patient denies back pain, states that the pain is localized into her left hip near her glutes.   Fall Pertinent negatives include no chest pain, no abdominal pain and no shortness of breath.      Home Medications Prior to Admission medications   Medication Sig Start Date End Date Taking? Authorizing Provider  naproxen (NAPROSYN) 375 MG tablet Take 1 tablet (375 mg total) by mouth 2 (two) times daily. 11/13/21  Yes Azucena Cecil, PA-C  tiZANidine (ZANAFLEX) 4 MG capsule Take 1 capsule (4 mg total) by mouth 3 (three) times daily as needed for muscle spasms. 11/13/21  Yes Azucena Cecil, PA-C  albuterol (VENTOLIN HFA) 108 (90 Base) MCG/ACT inhaler INHALE 2 PUFFS BY MOUTH EVERY 6 HOURS AS NEEDED FOR WHEEZING FOR SHORTNESS OF BREATH 02/09/21   Autry-Lott, Naaman Plummer, DO  aspirin 81 MG tablet Take 81 mg by  mouth daily.    [provider]  atorvastatin (LIPITOR) 40 MG tablet Take 1 tablet by mouth once daily 02/13/21   Simmons-Robinson, Makiera, MD  Blood Glucose Monitoring Suppl (ONETOUCH VERIO FLEX SYSTEM) w/Device KIT USE AS DIRECTED 09/08/20   Simmons-Robinson, Riki Sheer, MD  fluticasone (FLONASE) 50 MCG/ACT nasal spray Place 1 spray into both nostrils daily. 1 spray in each nostril every day 08/10/21   Lattie Haw, MD  glucose blood (ONETOUCH VERIO) test strip USE STRIPS 3 TIMES PER DAY, ONCE BEFORE EACH MEAL 12/23/20   Simmons-Robinson, Riki Sheer, MD  Lancet Devices (ONE TOUCH DELICA LANCING DEV) MISC 1 applicator by Does not apply route every morning. 10/19/16   Mikell, Jeani Sow, MD  Lancets (ONETOUCH DELICA PLUS [id number]) MISC USE 1 LANCET TO CHECK GLUCOSE ONCE DAILY IN THE MORNING 11/16/20   Simmons-Robinson, Riki Sheer, MD  meclizine (ANTIVERT) 25 MG tablet TAKE 1 TABLET BY MOUTH THREE TIMES DAILY AS NEEDED FOR DIZZINESS 08/10/21   Lattie Haw, MD  metFORMIN (GLUCOPHAGE) 1000 MG tablet Take 1 tablet (1,000 mg total) by mouth 2 (two) times daily with a meal. 08/10/21   Lattie Haw, MD  ondansetron (ZOFRAN) 4 MG tablet Take 1 tablet (4 mg total) by mouth every 8 (eight) hours as needed for nausea. 01/09/21   Simmons-Robinson, Riki Sheer, MD  sertraline (ZOLOFT) 100 MG tablet Take 1 tablet (100 mg total) by mouth daily. 04/10/21   Simmons-Robinson, Makiera, MD  TRULICITY 7.09 GG/8.3MO SOPN INJECT 0.75  MG INTO THE SKIN ONCE A WEEK 10/19/20   Simmons-Robinson, Riki Sheer, MD  vitamin B-12 (CYANOCOBALAMIN) 1000 MCG tablet Take 1,000 mcg by mouth daily.    [provider]      Allergies    Patient has no known allergies.    Review of Systems   Review of Systems  Constitutional:  Negative for fever.  Respiratory:  Negative for shortness of breath.   Cardiovascular:  Negative for chest pain.  Gastrointestinal:  Negative for abdominal pain, nausea and vomiting.  Musculoskeletal:  Positive  for arthralgias and myalgias. Negative for back pain and neck pain.  Neurological:  Negative for syncope.  All other systems reviewed and are negative.  Physical Exam Updated Vital Signs BP 118/74 (BP Location: Left Arm)   Pulse 88   Temp 98 F (36.7 C) (Oral)   Resp 16   SpO2 95%  Physical Exam Vitals and nursing note reviewed.  Constitutional:      General: She is not in acute distress.    Appearance: Normal appearance. She is not ill-appearing, toxic-appearing or diaphoretic.  HENT:     Head: Normocephalic and atraumatic.     Nose: Nose normal. No congestion.     Mouth/Throat:     Mouth: Mucous membranes are moist.     Pharynx: Oropharynx is clear.  Eyes:     Extraocular Movements: Extraocular movements intact.     Conjunctiva/sclera: Conjunctivae normal.     Pupils: Pupils are equal, round, and reactive to light.  Cardiovascular:     Rate and Rhythm: Normal rate and regular rhythm.  Pulmonary:     Effort: Pulmonary effort is normal.     Breath sounds: Normal breath sounds.  Abdominal:     General: Abdomen is flat.     Palpations: Abdomen is soft.     Tenderness: There is no abdominal tenderness.  Musculoskeletal:     Cervical back: Normal range of motion and neck supple. No tenderness.     Right hip: Normal.     Left hip: Tenderness present. No deformity or lacerations. Normal range of motion.     Comments: No obvious crepitus, deformity noted on visual inspection of patient left hip.  There is diffuse tenderness on palpation. Patient has appropriate range of motion   Skin:    General: Skin is warm and dry.     Capillary Refill: Capillary refill takes less than 2 seconds.  Neurological:     General: No focal deficit present.     Mental Status: She is alert and oriented to person, place, and time.     GCS: GCS eye subscore is 4. GCS verbal subscore is 5. GCS motor subscore is 6.     Cranial Nerves: Cranial nerves 2-12 are intact. No cranial nerve deficit.      Sensory: Sensation is intact. No sensory deficit.     Motor: Motor function is intact. No weakness.     Coordination: Coordination is intact. Heel to [facility] Test normal.     Comments: 5 out of 5 strength left lower extremity    ED Results / Procedures / Treatments   Labs (all labs ordered are listed, but only abnormal results are displayed) Labs Reviewed - No data to display  EKG None  Radiology DG Hip Unilat W or Wo Pelvis 2-3 Views Left  Result Date: 11/13/2021 CLINICAL DATA:  Status post fall.  Left hip pain EXAM: DG HIP (WITH OR WITHOUT PELVIS) 2-3V LEFT COMPARISON:  None Available. FINDINGS: There  is no evidence of hip fracture or dislocation. Mild bilateral and symmetric degenerative changes noted within both hip joints. There is no evidence of other focal bone abnormality. IMPRESSION: 1. No acute findings. 2. Mild bilateral hip osteoarthritis. Electronically Signed   By: Kerby Moors M.D.   On: 11/13/2021 18:45    Procedures Procedures    Medications Ordered in ED Medications  lidocaine (LIDODERM) 5 % 1 patch (1 patch Transdermal Patch Applied 11/13/21 1832)  tiZANidine (ZANAFLEX) tablet 4 mg (4 mg Oral Given 11/13/21 1831)    ED Course/ Medical Decision Making/ A&P                           Medical Decision Making Amount and/or Complexity of Data Reviewed Radiology: ordered.  Risk Prescription drug management.   71 year old female presents to ED for evaluation.  Please see HPI for further details.  On examination, the patient has diffuse tenderness to her left hip.  The patient denies any back pain.  Patient is afebrile, nontachycardic.  Patient nonhypoxic on room air with clear lung sounds bilaterally.  Patient abdomen soft and compressible in all 4 quadrants.  Patient alert and orient x3.  Patient follows command spontaneously.  Patient worked up utilizing plain film imaging of left hip which does not show any signs of acute fracture or dislocation.  Bilateral  osteoarthritis was noted.  Patient provided with lidocaine patch, Zanaflex tablet, ibuprofen.  Patient reports that he feels slightly better after these medications were given.  At this time, the patient is stable for discharge.  The patient has been encouraged to follow back up with her PCP for further management.  Patient will be discharged home with Zanaflex as well as naproxen.  Patient given return precautions to include GI bleed.  Patient had all of her questions answered to her satisfaction.  The patient is stable this time for discharge home.   Final Clinical Impression(s) / ED Diagnoses Final diagnoses:  Pain of left hip    Rx / DC Orders ED Discharge Orders          Ordered    naproxen (NAPROSYN) 375 MG tablet  2 times daily        11/13/21 1925    tiZANidine (ZANAFLEX) 4 MG capsule  3 times daily PRN        11/13/21 1925              Lawana Chambers 11/13/21 1956    Regan Lemming, MD 11/14/21 [phone]

## 2021-11-13 NOTE — ED Triage Notes (Signed)
Pt arrived via POV, c/o mechanical fall today, left leg pain since.

## 2021-11-13 NOTE — Discharge Instructions (Addendum)
Return to the ED with any new symptoms such as fevers, blood in stool, abdominal pain, shortness of breath or chest pain These follow-up with your PCP for ongoing management of your osteoarthritis of bilateral hips as well as hip pain Please pick up prescription naproxen that I have sent in for you.  Please also pick up prescription of Zanaflex labs and in for you. Please refer to the attached informational guide concerning hip pain You may also utilize ice and heat packs at home for pain relief as well as Salonpas patches which can be picked up over-the-counter

## 2021-11-16 ENCOUNTER — Other Ambulatory Visit: Payer: Self-pay | Admitting: Family Medicine

## 2021-12-05 ENCOUNTER — Other Ambulatory Visit: Payer: Self-pay | Admitting: Family Medicine

## 2021-12-05 DIAGNOSIS — E118 Type 2 diabetes mellitus with unspecified complications: Secondary | ICD-10-CM

## 2021-12-06 ENCOUNTER — Other Ambulatory Visit: Payer: Self-pay

## 2021-12-06 ENCOUNTER — Ambulatory Visit (INDEPENDENT_AMBULATORY_CARE_PROVIDER_SITE_OTHER): Payer: Medicare HMO | Admitting: Family Medicine

## 2021-12-06 ENCOUNTER — Encounter: Payer: Self-pay | Admitting: Family Medicine

## 2021-12-06 VITALS — BP 145/95 | HR 100 | Wt 207.4 lb

## 2021-12-06 DIAGNOSIS — G5702 Lesion of sciatic nerve, left lower limb: Secondary | ICD-10-CM

## 2021-12-06 DIAGNOSIS — E1149 Type 2 diabetes mellitus with other diabetic neurological complication: Secondary | ICD-10-CM | POA: Diagnosis not present

## 2021-12-06 DIAGNOSIS — I10 Essential (primary) hypertension: Secondary | ICD-10-CM

## 2021-12-06 DIAGNOSIS — M79605 Pain in left leg: Secondary | ICD-10-CM

## 2021-12-06 MED ORDER — AMLODIPINE BESYLATE 5 MG PO TABS: 5.0000 mg | ORAL_TABLET | Freq: Every day | ORAL | 3 refills | Status: DC

## 2021-12-06 MED ORDER — GABAPENTIN 100 MG PO CAPS: 100.0000 mg | ORAL_CAPSULE | Freq: Three times a day (TID) | ORAL | 3 refills | Status: DC

## 2021-12-06 NOTE — Progress Notes (Signed)
    SUBJECTIVE:   CHIEF COMPLAINT / HPI:   Discuss starting gabapentin Desires discussing starting gabapentin.  Had a home health visit and numbers suggest that this medication.  She does have a history of diabetic neuropathy.  She endorses symptoms of tingling and burning at the bottom of both of her feet.  She also has continued left leg pain from a fall prior.  She has started to feel as if the pain is going from her buttock down the back of her leg.  Sitting makes it worse.  She would also like to pursue referral to sports medicine as previously discussed.  She denies additional fall, saddle anesthesia, fever.  A1c at goal.  Hypertension - Medications: N/A, previously well controlled - Checking BP at home: No - Denies any SOB, CP, vision changes, headache  PERTINENT  PMH / PSH: History of bilateral hand numbness, lumbar spinal stenosis  OBJECTIVE:   BP (!) 145/95   Pulse 100   Wt 207 lb 6.4 oz (94.1 kg)   SpO2 100%   BMI 32.48 kg/m   Diabetic foot exam was performed with the following findings:   No deformities, ulcerations, or other skin breakdown Normal sensation of 10g monofilament Intact posterior tibialis and dorsalis pedis pulses    General: Appears well, no acute distress. Age appropriate. Cardiac: RRR, normal heart sounds, no murmurs Respiratory: CTAB, normal effort Extremities: No LE edema. Mild antalgic gait.  Preserved range of motion and strength.  Intact sensation bilaterally. Skin: Warm and dry, no rashes noted Neuro: alert and oriented Psych: normal affect  ASSESSMENT/PLAN:   1. Left leg pain OV 5/19 left leg pain initially due to a fall where she slipped coming down the stairs.  Pain localized at the time to posterior left thigh area now having a shooting pain from buttock down into her foot.  Seen in ED 5/22 hip image with evidence of bilateral OA.  Given naproxen and Zanaflex.  She has attempted the anti-inflammatory but not the muscle relaxer. High  suspicion for possible sciatic nerve pain versus worsening OA.  Patient also has a history of diabetic neuropathy.  I do think gabapentin is appropriate to start at this time.  Patient does desire sports medicine referral at this time. - gabapentin (NEURONTIN) 100 MG capsule; Take 1 capsule (100 mg total) by mouth 3 (three) times daily.  Dispense: 90 capsule; Refill: 3 - Continue anti-inflammatory; do not take gabapentin and Zanaflex - Ambulatory referral to Sports Medicine  2. Essential hypertension Elevated.  Appears to be elevated on prior office visits.  Asymptomatic.  - Start amlodipine 5 mg daily - Follow-up in 2 weeks for blood pressure recheck  3. Other diabetic neurological complication associated with type 2 diabetes mellitus (HCC) - gabapentin (NEURONTIN) 100 MG capsule; Take 1 capsule (100 mg total) by mouth 3 (three) times daily.  Dispense: 90 capsule; Refill: 3  Discussed patient with Dr. Jerelyn Scott Autry-Lott, [place]

## 2021-12-06 NOTE — Patient Instructions (Addendum)
It was wonderful to see you today.  Please bring ALL of your medications with you to every visit.   Today we talked about:  -Starting gabapentin for leg pain and neuropathy.  Call [phone] - Your blood pressure remains elevated we have started a low-dose medication.  Return in 2 weeks to recheck your blood pressure.  Please be sure to schedule follow up at the front  desk before you leave today.   If you haven't already, sign up for My Chart to have easy access to your labs results, and communication with your primary care physician.  Please call the clinic at [phone] if your symptoms worsen or you have any concerns. It was our pleasure to serve you.  Dr. Janus Molder

## 2021-12-20 ENCOUNTER — Ambulatory Visit: Payer: Medicare HMO | Admitting: Sports Medicine

## 2022-01-02 ENCOUNTER — Other Ambulatory Visit: Payer: Self-pay | Admitting: Family Medicine

## 2022-01-03 NOTE — Telephone Encounter (Signed)
Patient calls nurse line x2 checking the status of medication refill.   Please advise.

## 2022-01-11 ENCOUNTER — Emergency Department (HOSPITAL_COMMUNITY)
Admission: EM | Admit: 2022-01-11 | Discharge: 2022-01-11 | Disposition: A | Discharge status: Home / Self Care | Payer: Medicare HMO | Attending: Emergency Medicine | Admitting: Emergency Medicine

## 2022-01-11 ENCOUNTER — Encounter (HOSPITAL_COMMUNITY): Payer: Self-pay | Admitting: Emergency Medicine

## 2022-01-11 DIAGNOSIS — Z7984 Long term (current) use of oral hypoglycemic drugs: Secondary | ICD-10-CM | POA: Insufficient documentation

## 2022-01-11 DIAGNOSIS — Z79899 Other long term (current) drug therapy: Secondary | ICD-10-CM | POA: Diagnosis not present

## 2022-01-11 DIAGNOSIS — Z7982 Long term (current) use of aspirin: Secondary | ICD-10-CM | POA: Diagnosis not present

## 2022-01-11 DIAGNOSIS — S50862A Insect bite (nonvenomous) of left forearm, initial encounter: Secondary | ICD-10-CM | POA: Insufficient documentation

## 2022-01-11 DIAGNOSIS — W57XXXA Bitten or stung by nonvenomous insect and other nonvenomous arthropods, initial encounter: Secondary | ICD-10-CM | POA: Insufficient documentation

## 2022-01-11 LAB — CBC WITH DIFFERENTIAL/PLATELET
Abs Immature Granulocytes: 0.03 K/uL (ref 0.00–0.07)
Basophils Absolute: 0 K/uL (ref 0.0–0.1)
Basophils Relative: 0 %
Eosinophils Absolute: 0.1 K/uL (ref 0.0–0.5)
Eosinophils Relative: 1 %
HCT: 40.9 % (ref 36.0–46.0)
Hemoglobin: 13.9 g/dL (ref 12.0–15.0)
Immature Granulocytes: 0 %
Lymphocytes Relative: 28 %
Lymphs Abs: 3.6 K/uL (ref 0.7–4.0)
MCH: 31.9 pg (ref 26.0–34.0)
MCHC: 34 g/dL (ref 30.0–36.0)
MCV: 93.8 fL (ref 80.0–100.0)
Monocytes Absolute: 0.5 K/uL (ref 0.1–1.0)
Monocytes Relative: 4 %
Neutro Abs: 8.5 K/uL — ABNORMAL HIGH (ref 1.7–7.7)
Neutrophils Relative %: 67 %
Platelets: 244 K/uL (ref 150–400)
RBC: 4.36 MIL/uL (ref 3.87–5.11)
RDW: 13.9 % (ref 11.5–15.5)
WBC: 12.7 K/uL — ABNORMAL HIGH (ref 4.0–10.5)
nRBC: 0 % (ref 0.0–0.2)

## 2022-01-11 LAB — COMPREHENSIVE METABOLIC PANEL
ALT: 24 U/L (ref 0–44)
AST: 25 U/L (ref 15–41)
Albumin: 3.7 g/dL (ref 3.5–5.0)
Alkaline Phosphatase: 62 U/L (ref 38–126)
Anion gap: 8 (ref 5–15)
BUN: 9 mg/dL (ref 8–23)
CO2: 24 mmol/L (ref 22–32)
Calcium: 8.8 mg/dL — ABNORMAL LOW (ref 8.9–10.3)
Chloride: 104 mmol/L (ref 98–111)
Creatinine, Ser: 0.67 mg/dL (ref 0.44–1.00)
GFR, Estimated: >60 mL/min (ref >60)
Glucose, Bld: 190 mg/dL — ABNORMAL HIGH (ref 70–99)
Potassium: 3.3 mmol/L — ABNORMAL LOW (ref 3.5–5.1)
Sodium: 136 mmol/L (ref 135–145)
Total Bilirubin: 0.6 mg/dL (ref 0.3–1.2)
Total Protein: 7.1 g/dL (ref 6.5–8.1)

## 2022-01-11 MED ORDER — FAMOTIDINE 20 MG PO TABS
20.0000 mg | ORAL_TABLET | Freq: Once | ORAL | Status: AC
  Administered 2022-01-11: 20 mg via ORAL
  Filled 2022-01-11: qty 1

## 2022-01-11 MED ORDER — POTASSIUM CHLORIDE CRYS ER 20 MEQ PO TBCR: 40.0000 meq | EXTENDED_RELEASE_TABLET | Freq: Once | ORAL | 0 refills | Status: DC

## 2022-01-11 MED ORDER — IBUPROFEN 200 MG PO TABS
600.0000 mg | ORAL_TABLET | Freq: Once | ORAL | Status: AC
  Administered 2022-01-11: 600 mg via ORAL
  Filled 2022-01-11: qty 3

## 2022-01-11 MED ORDER — DIPHENHYDRAMINE HCL 25 MG PO CAPS
25.0000 mg | ORAL_CAPSULE | Freq: Once | ORAL | Status: AC
Start: 2022-01-11 — End: 2022-01-11
  Administered 2022-01-11: 25 mg via ORAL

## 2022-01-11 MED ORDER — IBUPROFEN 600 MG PO TABS: 600.0000 mg | ORAL_TABLET | Freq: Four times a day (QID) | ORAL | 0 refills | Status: DC | PRN

## 2022-01-11 MED ORDER — DIPHENHYDRAMINE HCL 25 MG PO CAPS
25.0000 mg | ORAL_CAPSULE | Freq: Once | ORAL | Status: DC
  Filled 2022-01-11: qty 1

## 2022-01-11 NOTE — ED Provider Notes (Signed)
[facility] DEPT Provider Note   CSN: [id number] Arrival date & time: 01/11/22  1208     History  Chief Complaint  Patient presents with   Insect Bite    Jenna Wong is a 71 y.o. female.  HPI   71 y.o. female presents emergency department with complaints of bee sting.  Patient states that the sting occurred yesterday afternoon.  She used tweezers to remove the stinger stung on the anterior aspect of left arm.  She did notice increasing swelling and redness in affected area.  She states that the area has been itching since she has been stung.  She has not tried any medication for this.  Denies feelings of throat closing in on her, difficulty swallowing, difficulty breathing, fever.  Home Medications Prior to Admission medications   Medication Sig Start Date End Date Taking? Authorizing Provider  ibuprofen (ADVIL) 600 MG tablet Take 1 tablet (600 mg total) by mouth every 6 (six) hours as needed. 01/11/22  Yes Dion Saucier A, PA  potassium chloride SA (KLOR-CON M) 20 MEQ tablet Take 2 tablets (40 mEq total) by mouth once for 1 dose. 01/11/22 01/11/22 Yes Dion Saucier A, PA  albuterol (VENTOLIN HFA) 108 (90 Base) MCG/ACT inhaler INHALE 2 PUFFS BY MOUTH EVERY 6 HOURS AS NEEDED FOR WHEEZING FOR SHORTNESS OF BREATH 02/09/21   Autry-Lott, Naaman Plummer, DO  amLODipine (NORVASC) 5 MG tablet Take 1 tablet (5 mg total) by mouth at bedtime. 12/06/21   Autry-Lott, Naaman Plummer, DO  aspirin 81 MG tablet Take 81 mg by mouth daily.    [provider]  atorvastatin (LIPITOR) 40 MG tablet Take 1 tablet by mouth once daily 11/16/21   Simmons-Robinson, Makiera, MD  Blood Glucose Monitoring Suppl ([place]) w/Device KIT USE AS DIRECTED 09/08/20   Simmons-Robinson, Riki Sheer, MD  fluticasone (FLONASE) 50 MCG/ACT nasal spray Place 1 spray into both nostrils daily. 1 spray in each nostril every day 08/10/21   Lattie Haw, MD  gabapentin (NEURONTIN) 100 MG  capsule Take 1 capsule (100 mg total) by mouth 3 (three) times daily. 12/06/21   Autry-Lott, Naaman Plummer, DO  glucose blood (ONETOUCH VERIO) test strip USE STRIPS 3 TIMES PER DAY, ONCE BEFORE EACH MEAL 12/23/20   Simmons-Robinson, Riki Sheer, MD  Lancet Devices (ONE TOUCH DELICA LANCING DEV) MISC 1 applicator by Does not apply route every morning. 10/19/16   Mikell, Jeani Sow, MD  Lancets (ONETOUCH DELICA PLUS [id number]) MISC USE 1 LANCET TO CHECK GLUCOSE ONCE DAILY IN THE MORNING 11/16/20   Simmons-Robinson, Riki Sheer, MD  meclizine (ANTIVERT) 25 MG tablet TAKE 1 TABLET BY MOUTH THREE TIMES DAILY AS NEEDED FOR DIZZINESS 08/10/21   Lattie Haw, MD  metFORMIN (GLUCOPHAGE) 1000 MG tablet TAKE 1 TABLET BY MOUTH TWICE DAILY WITH  A  MEAL 12/06/21   Simmons-Robinson, Riki Sheer, MD  naproxen (NAPROSYN) 375 MG tablet Take 1 tablet (375 mg total) by mouth 2 (two) times daily. 11/13/21   Azucena Cecil, PA-C  ondansetron (ZOFRAN) 4 MG tablet Take 1 tablet (4 mg total) by mouth every 8 (eight) hours as needed for nausea. 01/09/21   Simmons-Robinson, Riki Sheer, MD  sertraline (ZOLOFT) 100 MG tablet Take 1 tablet (100 mg total) by mouth daily. Take 1 tablet (100 mg total) by mouth nightly. 01/03/22   Lowry Ram, MD  tiZANidine (ZANAFLEX) 4 MG capsule Take 1 capsule (4 mg total) by mouth 3 (three) times daily as needed for muscle spasms. 11/13/21   Azucena Cecil, PA-C  TRULICITY 0.96 GE/3.6OQ SOPN INJECT 0.75 MG INTO THE SKIN ONCE A WEEK 10/19/20   Simmons-Robinson, Makiera, MD  vitamin B-12 (CYANOCOBALAMIN) 1000 MCG tablet Take 1,000 mcg by mouth daily.    [provider]      Allergies    Patient has no known allergies.    Review of Systems   Review of Systems  Skin:        Bee sting with swelling and redness  All other systems reviewed and are negative.   Physical Exam Updated Vital Signs BP 134/76   Pulse 79   Temp 97.6 F (36.4 C) (Oral)   Resp 16   SpO2 96%  Physical Exam Vitals and  nursing note reviewed.  Constitutional:      General: She is not in acute distress.    Appearance: She is well-developed.  HENT:     Head: Normocephalic and atraumatic.  Eyes:     Conjunctiva/sclera: Conjunctivae normal.  Cardiovascular:     Rate and Rhythm: Normal rate and regular rhythm.     Heart sounds: No murmur heard. Pulmonary:     Effort: Pulmonary effort is normal. No respiratory distress.     Breath sounds: Normal breath sounds.  Abdominal:     Palpations: Abdomen is soft.     Tenderness: There is no abdominal tenderness.  Musculoskeletal:        General: No swelling.     Cervical back: Neck supple.     Right lower leg: No edema.     Left lower leg: No edema.     Comments: Generalized swelling and erythema noted on patient's left upper extremity extending from patient's left elbow distally.  Punctate lesion noted on the volar aspect of patient's left wrist.  No pain with passive extension/flexion of wrist.  Radial pulses full and intact bilaterally.  Patient has full active range of motion of bilateral elbows, wrist, digits.  Patient able to make okay sign and oppose thumb resist horizontal adduction of fingers, fist, thumbs up and extend wrist.  No sensory deficits along major nerve distributions of upper extremities.  Muscle strength 5 out of 5.  Skin:    General: Skin is warm and dry.     Capillary Refill: Capillary refill takes less than 2 seconds.  Neurological:     Mental Status: She is alert.  Psychiatric:        Mood and Affect: Mood normal.     ED Results / Procedures / Treatments   Labs (all labs ordered are listed, but only abnormal results are displayed) Labs Reviewed  CBC WITH DIFFERENTIAL/PLATELET - Abnormal; Notable for the following components:      Result Value   WBC 12.7 (*)    Neutro Abs 8.5 (*)    All other components within normal limits  COMPREHENSIVE METABOLIC PANEL - Abnormal; Notable for the following components:   Potassium 3.3 (*)     Glucose, Bld 190 (*)    Calcium 8.8 (*)    All other components within normal limits    EKG None  Radiology No results found.  Procedures Procedures    Medications Ordered in ED Medications  diphenhydrAMINE (BENADRYL) capsule 25 mg (25 mg Oral Given 01/11/22 1715)  famotidine (PEPCID) tablet 20 mg (20 mg Oral Given 01/11/22 1715)  ibuprofen (ADVIL) tablet 600 mg (600 mg Oral Given 01/11/22 1715)    ED Course/ Medical Decision Making/ A&P  Medical Decision Making Amount and/or Complexity of Data Reviewed Labs: ordered.  Risk OTC drugs. Prescription drug management.   This patient presents to the ED for concern of bee sting., this involves an extensive number of treatment options, and is a complaint that carries with it a high risk of complications and morbidity.  The differential diagnosis includes anaphylaxis, cellulitis, urticaria, localized reaction, compartment syndrome, necrotizing fasciitis, erysipelas   Co morbidities that complicate the patient evaluation  Chronic cigarette smoker, diabetes mellitus, hyperlipidemia, hypertension, obesity   Additional history obtained:  Additional history obtained from laboratory studies External records from outside source obtained and reviewed including recent A1c from 11/10/2021 indicating 6.8   Lab Tests:  I Ordered, and personally interpreted labs.  The pertinent results include: Potassium 3.3   Imaging Studies ordered:  N/a   Cardiac Monitoring: / EKG:  The patient was maintained on a cardiac monitor.  I personally viewed and interpreted the cardiac monitored which showed an underlying rhythm of: Sinus rhythm   Consultations Obtained:  N/a   Problem List / ED Course / Critical interventions / Medication management  Hymenoptera I ordered medication including Motrin for inflammation/swelling, Benadryl and Pepcid for itching  Reevaluation of the patient after these medicines showed  that the patient improved I have reviewed the patients home medicines and have made adjustments as needed   Social Determinants of Health:  Denies illicit drug use   Test / Admission - Considered:  Hymenoptera envenomation with localized skin reaction Vitals signs within normal range and stable throughout visit. Laboratory/imaging studies significant for: As indicated above Patient's symptoms was likely secondary to localized reaction secondary to Hymenoptera envenomation.  Doubt cellulitis, doubt compartment syndrome, doubt erysipelas, doubt necrotizing fasciitis.  Symptomatic therapy encouraged with NSAIDs, ice, rest, elevation, Benadryl/famotidine as needed for pruritus.  Do not think prednisone is appropriate at this time given patient's diabetic status as well as significant response to therapies given the emergency department.  Potassium supplementation provided outpatient. Worrisome signs and symptoms were discussed with the patient, and the patient acknowledged understanding to return to the ED if noticed. Patient was stable upon discharge.         Final Clinical Impression(s) / ED Diagnoses Final diagnoses:  Insect bite of left forearm, initial encounter    Rx / DC Orders ED Discharge Orders          Ordered    ibuprofen (ADVIL) 600 MG tablet  Every 6 hours PRN        01/11/22 1813    potassium chloride SA (KLOR-CON M) 20 MEQ tablet   Once        01/11/22 1813              Wilnette Kales, [place] 01/11/22 1817    Hayden Rasmussen, MD 01/11/22 1943

## 2022-01-11 NOTE — Discharge Instructions (Addendum)
.  Note that your work-up today was overall consistent with localized reaction secondary to your bee sting.  You are not currently experiencing signs of anaphylactic type reaction.  I do not think that this looks infectious or cellulitic.  This is to be treated at home with ice, ibuprofen, Benadryl as needed for itching.  Note that ibuprofen increases your risk of GI bleed along with your sertraline take medication with caution.  These medicines/therapies should help decrease swelling and resolve symptoms.  If you notice the symptoms depicted in the information packet attached regarding anaphylaxis, please return to the emergency department.  Of prescribed ibuprofen to take daily for the next 5 to 7 days.  Continue to ice the area for 20 to 25 minutes at a time to help reduce swelling.  Consider elevating affected extremity above your heart.  You can take Benadryl as needed for itching.  Given the fact that you are diabetic, I do not think that steroids are appropriate at this time.  If your swelling gets worse or the pain increases or you notice feelings of throat closing and/difficulty breathing, return to emergency department.  Otherwise, follow-up with your PCP for reevaluation.  Your potassium was also low while in emergency department so I recommend potassium repletion through oral supplement prescription for the next 3 days.  Please not hesitate to return to the emergency department for worrisome signs and symptoms we discussed, parent.

## 2022-01-11 NOTE — ED Provider Triage Note (Signed)
.  Emergency Medicine Provider Triage Evaluation Note  Jenna Wong , a 70 y.o. female  was evaluated in triage.  Pt complains of bee sting.  Patient states that the sting occurred yesterday afternoon.  She used tweezers to remove the stinger stung on the anterior aspect of left arm.  She did notice increasing swelling and redness in affected area.  She states that the area has been itching since she has been stung.  She has not tried any medication for this.  Denies feelings of throat closing in on her, difficulty swallowing, difficulty breathing, fever  Review of Systems  Positive:  Negative: See above  Physical Exam  BP 129/77 (BP Location: Right Arm)   Pulse 88   Temp 98.8 F (37.1 C) (Oral)   Resp 18   SpO2 99%  Gen:   Awake, no distress   Resp:  Normal effort  MSK:   Moves extremities without difficulty  Other:  Swelling and erythema noted on left upper extremity from the elbow distally.  Small puncture wound noted on the anterior aspect of patient's left wrist.  No swelling noted in the submandibular region.  No edema beneath the tongue.  Patient maintaining oral secretions and not in respiratory distress  Medical Decision Making  Medically screening exam initiated at 12:49 PM.  Appropriate orders placed.  Rei Adrean Findlay was informed that the remainder of the evaluation will be completed by another provider, this initial triage assessment does not replace that evaluation, and the importance of remaining in the ED until their evaluation is complete.     Wilnette Kales, [place] 01/11/22 1251

## 2022-01-11 NOTE — ED Triage Notes (Signed)
Patient here from reporting left arm pain and itching after bee sting while being in the garden on yesterday.

## 2022-01-31 ENCOUNTER — Other Ambulatory Visit: Payer: Self-pay | Admitting: Family Medicine

## 2022-01-31 DIAGNOSIS — E118 Type 2 diabetes mellitus with unspecified complications: Secondary | ICD-10-CM

## 2022-02-08 ENCOUNTER — Other Ambulatory Visit: Payer: Self-pay | Admitting: Family Medicine

## 2022-02-09 ENCOUNTER — Other Ambulatory Visit: Payer: Self-pay | Admitting: Family Medicine

## 2022-02-23 ENCOUNTER — Other Ambulatory Visit: Payer: Self-pay | Admitting: Family Medicine

## 2022-02-23 DIAGNOSIS — E118 Type 2 diabetes mellitus with unspecified complications: Secondary | ICD-10-CM

## 2022-02-28 ENCOUNTER — Other Ambulatory Visit: Payer: Self-pay

## 2022-02-28 DIAGNOSIS — E118 Type 2 diabetes mellitus with unspecified complications: Secondary | ICD-10-CM

## 2022-02-28 MED ORDER — METFORMIN HCL 1000 MG PO TABS: 1000.0000 mg | ORAL_TABLET | Freq: Two times a day (BID) | ORAL | 0 refills | Status: DC

## 2022-03-02 ENCOUNTER — Other Ambulatory Visit: Payer: Self-pay | Admitting: *Deleted

## 2022-03-02 DIAGNOSIS — R42 Dizziness and giddiness: Secondary | ICD-10-CM

## 2022-03-09 ENCOUNTER — Other Ambulatory Visit: Payer: Self-pay

## 2022-03-09 DIAGNOSIS — R42 Dizziness and giddiness: Secondary | ICD-10-CM

## 2022-03-12 ENCOUNTER — Other Ambulatory Visit: Payer: Self-pay | Admitting: *Deleted

## 2022-03-12 DIAGNOSIS — R42 Dizziness and giddiness: Secondary | ICD-10-CM

## 2022-03-23 ENCOUNTER — Other Ambulatory Visit: Payer: Self-pay | Admitting: Family Medicine

## 2022-03-23 DIAGNOSIS — E1149 Type 2 diabetes mellitus with other diabetic neurological complication: Secondary | ICD-10-CM

## 2022-03-23 DIAGNOSIS — M79605 Pain in left leg: Secondary | ICD-10-CM

## 2022-03-29 ENCOUNTER — Telehealth: Payer: Self-pay

## 2022-03-29 ENCOUNTER — Ambulatory Visit (INDEPENDENT_AMBULATORY_CARE_PROVIDER_SITE_OTHER): Payer: Medicare HMO | Admitting: Family Medicine

## 2022-03-29 ENCOUNTER — Encounter: Payer: Self-pay | Admitting: Family Medicine

## 2022-03-29 VITALS — BP 110/74 | HR 94 | Ht 67.0 in | Wt 200.4 lb

## 2022-03-29 DIAGNOSIS — F418 Other specified anxiety disorders: Secondary | ICD-10-CM | POA: Diagnosis not present

## 2022-03-29 DIAGNOSIS — Z87891 Personal history of nicotine dependence: Secondary | ICD-10-CM

## 2022-03-29 DIAGNOSIS — E119 Type 2 diabetes mellitus without complications: Secondary | ICD-10-CM | POA: Diagnosis not present

## 2022-03-29 DIAGNOSIS — Z0000 Encounter for general adult medical examination without abnormal findings: Secondary | ICD-10-CM

## 2022-03-29 DIAGNOSIS — I10 Essential (primary) hypertension: Secondary | ICD-10-CM

## 2022-03-29 DIAGNOSIS — Z720 Tobacco use: Secondary | ICD-10-CM | POA: Diagnosis not present

## 2022-03-29 DIAGNOSIS — E118 Type 2 diabetes mellitus with unspecified complications: Secondary | ICD-10-CM

## 2022-03-29 LAB — POCT GLYCOSYLATED HEMOGLOBIN (HGB A1C): HbA1c, POC (controlled diabetic range): 6.4 % (ref 0.0–7.0)

## 2022-03-29 MED ORDER — VARENICLINE TARTRATE (STARTER) 0.5 MG X 11 & 1 MG X 42 PO TBPK: ORAL_TABLET | ORAL | 0 refills | Status: DC

## 2022-03-29 MED ORDER — SERTRALINE HCL 100 MG PO TABS: ORAL_TABLET | ORAL | 0 refills | Status: DC

## 2022-03-29 MED ORDER — METFORMIN HCL 1000 MG PO TABS: 1000.0000 mg | ORAL_TABLET | Freq: Every day | ORAL | 0 refills | Status: DC

## 2022-03-29 NOTE — Assessment & Plan Note (Signed)
Patient had low-normal blood pressure today. With normal blood pressure in the past and only one visit with abnormal blood pressure as well as inc falls and low blood pressure once starting amlodipine. High BP in clinic in past could have been anxiety as patient endorses anxiety.  - Discontinue amlodipine.

## 2022-03-29 NOTE — Assessment & Plan Note (Addendum)
Well controlled. A1c 6.4. Currently on Metformin 6244 mg BID and Trulicity.  - Decrease Metformin to 1000 daily from BID.  - Continue Trulicity  - Recheck in 3 months

## 2022-03-29 NOTE — Assessment & Plan Note (Signed)
Pt smokes at least one pack a day increases when stressed. Has tried Patch and gum previously.  - Prescribed varencicline  - Encouraged therapy to address mood component  - Encouraged lozenges.  - F/u in 4 weeks (will consider pharmacy appt for cessation at that point).  - Pharmacy appt for PFTs

## 2022-03-29 NOTE — Assessment & Plan Note (Signed)
PHQ 9 17. However pt mainly complains of anxiety component. Worsened by environmental factors currently; however, pt greatly desires increase in Zoloft. Unlikely increase will resolve environmental factors.  - Increase Zoloft to 100 mg in AM and 50 mg in PM  - Encouraged therapy and provided resources  - F/u in 4 weeks to discuss zoloft and starting therapy

## 2022-03-29 NOTE — Progress Notes (Addendum)
    SUBJECTIVE:   CHIEF COMPLAINT / HPI:   Blood Pressure Stopped taking the amlodipine 2 weeks ago. Tried to call for an appointment and today was the soonest appointment. Patient started having more falls since she started taking the medicine 3-4 months ago. She reports she had increased lower extremity swelling when she was taking the medicine. She says her blood pressure has never been very high. It was the one clinic visit that her blood pressure was high. She has been taking her blood pressure at home and her blood pressure has been normal.    Diabetes She has continued to take her metformin. She denies polyuria and polydipsia. She continued to take Trulicity.   Mood Mood is getting worse. She feels very fidgity. She feels like her nephew has worsesned her anxiety. He asked to move in with her. He has extended how long he has been living with her.   Smoking Cessation  She says that she smokes when she is stressed and that it helps with her stress. She has tried the gum and the patch. But would find herself smoking with the patch on.   PERTINENT  PMH / PSH: T2DM, HTN   OBJECTIVE:   BP 110/74   Pulse 94   Ht '5\' 7"'$  (1.702 m)   Wt 90.9 kg   SpO2 99%   BMI 31.39 kg/m   Physical Exam  General: well appearing, sitting in chair  HEENT: raspy, deep voice CV: RRR, well perfused  Resp: Diminished breath sounds at bases, prolonged expiratory phase  Abd: Soft, non tender, non distended  Ext: No lower extremity edema    ASSESSMENT/PLAN:   Essential hypertension Patient had low-normal blood pressure today. With normal blood pressure in the past and only one visit with abnormal blood pressure as well as inc falls and low blood pressure once starting amlodipine. High BP in clinic in past could have been anxiety as patient endorses anxiety.  - Discontinue amlodipine.   Diabetes ([place]) Well controlled. A1c 6.4. Currently on Metformin 3419 mg BID and Trulicity.  - Decrease Metformin to  1000 daily from BID.  - Continue Trulicity  - Recheck in 3 months   Depression with anxiety PHQ 9 17. However pt mainly complains of anxiety component. Worsened by environmental factors currently; however, pt greatly desires increase in Zoloft. Unlikely increase will resolve environmental factors.  - Increase Zoloft to 100 mg in AM and 50 mg in PM  - Encouraged therapy and provided resources  - F/u in 4 weeks to discuss zoloft and starting therapy   Personal history of tobacco use Pt smokes at least one pack a day increases when stressed. Has tried Patch and gum previously.  - Prescribed varencicline  - Encouraged therapy to address mood component  - Encouraged lozenges.  - F/u in 4 weeks (will consider pharmacy appt for cessation at that point).  - Pharmacy appt for PFTs   Healthcare maintenance Unable to address at this appt.  - Plan to address weight loss and cancer screening at next appt  - counsel regarding colorectal screening and low dose ct given smoking hx     Lowry Ram, MD [place]

## 2022-03-29 NOTE — Patient Instructions (Addendum)
It was great to see you today! Thank you for choosing [facility] for your primary care. Jenna Wong was seen for blood pressure medication change.  Today we addressed: Blood pressure - Please stop taking your amlodipine  Diabetes - I will change your metformin to only once a day.  Mood - I have increased your Zoloft to 100 in the morning and 50 at night. Lets meet up again to discuss this and see if this actually makes a change.  For information on therapists, please go to [email or web address]. You can also contact your insurance company to find an Theatre manager. Smoking cessation - I prescribed a medicine called varencicline follow the instructions on how to start it. I am also going to send you an appointment to test your breathing.   If you haven't already, sign up for My Chart to have easy access to your labs results, and communication with your primary care physician.  We are checking some labs today. If they are abnormal, I will call you. If they are normal, I will send you a MyChart message (if it is active) or a letter in the mail. If you do not hear about your labs in the next 2 weeks, please call the office.   You should return to our clinic Return in about 4 weeks (around 04/26/2022).  I recommend that you always bring your medications to each appointment as this makes it easy to ensure you are on the correct medications and helps [place] not miss refills when you need them.  Please arrive 15 minutes before your appointment to ensure smooth check in process.  We appreciate your efforts in making this happen.  Please call the clinic at [phone] if your symptoms worsen or you have any concerns.  Thank you for allowing me to participate in your care, Lowry Ram, MD 03/29/2022, 3:27 PM PGY-1, [place]

## 2022-03-29 NOTE — Telephone Encounter (Signed)
[place] calls nurse line requesting clarification on Zoloft prescription.   As it is written now patient is taking '200mg'$  in the morning due to '100mg'$  tablets being called in.   Pharmacy reports it would be "easier" to send in '100mg'$  tablets with her taking (1) tablet in the morning and half a tab in evening.   Will forward back to PCP.

## 2022-03-29 NOTE — Assessment & Plan Note (Signed)
Unable to address at this appt.  - Plan to address weight loss and cancer screening at next appt  - counsel regarding colorectal screening and low dose ct given smoking hx

## 2022-04-02 MED ORDER — SERTRALINE HCL 100 MG PO TABS: ORAL_TABLET | ORAL | 0 refills | Status: DC

## 2022-04-04 ENCOUNTER — Ambulatory Visit (INDEPENDENT_AMBULATORY_CARE_PROVIDER_SITE_OTHER): Payer: Medicare HMO | Admitting: Pharmacist

## 2022-04-04 ENCOUNTER — Encounter: Payer: Self-pay | Admitting: Pharmacist

## 2022-04-04 DIAGNOSIS — Z87891 Personal history of nicotine dependence: Secondary | ICD-10-CM

## 2022-04-04 MED ORDER — VARENICLINE TARTRATE 0.5 MG PO TABS: 0.5000 mg | ORAL_TABLET | Freq: Two times a day (BID) | ORAL | 5 refills | Status: DC

## 2022-04-04 NOTE — Progress Notes (Signed)
Reviewed: I agree with Dr. Koval's documentation and management. 

## 2022-04-04 NOTE — Assessment & Plan Note (Signed)
Patient with longstanding tobacco use evaluated for COPD. Spirometry evaluation with pre- ronchodilator reveals normal lung function.  Lung age: 71.   Tobacco use disorder with moderate nicotine dependence of 40+ years duration in a patient who is good candidate for success because of motivation.   -Initiated varenicline 0.5 mg by mouth once daily with food x7 days, then 0.5 mg by mouth twice daily with food thereafter. Patient counseled on purpose, proper use, and potential adverse effects, including GI upset. Patient expressed concern regarding weight gain while quitting smoking. - Encouraged patient to be mindful of eating habits and quantity of food eaten if appetite increases.  - Encouraged patient to be active

## 2022-04-04 NOTE — Patient Instructions (Addendum)
It was great to see you today.  Good luck in quitting smoking, I am confident that you can reach your goal!  START Chantix (varenicline): take 1 tablet (0.5 mg) by mouth with food daily for 7 days. Then take 1 tablet by mouth with food twice a day thereafter.  We will see you in 3 weeks!

## 2022-04-04 NOTE — Progress Notes (Signed)
   S:   Chief Complaint  Patient presents with   Medication Management    PFT evaluation   Jenna Wong is a 71 y.o. female who presents for evaluation/assistance with tobacco dependence.  PMH is significant for tobacco use disorder, hyperlipidemia, hypertension, diabetes.  Patient was referred and last seen by Primary Care Provider, Dr. Gwendolyn Lima, on 03/29/2022.  At last visit, patient was counseled on tobacco cessation and prescribed varenicline; however, patient did not pick-up medication.   Age when started using tobacco on a daily basis 27-26. Brand smoked [place]. Number of cigarettes/day: almost a pack.  Estimated nicotine content per cigarette (mg) 1.  Estimated nicotine intake per day 20 mg.   Wakes to smoke during the night a few times in a 2 week period.    Most recent quit attempt: at age 10 Longest time ever been tobacco free almost a year, until mother passed.  Medications used in past cessation efforts include: NRT, had nausea from gum  Rates IMPORTANCE of quitting tobacco on 1-10 scale of: 10. Rates CONFIDENCE of quitting tobacco on 1-10 scale of: 10.  Most common triggers to use tobacco include; has a cigarette with morning coffee   Motivation to quit: nephew does not want to bring baby if she is smoking, other nephews and sister would like her to quit as well.  O:  Review of Systems  All other systems reviewed and are negative.   Physical Exam Constitutional:      Appearance: Normal appearance.  Neurological:     Mental Status: She is alert.  Psychiatric:        Mood and Affect: Mood normal.        Behavior: Behavior normal.        Thought Content: Thought content normal.        Judgment: Judgment normal.   mMRC score= <2 See "scanned report" or Documentation Flowsheet (discrete results - PFTs) for Spirometry results. Patient provided good effort while attempting spirometry.    A/P: Patient with longstanding tobacco use evaluated for COPD.  Spirometry evaluation with pre- ronchodilator reveals normal lung function.  Lung age: 32.   Tobacco use disorder with moderate nicotine dependence of 40+ years duration in a patient who is good candidate for success because of motivation.   -Initiated varenicline 0.5 mg by mouth once daily with food x7 days, then 0.5 mg by mouth twice daily with food thereafter. Patient counseled on purpose, proper use, and potential adverse effects, including GI upset. Patient expressed concern regarding weight gain while quitting smoking. - Encouraged patient to be mindful of eating habits and quantity of food eaten if appetite increases.  - Encouraged patient to be active   Written patient instructions provided. Patient verbalized understanding of treatment plan.  Total time in face to face counseling 45 minutes.    Follow-up:  Pharmacist in 3 weeks. PCP clinic visit 3-4 weeks.  Patient seen with Geraldo Docker, PharmD Candidate and Garrel Ridgel PharmD Candidate.

## 2022-04-25 ENCOUNTER — Ambulatory Visit: Payer: Medicare HMO | Admitting: Pharmacist

## 2022-05-03 ENCOUNTER — Ambulatory Visit: Payer: Self-pay | Admitting: Family Medicine

## 2022-05-05 ENCOUNTER — Other Ambulatory Visit: Payer: Self-pay | Admitting: Family Medicine

## 2022-05-23 ENCOUNTER — Other Ambulatory Visit: Payer: Self-pay | Admitting: Family Medicine

## 2022-05-23 DIAGNOSIS — F418 Other specified anxiety disorders: Secondary | ICD-10-CM

## 2022-06-25 HISTORY — PX: BREAST EXCISIONAL BIOPSY: SUR124

## 2022-06-28 ENCOUNTER — Ambulatory Visit (INDEPENDENT_AMBULATORY_CARE_PROVIDER_SITE_OTHER): Payer: Medicare HMO | Admitting: Family Medicine

## 2022-06-28 ENCOUNTER — Encounter: Payer: Self-pay | Admitting: Family Medicine

## 2022-06-28 ENCOUNTER — Other Ambulatory Visit: Payer: Self-pay

## 2022-06-28 VITALS — BP 125/89 | HR 94 | Wt 204.0 lb

## 2022-06-28 DIAGNOSIS — F418 Other specified anxiety disorders: Secondary | ICD-10-CM

## 2022-06-28 DIAGNOSIS — N6452 Nipple discharge: Secondary | ICD-10-CM

## 2022-06-28 DIAGNOSIS — E118 Type 2 diabetes mellitus with unspecified complications: Secondary | ICD-10-CM | POA: Diagnosis not present

## 2022-06-28 DIAGNOSIS — E119 Type 2 diabetes mellitus without complications: Secondary | ICD-10-CM | POA: Diagnosis not present

## 2022-06-28 DIAGNOSIS — N63 Unspecified lump in unspecified breast: Secondary | ICD-10-CM | POA: Insufficient documentation

## 2022-06-28 LAB — POCT GLYCOSYLATED HEMOGLOBIN (HGB A1C): HbA1c, POC (controlled diabetic range): 7 % (ref 0.0–7.0)

## 2022-06-28 MED ORDER — SERTRALINE HCL 100 MG PO TABS: ORAL_TABLET | ORAL | 0 refills | Status: DC

## 2022-06-28 MED ORDER — METFORMIN HCL 1000 MG PO TABS: 1000.0000 mg | ORAL_TABLET | Freq: Two times a day (BID) | ORAL | 0 refills | Status: DC

## 2022-06-28 NOTE — Assessment & Plan Note (Signed)
A1c worsened from 6.4 to 7 with decrease in metformin. Increase metformin back to 1000 BID.  - F/u in 4 months

## 2022-06-28 NOTE — Progress Notes (Signed)
    SUBJECTIVE:   CHIEF COMPLAINT / HPI:   Nipple discharge  Raised bumps on areola and scant white discharge from breast starting a week ago. It hurts when she wears a regular bra. It does not hurt with a soft bra. Denies fevers or chills. Has been taking zofran daily sometimes twice a day for nausea associated with her vertigo for about 2-3 months. Denies any bloody discharge.    Diabetes  No issues with trulicity, States she does not check her sugars at home.   PERTINENT  PMH / PSH: Diabetes, Depression, Vertigo   OBJECTIVE:   BP 125/89   Pulse 94   Wt 204 lb (92.5 kg)   SpO2 99%   BMI 31.95 kg/m   General: well appearing, in no acute distress CV: RRR, radial pulses equal and palpable, no BLE edema  Resp: Normal work of breathing on room air, CTAB Abd: Soft, non tender, non distended  Neuro: Alert & Oriented x 4  Breast: L breast with few papillary nodules at 4'oclock on areola. Scant white discharge with expression. No overlying erythema or warmth, no hard or fixed nodules. R breast w/o any findings.    ASSESSMENT/PLAN:   Type 2 diabetes mellitus without complication, without long-term current use of insulin (HCC) A1c worsened from 6.4 to 7 with decrease in metformin. Increase metformin back to 1000 BID.  - F/u in 4 months -     POCT glycosylated hemoglobin (Hb A1C) -     metFORMIN HCl; Take 1 tablet (1,000 mg total) by mouth 2 (two) times daily with a meal.  Dispense: 90 tablet; Refill: 0  Depression with anxiety -     Sertraline HCl; TAKE 1 TABLET BY MOUTH IN THE MORNING AND 1/2 (ONE-HALF) IN THE EVENING  Dispense: 45 tablet; Refill: 0  Nipple discharge Assessment & Plan: Nipple discharge could be side effect from zofran causing galactorrhea, intraductal papilloma, or malignancy. Patient had un concerning mammogram last year. Galactorrhea from zofran and increased prolactin less likely as discharge is only coming from one breast. Does not appear infectious at this  time.   Orders: -     CBC with Differential/Platelet -     Prolactin -     Basic metabolic panel -     TSH Rfx on Abnormal to Free T4 -     MM Digital Diagnostic Bilat; Future -     [place] Unlisted Procedure Breast; Future      Lowry Ram, MD [place]

## 2022-06-28 NOTE — Patient Instructions (Signed)
It was great to see you today! Thank you for choosing [facility] for your primary care. Jenna Wong was seen for diabetes follow up .  Today we addressed: Diabetes - I am increasing your metformin to twice daily  Nipple discharge - I ordered a bunch of labs, but also ordered a breast ultrasound and diagnostic mammogram.   If you haven't already, sign up for My Chart to have easy access to your labs results, and communication with your primary care physician.  We are checking some labs today. If they are abnormal, I will call you. If they are normal, I will send you a MyChart message (if it is active) or a letter in the mail. If you do not hear about your labs in the next 2 weeks, please call the office.   You should return to our clinic in two months   I recommend that you always bring your medications to each appointment as this makes it easy to ensure you are on the correct medications and helps [place] not miss refills when you need them.  Please arrive 15 minutes before your appointment to ensure smooth check in process.  We appreciate your efforts in making this happen.  Please call the clinic at [phone] if your symptoms worsen or you have any concerns.  Thank you for allowing me to participate in your care, Lowry Ram, MD 06/28/2022, 4:15 PM PGY-1, [place]

## 2022-06-28 NOTE — Assessment & Plan Note (Signed)
Nipple discharge could be side effect from zofran causing galactorrhea, intraductal papilloma, or malignancy. Patient had un concerning mammogram last year. Galactorrhea from zofran and increased prolactin less likely as discharge is only coming from one breast. Does not appear infectious at this time.

## 2022-06-29 LAB — CBC WITH DIFFERENTIAL/PLATELET
Basophils Absolute: 0.1 10*3/uL (ref 0.0–0.2)
Basos: 1 %
EOS (ABSOLUTE): 0.1 10*3/uL (ref 0.0–0.4)
Eos: 1 %
Hematocrit: 44.1 % (ref 34.0–46.6)
Hemoglobin: 14.6 g/dL (ref 11.1–15.9)
Immature Grans (Abs): 0 10*3/uL (ref 0.0–0.1)
Immature Granulocytes: 0 %
Lymphocytes Absolute: 4.5 10*3/uL — ABNORMAL HIGH (ref 0.7–3.1)
Lymphs: 45 %
MCH: 31.2 pg (ref 26.6–33.0)
MCHC: 33.1 g/dL (ref 31.5–35.7)
MCV: 94 fL (ref 79–97)
Monocytes Absolute: 0.6 10*3/uL (ref 0.1–0.9)
Monocytes: 6 %
Neutrophils Absolute: 4.8 10*3/uL (ref 1.4–7.0)
Neutrophils: 47 %
Platelets: 271 10*3/uL (ref 150–450)
RBC: 4.68 x10E6/uL (ref 3.77–5.28)
RDW: 13.1 % (ref 11.7–15.4)
WBC: 10 10*3/uL (ref 3.4–10.8)

## 2022-06-29 LAB — BASIC METABOLIC PANEL
BUN/Creatinine Ratio: 16 (ref 12–28)
BUN: 13 mg/dL (ref 8–27)
CO2: 23 mmol/L (ref 20–29)
Calcium: 9.8 mg/dL (ref 8.7–10.3)
Chloride: 101 mmol/L (ref 96–106)
Creatinine, Ser: 0.8 mg/dL (ref 0.57–1.00)
Glucose: 124 mg/dL — ABNORMAL HIGH (ref 70–99)
Potassium: 4.1 mmol/L (ref 3.5–5.2)
Sodium: 140 mmol/L (ref 134–144)
eGFR: 80 mL/min/{1.73_m2} (ref >59)

## 2022-06-29 LAB — PROLACTIN: Prolactin: 6.5 ng/mL (ref 3.6–25.2)

## 2022-06-29 LAB — TSH RFX ON ABNORMAL TO FREE T4: TSH: 2.18 u[IU]/mL (ref 0.450–4.500)

## 2022-07-02 ENCOUNTER — Encounter: Payer: Self-pay | Admitting: Family Medicine

## 2022-08-01 ENCOUNTER — Other Ambulatory Visit: Payer: Self-pay | Admitting: Family Medicine

## 2022-08-13 ENCOUNTER — Other Ambulatory Visit: Payer: Self-pay | Admitting: Family Medicine

## 2022-08-13 DIAGNOSIS — F418 Other specified anxiety disorders: Secondary | ICD-10-CM

## 2022-08-14 ENCOUNTER — Other Ambulatory Visit: Payer: Self-pay | Admitting: Family Medicine

## 2022-08-14 DIAGNOSIS — E118 Type 2 diabetes mellitus with unspecified complications: Secondary | ICD-10-CM

## 2022-08-16 ENCOUNTER — Other Ambulatory Visit: Payer: Self-pay | Admitting: Family Medicine

## 2022-08-16 ENCOUNTER — Other Ambulatory Visit: Payer: Self-pay | Admitting: *Deleted

## 2022-08-16 DIAGNOSIS — N6452 Nipple discharge: Secondary | ICD-10-CM

## 2022-08-16 DIAGNOSIS — E118 Type 2 diabetes mellitus with unspecified complications: Secondary | ICD-10-CM

## 2022-08-16 DIAGNOSIS — R42 Dizziness and giddiness: Secondary | ICD-10-CM

## 2022-08-16 DIAGNOSIS — E119 Type 2 diabetes mellitus without complications: Secondary | ICD-10-CM

## 2022-08-16 DIAGNOSIS — F418 Other specified anxiety disorders: Secondary | ICD-10-CM

## 2022-08-16 MED ORDER — MECLIZINE HCL 25 MG PO TABS: ORAL_TABLET | ORAL | 0 refills | Status: DC

## 2022-08-17 ENCOUNTER — Ambulatory Visit (INDEPENDENT_AMBULATORY_CARE_PROVIDER_SITE_OTHER): Payer: Medicare HMO | Admitting: Family Medicine

## 2022-08-17 ENCOUNTER — Encounter: Payer: Self-pay | Admitting: Family Medicine

## 2022-08-17 VITALS — BP 122/74 | HR 107 | Ht 67.0 in | Wt 207.4 lb

## 2022-08-17 DIAGNOSIS — F418 Other specified anxiety disorders: Secondary | ICD-10-CM

## 2022-08-17 DIAGNOSIS — R42 Dizziness and giddiness: Secondary | ICD-10-CM | POA: Diagnosis not present

## 2022-08-17 MED ORDER — SERTRALINE HCL 100 MG PO TABS: ORAL_TABLET | ORAL | 0 refills | Status: DC

## 2022-08-17 NOTE — Assessment & Plan Note (Addendum)
-  orthostatics positive, symptoms likely secondary to orthostatic hypotension. BP appropriate, low concern for overtreatment of hypertension. Instructed patient to maintain adequate hydration. Also considered electrolyte abnormalities and anemia but had CBC and BMP last month when symptoms were occurring and both are unremarkable.also considered peripheral neuropathy as contributing etiology, instructed patient to use cane when up and about for fall prevention.  -medications also contributory, advised to decrease gabapentin from twice daily to once daily -referral placed to vestibular rehab  -advised to follow up in 2-3 weeks if dizziness persists or worsens

## 2022-08-17 NOTE — Progress Notes (Signed)
    SUBJECTIVE:   CHIEF COMPLAINT / HPI:   Patient presents with worsening dizziness about 2 months ago. History of vertigo that was diagnosed years ago, has been on meclizine for 2 years which usually helps her symptoms. Sensation that the room is spinning but other times feels like she herself is spinning. She was at the dentist's office on Wednesday when she felt dizzy when they moved her in the chair. Sometimes has blurry vision. Relieving factors include sitting and cold objects. Aggravating factors include laying down and walking. Denies head trauma or injury. Denies loss of consciousness.   OBJECTIVE:   BP 122/74   Pulse (!) 107   Ht 5' 7"$  (1.702 m)   Wt 207 lb 6.4 oz (94.1 kg)   SpO2 100%   BMI 32.48 kg/m   General: Patient well-appearing, in no acute distress. HEENT: PERRLA, non-tender thyroid CV: RRR, no murmurs or gallops auscultated Resp: CTAB, no wheezing, rales or rhonchi noted Neuro: CN 2-12 gross intact, 5/5 UE and LE strength bilaterally, gross sensation intact, negative Romberg testing, normal gait without assistance of cane   ASSESSMENT/PLAN:   Dizziness -orthostatics positive, symptoms likely secondary to orthostatic hypotension. BP appropriate, low concern for overtreatment of hypertension. Instructed patient to maintain adequate hydration. Also considered electrolyte abnormalities and anemia but had CBC and BMP last month when symptoms were occurring and both are unremarkable.also considered peripheral neuropathy as contributing etiology, instructed patient to use cane when up and about for fall prevention.  -medications also contributory, advised to decrease gabapentin from twice daily to once daily -referral placed to vestibular rehab  -advised to follow up in 2-3 weeks if dizziness persists or worsens      Jenna Binz Larae Grooms, DO [place]

## 2022-08-17 NOTE — Patient Instructions (Signed)
It was great seeing you today!  Today we discussed your dizziness, I believe this is due to your vertigo but it may have been getting worse due to a condition called orthostatic hypotension. As we discussed, this can occur when our blood pressure fluctuates as we change positions. Please change positions slowly and drink at least 6-8 glasses of water a day.  Make sure to keep a glass of water on your nightstand each night and drink this first thing in the morning before your feet touch the floor.   Let's decrease gabapentin to once daily.   Use your cane when walking.  I have also placed a referral to vestibular rehab, they should be in contact with you soon to schedule.   If your symptoms are not improving then please follow up in 2 weeks.   Please follow up at your next scheduled appointment, if anything arises between now and then, please don't hesitate to contact our office.   Thank you for allowing [place] to be a part of your medical care!  Thank you, Dr. Larae Grooms  Also a reminder of our clinic's no-show policy. Please make sure to arrive at least 15 minutes prior to your scheduled appointment time. Please try to cancel before 24 hours if you are not able to make it. If you no-show for 2 appointments then you will be receiving a warning letter. If you no-show after 3 visits, then you may be at risk of being dismissed from our clinic. This is to ensure that everyone is able to be seen in a timely manner. Thank you, we appreciate your assistance with this!

## 2022-08-28 ENCOUNTER — Other Ambulatory Visit: Payer: Medicare HMO

## 2022-08-31 ENCOUNTER — Other Ambulatory Visit: Payer: Self-pay | Admitting: Family Medicine

## 2022-08-31 ENCOUNTER — Ambulatory Visit
Admission: RE | Admit: 2022-08-31 | Discharge: 2022-08-31 | Disposition: A | Discharge status: Home / Self Care | Payer: Medicare HMO | Source: Ambulatory Visit | Attending: Family Medicine | Admitting: Family Medicine

## 2022-08-31 DIAGNOSIS — N6452 Nipple discharge: Secondary | ICD-10-CM

## 2022-08-31 DIAGNOSIS — F418 Other specified anxiety disorders: Secondary | ICD-10-CM

## 2022-08-31 DIAGNOSIS — N6320 Unspecified lump in the left breast, unspecified quadrant: Secondary | ICD-10-CM

## 2022-08-31 DIAGNOSIS — E118 Type 2 diabetes mellitus with unspecified complications: Secondary | ICD-10-CM

## 2022-08-31 DIAGNOSIS — E119 Type 2 diabetes mellitus without complications: Secondary | ICD-10-CM

## 2022-09-10 ENCOUNTER — Other Ambulatory Visit: Payer: Self-pay | Admitting: Family Medicine

## 2022-09-11 ENCOUNTER — Ambulatory Visit
Admission: RE | Admit: 2022-09-11 | Discharge: 2022-09-11 | Disposition: A | Discharge status: Home / Self Care | Payer: Medicare HMO | Source: Ambulatory Visit | Attending: Family Medicine | Admitting: Family Medicine

## 2022-09-11 DIAGNOSIS — N6452 Nipple discharge: Secondary | ICD-10-CM

## 2022-09-11 DIAGNOSIS — N6320 Unspecified lump in the left breast, unspecified quadrant: Secondary | ICD-10-CM

## 2022-09-11 HISTORY — PX: BREAST BIOPSY: SHX20

## 2022-09-12 ENCOUNTER — Other Ambulatory Visit: Payer: Self-pay | Admitting: Diagnostic Radiology

## 2022-09-12 DIAGNOSIS — N6452 Nipple discharge: Secondary | ICD-10-CM

## 2022-09-18 ENCOUNTER — Telehealth: Payer: Self-pay

## 2022-09-18 NOTE — Telephone Encounter (Signed)
Submitted re-enrollment application for TRULICITY 0.75MG /0.5ML to [place] for patient assistance.   Phone: [phone]

## 2022-09-20 NOTE — Telephone Encounter (Signed)
Received notification from [place] regarding approval for TRULICITY 0.75MG /0.5ML. Patient assistance approved from 09/18/22 to 06/25/23.  Medication will ship to patients home  Phone: [phone]

## 2022-09-22 ENCOUNTER — Other Ambulatory Visit: Payer: Self-pay | Admitting: Family Medicine

## 2022-09-22 DIAGNOSIS — E119 Type 2 diabetes mellitus without complications: Secondary | ICD-10-CM

## 2022-10-02 ENCOUNTER — Encounter: Payer: Self-pay | Admitting: Diagnostic Radiology

## 2022-10-09 ENCOUNTER — Ambulatory Visit
Admission: RE | Admit: 2022-10-09 | Discharge: 2022-10-09 | Disposition: A | Discharge status: Home / Self Care | Payer: Medicare HMO | Source: Ambulatory Visit | Attending: Diagnostic Radiology | Admitting: Diagnostic Radiology

## 2022-10-09 DIAGNOSIS — N6452 Nipple discharge: Secondary | ICD-10-CM

## 2022-10-09 MED ORDER — GADOPICLENOL 0.5 MMOL/ML IV SOLN
10.0000 mL | Freq: Once | INTRAVENOUS | Status: AC | PRN
  Administered 2022-10-09: 10 mL via INTRAVENOUS

## 2022-10-12 ENCOUNTER — Ambulatory Visit: Payer: Self-pay | Admitting: Surgery

## 2022-10-12 DIAGNOSIS — N6342 Unspecified lump in left breast, subareolar: Secondary | ICD-10-CM

## 2022-10-15 ENCOUNTER — Other Ambulatory Visit: Payer: Self-pay | Admitting: Surgery

## 2022-10-15 DIAGNOSIS — N6342 Unspecified lump in left breast, subareolar: Secondary | ICD-10-CM

## 2022-11-05 ENCOUNTER — Other Ambulatory Visit: Payer: Self-pay | Admitting: Family Medicine

## 2022-11-05 DIAGNOSIS — E119 Type 2 diabetes mellitus without complications: Secondary | ICD-10-CM

## 2022-11-06 ENCOUNTER — Encounter (HOSPITAL_BASED_OUTPATIENT_CLINIC_OR_DEPARTMENT_OTHER): Payer: Self-pay | Admitting: Surgery

## 2022-11-06 ENCOUNTER — Other Ambulatory Visit: Payer: Self-pay

## 2022-11-06 ENCOUNTER — Other Ambulatory Visit: Payer: Self-pay | Admitting: *Deleted

## 2022-11-06 DIAGNOSIS — E118 Type 2 diabetes mellitus with unspecified complications: Secondary | ICD-10-CM

## 2022-11-06 MED ORDER — ONETOUCH VERIO VI STRP: ORAL_STRIP | 0 refills | Status: DC

## 2022-11-06 MED ORDER — ONETOUCH VERIO FLEX SYSTEM W/DEVICE KIT
PACK | 0 refills | Status: DC
Start: 2022-11-06 — End: 2022-11-28

## 2022-11-07 ENCOUNTER — Encounter (HOSPITAL_BASED_OUTPATIENT_CLINIC_OR_DEPARTMENT_OTHER)
Admission: RE | Admit: 2022-11-07 | Discharge: 2022-11-07 | Disposition: A | Discharge status: Home / Self Care | Payer: Medicare Other | Source: Ambulatory Visit | Attending: Surgery | Admitting: Surgery

## 2022-11-07 DIAGNOSIS — I10 Essential (primary) hypertension: Secondary | ICD-10-CM | POA: Diagnosis not present

## 2022-11-07 DIAGNOSIS — Z01818 Encounter for other preprocedural examination: Secondary | ICD-10-CM | POA: Insufficient documentation

## 2022-11-07 DIAGNOSIS — E119 Type 2 diabetes mellitus without complications: Secondary | ICD-10-CM | POA: Insufficient documentation

## 2022-11-07 LAB — BASIC METABOLIC PANEL
Anion gap: 8 (ref 5–15)
BUN: 6 mg/dL — ABNORMAL LOW (ref 8–23)
CO2: 25 mmol/L (ref 22–32)
Calcium: 9.2 mg/dL (ref 8.9–10.3)
Chloride: 107 mmol/L (ref 98–111)
Creatinine, Ser: 0.81 mg/dL (ref 0.44–1.00)
GFR, Estimated: >60 mL/min (ref >60)
Glucose, Bld: 105 mg/dL — ABNORMAL HIGH (ref 70–99)
Potassium: 4.4 mmol/L (ref 3.5–5.1)
Sodium: 140 mmol/L (ref 135–145)

## 2022-11-07 NOTE — Progress Notes (Signed)
       Patient Instructions  The night before surgery:  No food after midnight. ONLY clear liquids after midnight  The day of surgery (if you do NOT have diabetes):  Drink ONE (1) Pre-Surgery Clear Ensure as directed.   This drink was given to you during your hospital  pre-op appointment visit. The pre-op nurse will instruct you on the time to drink the  Pre-Surgery Ensure depending on your surgery time. Finish the drink at the designated time by the pre-op nurse.  Nothing else to drink after completing the  Pre-Surgery Clear Ensure.  The day of surgery (if you have diabetes): Drink ONE (1) Gatorade 2 (G2) as directed. This drink was given to you during your hospital  pre-op appointment visit.  The pre-op nurse will instruct you on the time to drink the   Gatorade 2 (G2) depending on your surgery time. Color of the Gatorade may vary. Red is not allowed. Nothing else to drink after completing the  Gatorade 2 (G2).         If you have questions, please contact your surgeon's office.Patient was provided with CHG cleanser to use at home before the procedure. Patient verbalized understanding of instructions.Patient was provided with CHG cleanser to use at home before the procedure. Patient verbalized understanding of instructions. 

## 2022-11-12 ENCOUNTER — Other Ambulatory Visit: Payer: Self-pay

## 2022-11-12 DIAGNOSIS — E118 Type 2 diabetes mellitus with unspecified complications: Secondary | ICD-10-CM

## 2022-11-13 ENCOUNTER — Ambulatory Visit (HOSPITAL_BASED_OUTPATIENT_CLINIC_OR_DEPARTMENT_OTHER): Admission: RE | Admit: 2022-11-13 | Payer: Medicare Other | Source: Home / Self Care | Admitting: Surgery

## 2022-11-13 DIAGNOSIS — Z01818 Encounter for other preprocedural examination: Secondary | ICD-10-CM

## 2022-11-13 DIAGNOSIS — E119 Type 2 diabetes mellitus without complications: Secondary | ICD-10-CM

## 2022-11-13 DIAGNOSIS — I10 Essential (primary) hypertension: Secondary | ICD-10-CM

## 2022-11-13 SURGERY — BREAST LUMPECTOMY WITH RADIOACTIVE SEED LOCALIZATION
Anesthesia: General | Laterality: Left

## 2022-11-27 ENCOUNTER — Other Ambulatory Visit: Payer: Self-pay | Admitting: Family Medicine

## 2022-11-28 ENCOUNTER — Ambulatory Visit (INDEPENDENT_AMBULATORY_CARE_PROVIDER_SITE_OTHER): Payer: Medicare Other | Admitting: Family Medicine

## 2022-11-28 ENCOUNTER — Encounter: Payer: Self-pay | Admitting: Family Medicine

## 2022-11-28 VITALS — BP 128/79 | HR 83 | Ht 67.0 in | Wt 201.5 lb

## 2022-11-28 DIAGNOSIS — N6322 Unspecified lump in the left breast, upper inner quadrant: Secondary | ICD-10-CM

## 2022-11-28 DIAGNOSIS — Z7189 Other specified counseling: Secondary | ICD-10-CM

## 2022-11-28 DIAGNOSIS — E118 Type 2 diabetes mellitus with unspecified complications: Secondary | ICD-10-CM | POA: Diagnosis not present

## 2022-11-28 MED ORDER — ONETOUCH DELICA PLUS LANCET33G MISC: 0 refills | Status: DC

## 2022-11-28 MED ORDER — ONETOUCH DELICA LANCING DEV MISC
1.0000 | 0 refills | Status: AC
Start: 2022-11-28 — End: ?

## 2022-11-28 MED ORDER — ONETOUCH VERIO FLEX SYSTEM W/DEVICE KIT
PACK | 0 refills | Status: DC
Start: 2022-11-28 — End: 2023-03-03

## 2022-11-28 NOTE — Progress Notes (Signed)
    SUBJECTIVE:   CHIEF COMPLAINT / HPI:   Breast Mass Patient reports that she has no longer had anymore nipple drainage since she has had her biopsy. Reports she still can feel the breast mass but has not had any pain and it has not changed in size. She was supposed to have a lumpectomy with radiation, but was called the day before to have her surgery cancelled. She was unsure why. Was not told a reason. She had not eaten before the surgery.   Grief  Patient reports that her brother had passed away just a week ago She was very close to him and has been having a hard time after his passing. She has been sad and has been taking her medications, but has been a little scattered and not per her normal routine. Feels safe and has support in her sister.    PERTINENT  PMH / PSH: Diabetes, MDD  OBJECTIVE:   BP 128/79   Pulse 83   Ht 5\' 7"  (1.702 m)   Wt 201 lb 8 oz (91.4 kg)   SpO2 100%   BMI 31.56 kg/m   General: well appearing, in no acute distress CV: RRR, radial pulses equal and palpable, no BLE edema  Resp: Normal work of breathing on room air, CTAB Abd: Soft, non tender, non distended  Neuro: Alert & Oriented x 4  Breast: immobile 2 in mass at 9 o'clock position of right areola.    ASSESSMENT/PLAN:   Mass of upper inner quadrant of left breast Assessment & Plan: Per breast MRI report, surgical intervention is highly recommended given suspicious nature of mass on scans and symptoms. Surgery was cancelled due to lack of insurance coverage and was not communicated with patient.  - Will reach out to surgery to see if peer to peer is possible  - Will call patient's insurance.    Type 2 diabetes mellitus with complication, without long-term current use of insulin (HCC) -     OneTouch Verio Flex System; Please use to check blood sugar up to three times per day. E11.9  Dispense: 1 kit; Refill: 0 -     OneTouch Delica Lancing Dev; 1 applicator by Does not apply route every morning.   Dispense: 1 each; Refill: 0 -     OneTouch Delica Plus Lancet33G; USE TO CHECK GLUCOSE ONCE DAILY IN THE MORNING  Dispense: 100 each; Refill: 0  Grief counseling Assessment & Plan: Patient is in grief due to the recent unexpected passing of her brother that she was very close to. Patient does not have any symptoms of complex grief at this time. She has support system including friends and sister.  - Follow up with how patient is doing at next visit (annual physical)        Lockie Mola, MD [facility]

## 2022-11-28 NOTE — Assessment & Plan Note (Signed)
Per breast MRI report, surgical intervention is highly recommended given suspicious nature of mass on scans and symptoms. Surgery was cancelled due to lack of insurance coverage and was not communicated with patient.  - Will reach out to surgery to see if peer to peer is possible  - Will call patient's insurance.

## 2022-11-28 NOTE — Patient Instructions (Signed)
It was wonderful to see you today.  Please bring ALL of your medications with you to every visit.   Today we talked about:  Breast mass - We are calling to see if we can make another appointment. We will try to schedule the surgery and then let you know.   Please follow up for your annual physical with me.    Thank you for choosing [facility].   Please call [phone] with any questions about today's appointment.  Please be sure to schedule follow up at the front desk before you leave today.   Lockie Mola, MD  Family Medicine

## 2022-11-28 NOTE — Assessment & Plan Note (Signed)
Patient is in grief due to the recent unexpected passing of her brother that she was very close to. Patient does not have any symptoms of complex grief at this time. She has support system including friends and sister.  - Follow up with how patient is doing at next visit (annual physical)

## 2022-12-01 ENCOUNTER — Other Ambulatory Visit: Payer: Self-pay | Admitting: Family Medicine

## 2022-12-01 DIAGNOSIS — F418 Other specified anxiety disorders: Secondary | ICD-10-CM

## 2022-12-05 ENCOUNTER — Telehealth: Payer: Self-pay

## 2022-12-05 NOTE — Telephone Encounter (Signed)
[facility] faxed over informing to send over a new prescription for One touch verio test strip due to instructions not being clear, please re do instructions for rx so [facility] can bill the insurance. Thank you. Penni Bombard CMA

## 2022-12-07 ENCOUNTER — Ambulatory Visit (INDEPENDENT_AMBULATORY_CARE_PROVIDER_SITE_OTHER): Payer: Medicare Other

## 2022-12-07 DIAGNOSIS — Z1211 Encounter for screening for malignant neoplasm of colon: Secondary | ICD-10-CM

## 2022-12-07 DIAGNOSIS — Z0000 Encounter for general adult medical examination without abnormal findings: Secondary | ICD-10-CM

## 2022-12-07 NOTE — Progress Notes (Addendum)
I connected with  Jenna Wong on 12/07/22 by a audio enabled telemedicine application and verified that I am speaking with the correct person using two identifiers.  Patient Location: Home  Provider Location: Home Office  I discussed the limitations of evaluation and management by telemedicine. The patient expressed understanding and agreed to proceed.   Subjective:   Jenna Wong is a 72 y.o. female who presents for Medicare Annual (Subsequent) preventive examination.  Review of Systems    Per HPI unless specifically indicated below.        Objective:    Today's Vitals   12/07/22 1518  PainSc: 0-No pain   There is no height or weight on file to calculate BMI.     12/07/2022    3:31 PM 11/28/2022   10:02 AM 08/17/2022    2:09 PM 06/28/2022    3:21 PM 01/11/2022   12:35 PM 12/06/2021    1:49 PM 11/10/2021    2:36 PM  Advanced Directives  Does Patient Have a Medical Advance Directive? No No No No No No No  Would patient like information on creating a medical advance directive? No - Patient declined No - Patient declined  No - Patient declined  No - Patient declined No - Patient declined    Current Medications (verified) Outpatient Encounter Medications as of 12/07/2022  Medication Sig   albuterol (VENTOLIN HFA) 108 (90 Base) MCG/ACT inhaler INHALE 2 PUFFS BY MOUTH EVERY 6 HOURS AS NEEDED FOR WHEEZING FOR SHORTNESS OF BREATH   aspirin 81 MG tablet Take 81 mg by mouth daily.   atorvastatin (LIPITOR) 40 MG tablet Take 1 tablet by mouth once daily   Blood Glucose Monitoring Suppl (ONETOUCH VERIO FLEX SYSTEM) w/Device KIT Please use to check blood sugar up to three times per day. E11.9   fluticasone (FLONASE) 50 MCG/ACT nasal spray Place 1 spray into both nostrils daily. 1 spray in each nostril every day   gabapentin (NEURONTIN) 100 MG capsule TAKE 1 CAPSULE BY MOUTH THREE TIMES DAILY   glucose blood (ONETOUCH VERIO) test strip Use as instructed   ibuprofen (ADVIL)  600 MG tablet Take 1 tablet (600 mg total) by mouth every 6 (six) hours as needed.   Lancet Devices (ONE TOUCH DELICA LANCING DEV) MISC 1 applicator by Does not apply route every morning.   Lancets (ONETOUCH DELICA PLUS LANCET33G) MISC USE TO CHECK GLUCOSE ONCE DAILY IN THE MORNING   meclizine (ANTIVERT) 25 MG tablet TAKE 1 TABLET BY MOUTH THREE TIMES DAILY AS NEEDED FOR DIZZINESS   metFORMIN (GLUCOPHAGE) 1000 MG tablet TAKE 1 TABLET BY MOUTH TWICE DAILY WITH A MEAL   ondansetron (ZOFRAN) 4 MG tablet Take 1 tablet (4 mg total) by mouth every 8 (eight) hours as needed for nausea.   sertraline (ZOLOFT) 100 MG tablet TAKE 1 TABLET BY MOUTH ONCE DAILY NIGHTLY   tiZANidine (ZANAFLEX) 4 MG capsule Take 1 capsule (4 mg total) by mouth 3 (three) times daily as needed for muscle spasms.   TRULICITY 0.75 MG/0.5ML SOPN INJECT 0.75 MG INTO THE SKIN ONCE A WEEK   vitamin B-12 (CYANOCOBALAMIN) 1000 MCG tablet Take 1,000 mcg by mouth daily.   varenicline (CHANTIX) 0.5 MG tablet Take 1 tablet (0.5 mg total) by mouth 2 (two) times daily. Take 1 tablet (0.5 mg) by mouth with food daily for 7 days. Then take 1 tablet by mouth with food twice a day thereafter. (Patient not taking: Reported on 06/28/2022)   No  facility-administered encounter medications on file as of 12/07/2022.    Allergies (verified) Patient has no known allergies.   History: Past Medical History:  Diagnosis Date   Abnormal mammogram 12/00   repeat normal   Abscess breast 7/80   Anxiety    BACK PAIN, LOW 08/22/2006   Patient has been on long-term muscle relaxer and Vicodin. States she uses 3-4 times per week. On the bad days. Also does his home physical therapy. Has had rehabilitation for this chronic back pain. We'll continue with current care plan as prescribed by previous PCP.  05/2008 note from Luretha Murphy: Seen by neurosurgery in Sept 09, MRI of LS spine with lumbar stenosis, neck disc herniation but no c   Bilateral hand numbness  04/21/2018   Breast abscess of female 07/18/2011   Cellulitis 10/03/2020   Cervical disc displacement    diagnosed by MRI--sees dr Mikal Plane, evaluation by disability determination services for permanent disability 04/25/00   Constipation 05/28/2020   Chronic, per patient    Depressed mood 04/10/2021   Depression    Diabetes mellitus without complication (HCC)    Dizziness    Dysphagia 10/23/2019   Gait disturbance 05/28/2020   INSOMNIA, PERSISTENT 01/13/2007   Qualifier: Diagnosis of  By: Humberto Seals NP, Darl Pikes     Irritable bowel syndrome (IBS) 10/10/2011   Mental and behavioral problems with learning    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   Spinal stenosis    MRI LS spine 9/09   SPINAL STENOSIS, LUMBAR 06/22/2008   SPINAL STENOSIS, LUMBAR 06/22/2008   Qualifier: Diagnosis of  By: Humberto Seals NP, Susan     Trapezius muscle spasm 02/18/2020   Past Surgical History:  Procedure Laterality Date   ABDOMINAL HYSTERECTOMY     BREAST BIOPSY Left 09/11/2022   [place] LT BREAST BX W LOC DEV 1ST LESION IMG BX SPEC US GUIDE 09/11/2022 [facility] MAMMO[facility]AP[facility]   BREAST CYST ASPIRATION Left    MYOMECTOMY     THYROID LOBECTOMY N/A 12/14/2019   Procedure: LEFT THYROID LOBECTOMY;  Surgeon: Darnell Level, MD;  Location: [facility];  Service: General;  Laterality: N/A;   Family History  Problem Relation Age of Onset   Parkinsonism Brother    Dementia Brother    Hypertension Mother    Hypertension Father    Depression Sister    Diabetes Sister    Stroke Sister    Breast cancer Sister    Social History   Socioeconomic History   Marital status: Single    Spouse name: Not on file   Number of children: 0   Years of education: 12   Highest education level: Not on file  Occupational History   Occupation: Forensic psychologist: OTHER    Comment: short term disability for LBP 11/2008 for lumbar stenosis and unable to perform physically demanding job   Occupation: Retired  Tobacco Use   Smoking status: Every Day    Packs/day: 1.00     Years: 30.00    Additional pack years: 0.00    Total pack years: 30.00    Types: Cigarettes   Smokeless tobacco: Never   Tobacco comments:    thinking about it  Vaping Use   Vaping Use: Never used  Substance and Sexual Activity   Alcohol use: Yes    Comment: 1 x per month   Drug use: No   Sexual activity: Not Currently    Birth control/protection: Other-see comments  Other Topics Concern   Not on file  Social History Narrative   Patient lives with her sister in [place].    Patient has a dog named honey.    Patient walks honey ~5 days per week for exercise.    Patient is active in her church.    Patient enjoys gardening, watching ID tv, reading her bible, and weekly bible studies.             Social Determinants of Health   Financial Resource Strain: Low Risk  (12/07/2022)   Overall Financial Resource Strain (CARDIA)    Difficulty of Paying Living Expenses: Not hard at all  Food Insecurity: No Food Insecurity (12/07/2022)   Hunger Vital Sign    Worried About Running Out of Food in the Last Year: Never true    Ran Out of Food in the Last Year: Never true  Transportation Needs: No Transportation Needs (12/07/2022)   PRAPARE - Administrator, Civil Service (Medical): No    Lack of Transportation (Non-Medical): No  Physical Activity: Insufficiently Active (12/07/2022)   Exercise Vital Sign    Days of Exercise per Week: 7 days    Minutes of Exercise per Session: 20 min  Stress: No Stress Concern Present (12/07/2022)   [employer] of Occupational Health - Occupational Stress Questionnaire    Feeling of Stress : Not at all  Social Connections: Moderately Integrated (12/07/2022)   Social Connection and Isolation Panel [NHANES]    Frequency of Communication with Friends and Family: More than three times a week    Frequency of Social Gatherings with Friends and Family: More than three times a week    Attends Religious Services: More than 4 times per year     Active Member of [employer] or Organizations: Yes    Attends Banker Meetings: Never    Marital Status: Never married    Tobacco Counseling Ready to quit: Not Answered Counseling given: No Tobacco comments: thinking about it   Clinical Intake:  Pre-visit preparation completed: No  Pain : No/denies pain Pain Score: 0-No pain     Nutritional Status: BMI 25 -29 Overweight Nutritional Risks: None Diabetes: Yes CBG done?: Yes CBG resulted in Enter/ Edit results?: No Did pt. bring in CBG monitor from home?: No  How often do you need to have someone help you when you read instructions, pamphlets, or other written materials from your doctor or pharmacy?: 1 - Never  Diabetic?Nutrition Risk Assessment:  Has the patient had any N/V/D within the last 2 months?  No  Does the patient have any non-healing wounds?  No  Has the patient had any unintentional weight loss or weight gain?  No   Diabetes:  Is the patient diabetic?  Yes  If diabetic, was a CBG obtained today?  Yes  Did the patient bring in their glucometer from home?  No  How often do you monitor your CBG's?once daily .   Financial Strains and Diabetes Management:  Are you having any financial strains with the device, your supplies or your medication? No .  Does the patient want to be seen by Chronic Care Management for management of their diabetes?  No  Would the patient like to be referred to a Nutritionist or for Diabetic Management?  No   Diabetic Exams:  Diabetic Eye Exam: Overdue for diabetic eye exam. Pt has been advised about the importance in completing this exam. Patient advised to call and schedule an eye exam. Diabetic Foot Exam: Overdue, Pt has been advised about the  importance in completing this exam. Pt is scheduled for diabetic foot exam on at next diabetic appt.   Interpreter Needed?: No  Information entered by :: Laurel Dimmer, CMA   Activities of Daily Living    12/07/2022    3:15 PM   In your present state of health, do you have any difficulty performing the following activities:  Hearing? 0  Vision? 1  Comment [facility]  Difficulty concentrating or making decisions? 1  Walking or climbing stairs? 0  Dressing or bathing? 0  Doing errands, shopping? 0    Patient Care Team: Lockie Mola, MD as PCP - General (Family Medicine) Charna Elizabeth, MD (Gastroenterology)  Indicate any recent Medical Services you may have received from other than [facility] providers in the past year (date may be approximate).     Assessment:   This is a routine wellness examination for Riely.   Hearing/Vision screen Denies any hearing issues. Denies any change to her vision. Wear glasses. Annual Eye Exam.   Dietary issues and exercise activities discussed: Current Exercise Habits: Structured exercise class, Type of exercise: walking, Time (Minutes): 30, Frequency (Times/Week): 5, Weekly Exercise (Minutes/Week): 150, Intensity: Moderate, Exercise limited by: None identified   Goals Addressed   None    Depression Screen    12/07/2022    4:01 PM 11/28/2022   10:03 AM 08/17/2022    2:07 PM 06/28/2022    3:20 PM 03/29/2022    2:43 PM 12/06/2021    1:48 PM 11/10/2021    2:36 PM  PHQ 2/9 Scores  PHQ - 2 Score 0 3 4 6 5 4 6   PHQ- 9 Score 3 13 12 19 17 19 21     Fall Risk    12/07/2022    3:14 PM 08/17/2022    2:08 PM 06/28/2022    3:20 PM 12/06/2021    1:48 PM 11/10/2021    2:35 PM  Fall Risk   Falls in the past year? 1 1 0 0 1  Number falls in past yr: 0 1 0 0 1  Injury with Fall? 1 0 0 0 1  Risk for fall due to : Other (Comment) History of fall(s)   Impaired balance/gait  Risk for fall due to: Comment tripped over something      Follow up Falls evaluation completed Falls evaluation completed     Comment  md informed       FALL RISK PREVENTION PERTAINING TO THE HOME:  Any stairs in or around the home? Yes  If so, are there any without handrails? No  Home free of loose throw  rugs in walkways, pet beds, electrical cords, etc? Yes  Adequate lighting in your home to reduce risk of falls? Yes   ASSISTIVE DEVICES UTILIZED TO PREVENT FALLS:  Life alert? No  Use of a cane, walker or w/c? No  Grab bars in the bathroom? No  Shower chair or bench in shower? Yes Elevated toilet seat or a handicapped toilet? No   TIMED UP AND GO:  Was the test performed? Unable to perform, virtual appointment   Cognitive Function:    04/14/2012    2:00 PM  MMSE - Mini Mental State Exam  Orientation to time 5  Orientation to Place 5  Registration 3  Attention/ Calculation 1  Recall 2  Language- name 2 objects 2  Language- repeat 1  Language- follow 3 step command 3  Language- read & follow direction 1  Write a sentence 0  Copy  design 1  Total score 24        12/07/2022    3:16 PM 12/08/2020    3:38 PM  6CIT Screen  What Year? 0 points 0 points  What month? 0 points 0 points  What time? 0 points 0 points  Count back from 20 0 points 0 points  Months in reverse 0 points 0 points  Repeat phrase 0 points 0 points  Total Score 0 points 0 points    Immunizations Immunization History  Administered Date(s) Administered   Fluad Quad(high Dose 65+) 06/27/2020, 06/21/2021   Influenza Split 04/10/2011, 03/13/2012   Influenza Whole 07/04/2009, 04/04/2010   Influenza, High Dose Seasonal PF 04/12/2019   Influenza,inj,Quad PF,6+ Mos 03/11/2013, 06/06/2015, 08/03/2016, 05/03/2017, 04/21/2018   Influenza-Unspecified 01/31/2022   PFIZER(Purple Top)SARS-COV-2 Vaccination 08/07/2019, 09/01/2019, 04/28/2020   PNEUMOCOCCAL CONJUGATE-20 01/09/2021   PPD Test 04/10/2011, 04/10/2013   Pfizer Covid-19 Vaccine Bivalent Booster 28yrs & up 06/21/2021   Pneumococcal Polysaccharide-23 04/12/2019   Td 02/24/2003   Zoster Recombinat (Shingrix) 04/12/2019   Zoster, Unspecified 01/31/2022    TDAP status: Due, Education has been provided regarding the importance of this vaccine. Advised  may receive this vaccine at local pharmacy or Health Dept. Aware to provide a copy of the vaccination record if obtained from local pharmacy or Health Dept. Verbalized acceptance and understanding.  Flu Vaccine status: Up to date  Pneumococcal vaccine status: Up to date  Covid-19 vaccine status: Information provided on how to obtain vaccines.   Qualifies for Shingles Vaccine? Yes   Zostavax completed Yes   Shingrix Completed?: Yes  Screening Tests Health Maintenance  Topic Date Due   OPHTHALMOLOGY EXAM  Never done   Lung Cancer Screening  Never done   DTaP/Tdap/Td (2 - Tdap) 02/23/2013   DEXA SCAN  Never done   FOOT EXAM  12/31/2019   Diabetic kidney evaluation - Urine ACR  06/27/2021   Colonoscopy  11/28/2021   COVID-19 Vaccine (5 - 2023-24 season) 12/12/2022 (Originally 02/23/2022)   HEMOGLOBIN A1C  12/27/2022   INFLUENZA VACCINE  01/24/2023   Diabetic kidney evaluation - eGFR measurement  11/07/2023   Medicare Annual Wellness (AWV)  12/07/2023   MAMMOGRAM  10/08/2024   Pneumonia Vaccine 74+ Years old  Completed   Hepatitis C Screening  Completed   Zoster Vaccines- Shingrix  Completed   HPV VACCINES  Aged Out    Health Maintenance  Health Maintenance Due  Topic Date Due   OPHTHALMOLOGY EXAM  Never done   Lung Cancer Screening  Never done   DTaP/Tdap/Td (2 - Tdap) 02/23/2013   DEXA SCAN  Never done   FOOT EXAM  12/31/2019   Diabetic kidney evaluation - Urine ACR  06/27/2021   Colonoscopy  11/28/2021    Colorectal cancer screening: Referral to GI placed 12/07/2022. Pt aware the office will call re: appt. Last colonoscopy was 11/28/2016 recommend repeating in 5 yrs.   Mammogram status: Completed 10/09/2022. Repeat every year  DEXA Scan: overdue   Lung Cancer Screening: (Low Dose CT Chest recommended if Age 64-80 years, 30 pack-year currently smoking OR have quit w/in 15years.) does qualify.   Lung Cancer Screening Referral: declined, will talk to the provider when  she come in for her physical.   Additional Screening:  Hepatitis C Screening: does qualify; Completed 09/18/2019  Vision Screening: Recommended annual ophthalmology exams for early detection of glaucoma and other disorders of the eye. Is the patient up to date with their annual eye exam?  Yes  Who is the provider or what is the name of the office in which the patient attends annual eye exams? [facility]  If pt is not established with a provider, would they like to be referred to a provider to establish care? No .   Dental Screening: Recommended annual dental exams for proper oral hygiene  Community Resource Referral / Chronic Care Management: CRR required this visit?  No   CCM required this visit?  No      Plan:     I have personally reviewed and noted the following in the patient's chart:   Medical and social history Use of alcohol, tobacco or illicit drugs  Current medications and supplements including opioid prescriptions. Patient is not currently taking opioid prescriptions. Functional ability and status Nutritional status Physical activity Advanced directives List of other physicians Hospitalizations, surgeries, and ER visits in previous 12 months Vitals Screenings to include cognitive, depression, and falls Referrals and appointments  In addition, I have reviewed and discussed with patient certain preventive protocols, quality metrics, and best practice recommendations. A written personalized care plan for preventive services as well as general preventive health recommendations were provided to patient.    Ms. Goldfine , Thank you for taking time to come for your Medicare Wellness Visit. I appreciate your ongoing commitment to your health goals. Please review the following plan we discussed and let me know if I can assist you in the future.   These are the goals we discussed:  Goals   None     This is a list of the screening recommended for you and due  dates:  Health Maintenance  Topic Date Due   Eye exam for diabetics  Never done   Screening for Lung Cancer  Never done   DTaP/Tdap/Td vaccine (2 - Tdap) 02/23/2013   DEXA scan (bone density measurement)  Never done   Complete foot exam   12/31/2019   Yearly kidney health urinalysis for diabetes  06/27/2021   Colon Cancer Screening  11/28/2021   COVID-19 Vaccine (5 - 2023-24 season) 12/12/2022*   Hemoglobin A1C  12/27/2022   Flu Shot  01/24/2023   Yearly kidney function blood test for diabetes  11/07/2023   Medicare Annual Wellness Visit  12/07/2023   Mammogram  10/08/2024   Pneumonia Vaccine  Completed   Hepatitis C Screening  Completed   Zoster (Shingles) Vaccine  Completed   HPV Vaccine  Aged Out  *Topic was postponed. The date shown is not the original due date.     Lonna Cobb, CMA   12/07/2022   Nurse Notes: Approximately 30 minute Non-Face -To-Face Medicare Wellness Visit     I have reviewed this visit and agree with the documentation.

## 2022-12-07 NOTE — Patient Instructions (Signed)

## 2022-12-10 ENCOUNTER — Other Ambulatory Visit: Payer: Self-pay

## 2022-12-10 ENCOUNTER — Encounter: Payer: Self-pay | Admitting: Family Medicine

## 2022-12-10 ENCOUNTER — Ambulatory Visit (INDEPENDENT_AMBULATORY_CARE_PROVIDER_SITE_OTHER): Payer: Medicare Other | Admitting: Family Medicine

## 2022-12-10 VITALS — BP 133/89 | HR 99 | Ht 67.0 in | Wt 197.4 lb

## 2022-12-10 DIAGNOSIS — E118 Type 2 diabetes mellitus with unspecified complications: Secondary | ICD-10-CM | POA: Diagnosis not present

## 2022-12-10 DIAGNOSIS — Z720 Tobacco use: Secondary | ICD-10-CM | POA: Diagnosis not present

## 2022-12-10 DIAGNOSIS — Z13820 Encounter for screening for osteoporosis: Secondary | ICD-10-CM | POA: Diagnosis not present

## 2022-12-10 LAB — POCT GLYCOSYLATED HEMOGLOBIN (HGB A1C): HbA1c, POC (controlled diabetic range): 6.6 % (ref 0.0–7.0)

## 2022-12-10 NOTE — Patient Instructions (Addendum)
Please call GI office if they don't call you, please call them to schedule your colonoscopy   [facility], [place] [address] [place], [place] [address] [phone]  You can call this number to schedule your bone scan. It is already ordered in the system. [phone].     For your lung cancer screening you can go to the hospital and go to the radiology department and have your CT done.   Go to this website for grief support counseling  [email or web address] 848-114-3467

## 2022-12-10 NOTE — Progress Notes (Signed)
    SUBJECTIVE:   CHIEF COMPLAINT / HPI:   Annual Physical  Tobacco history - has been smoking 40 years ppd currently has never had lung cancer screening. Has tried chantix and it has not worked for her.  Obesity - has been walking and not eating sweets   FRAX score 10, hip fracture 2.3.   PERTINENT  PMH / PSH: T2DM, HTN, Depression, HLD  OBJECTIVE:   BP 133/89   Pulse 99   Ht 5\' 7"  (1.702 m)   Wt 197 lb 6.4 oz (89.5 kg)   SpO2 100%   BMI 30.92 kg/m   General: well appearing, in no acute distress CV: RRR, radial pulses equal and palpable, no BLE edema  Resp: Normal work of breathing on room air, CTAB Abd: Soft, non tender, non distended  Neuro: Alert & Oriented x 4  Foot:   ASSESSMENT/PLAN:   Type 2 diabetes mellitus with complication, without long-term current use of insulin (HCC) -     POCT glycosylated hemoglobin (Hb A1C) -     Basic metabolic panel -     Microalbumin / creatinine urine ratio -     Ambulatory referral to Ophthalmology  Tobacco use -     CT CHEST LUNG CANCER SCREENING LOW DOSE WO CONTRAST; Future  Screening for osteoporosis -     HM DEXA SCAN      Lockie Mola, MD [facility]

## 2022-12-11 ENCOUNTER — Encounter: Payer: Self-pay | Admitting: Pharmacist

## 2022-12-11 ENCOUNTER — Ambulatory Visit (INDEPENDENT_AMBULATORY_CARE_PROVIDER_SITE_OTHER): Payer: Medicare Other | Admitting: Pharmacist

## 2022-12-11 VITALS — BP 134/78 | HR 95 | Wt 199.7 lb

## 2022-12-11 DIAGNOSIS — E119 Type 2 diabetes mellitus without complications: Secondary | ICD-10-CM | POA: Diagnosis not present

## 2022-12-11 DIAGNOSIS — Z720 Tobacco use: Secondary | ICD-10-CM

## 2022-12-11 LAB — MICROALBUMIN / CREATININE URINE RATIO
Creatinine, Urine: 312.4 mg/dL
Microalb/Creat Ratio: 14 mg/g creat (ref 0–29)
Microalbumin, Urine: 43.6 ug/mL

## 2022-12-11 LAB — BASIC METABOLIC PANEL
BUN/Creatinine Ratio: 14 (ref 12–28)
BUN: 13 mg/dL (ref 8–27)
CO2: 23 mmol/L (ref 20–29)
Calcium: 9.9 mg/dL (ref 8.7–10.3)
Chloride: 101 mmol/L (ref 96–106)
Creatinine, Ser: 0.92 mg/dL (ref 0.57–1.00)
Glucose: 117 mg/dL — ABNORMAL HIGH (ref 70–99)
Potassium: 4.6 mmol/L (ref 3.5–5.2)
Sodium: 139 mmol/L (ref 134–144)
eGFR: 67 mL/min/{1.73_m2} (ref >59)

## 2022-12-11 MED ORDER — TRULICITY 1.5 MG/0.5ML ~~LOC~~ SOAJ
1.5000 mg | SUBCUTANEOUS | 3 refills | Status: DC
Start: 2022-12-11 — End: 2023-07-05

## 2022-12-11 NOTE — Assessment & Plan Note (Signed)
Tobacco cessation - currently smoking 1/2 ppd. She has trialed varenicline and NRT therapy without success.  - Agreed to cut down try to cut down cigarette intake prior to next visit -Goal < 10 at next follow-up.  Discussed goal of 8 or less per day.

## 2022-12-11 NOTE — Progress Notes (Signed)
S:     Chief Complaint  Patient presents with   Medication Management    Diabetes   72 y.o. female who presents for diabetes evaluation, education, and management.  PMH is significant for diabetes, HLD, and tobacco use disorder.   Patient was referred and last seen by Primary Care Provider, Dr. Marsh Dolly, on 12/10/22. A1c was improved at that time to 6.6%, previously 7.0%.   Today, patient arrives in good spirits and presents without any assistance. Her main desire for today's visit is to discuss losing weight.  Patient reports Diabetes was diagnosed in 2018.   Current diabetes medications include: Trulicity 0.75 mg weekly (inject Wednesdays), metformin 1000 mg BID Current hyperlipidemia medications include: atorvastatin 40 mg daily  Patient reports adherence to taking all medications as prescribed.   Family history: diabetes and hypertension   Insurance coverage: [employer] Medicare  Patient denies hypoglycemic events.  Reported home blood sugars: highest ~ 160s  Patient reported dietary habits:  - Cut out pork from her diet - Has cut back on sweets, but every once and a while "will get a sweet tooth"  Patient-reported exercise habits: walks her dog ~30 minutes twice daily and will often ride her bike  Age when started using tobacco on a daily basis [age]. Brand smoked [place]. Number of cigarettes/day half a pack.  Estimated nicotine content per cigarette (mg) 1.  Estimated nicotine intake per day 10mg .    Medications used in past cessation efforts include: NRT (had nausea from gum) and varenicline (did not work)   Most common triggers to use tobacco include: work and stressful life events (mother had a stroke)   O:  Review of Systems  All other systems reviewed and are negative.   Physical Exam Vitals reviewed.  Pulmonary:     Effort: Pulmonary effort is normal.  Neurological:     Mental Status: She is alert.  Psychiatric:        Mood and Affect: Mood normal.         Behavior: Behavior normal.        Thought Content: Thought content normal.      Lab Results  Component Value Date   HGBA1C 6.6 12/10/2022   There were no vitals filed for this visit.  Lipid Panel     Component Value Date/Time   CHOL 136 01/09/2021 1426   TRIG 83 01/09/2021 1426   HDL 40 01/09/2021 1426   CHOLHDL 3.4 01/09/2021 1426   CHOLHDL 5.8 (H) 06/10/2015 0843   VLDL 24 06/10/2015 0843   LDLCALC 80 01/09/2021 1426   LDLDIRECT 106 (H) 01/30/2012 1057    Clinical Atherosclerotic Cardiovascular Disease (ASCVD): No  The 10-year ASCVD risk score (Arnett DK, et al., 2019) is: 33.6%   Values used to calculate the score:     Age: 52 years     Sex: Female     Is Non-Hispanic African American: Yes     Diabetic: Yes     Tobacco smoker: Yes     Systolic Blood Pressure: 133 mmHg     Is BP treated: No     HDL Cholesterol: 40 mg/dL     Total Cholesterol: 136 mg/dL    A/P: Diabetes longstanding currently controlled based on current A1c. Patient is able to verbalize appropriate hypoglycemia management plan. Medication adherence appears appropriate.  Continue titration of GLP to assist with weight loss goal in addition to A1C control.   -Increased GLP-1 Trulicity (dulaglutide) to 1.5 mg weekly -Continued metformin  1000 mg BID.  -Extensively discussed pathophysiology of diabetes, recommended lifestyle interventions, dietary effects on blood sugar control.  -Counseled on s/sx of and management of hypoglycemia.  -Next A1c anticipated 02/2023.   ASCVD risk - primary prevention in patient with diabetes. Last LDL is 80 not at goal of <70 mg/dL. high intensity statin indicated.  -Continued atorvastatin 40 mg.   Tobacco cessation - currently smoking 1/2 ppd. She has trialed varenicline and NRT therapy without success.  - Agreed to cut down try to cut down cigarette intake prior to next visit -Goal < 10 at next follow-up.  Discussed goal of 8 or less per day.   Written patient  instructions provided. Patient verbalized understanding of treatment plan.  Total time in face to face counseling 29 minutes.    Follow-up:  Pharmacist in 3-4 months. PCP clinic visit in 12/09/23.  Patient seen with Lily Peer, PharmD, PGY-1 resident.

## 2022-12-11 NOTE — Assessment & Plan Note (Signed)
Encouraged usage of nicotine gum. Patient back in precontemplative stage.

## 2022-12-11 NOTE — Patient Instructions (Addendum)
It was nice to see you today!  Your goal blood sugar is 80-130 before eating and less than 180 after eating.  Medication Changes: Increase Trulicity to 1.5 mg weekly   Continue metformin 1000 mg twice daily  Monitor blood sugars at home and keep a log (glucometer or piece of paper) to bring with you to your next visit.  Keep up the good work with diet and exercise. Aim for a diet full of vegetables, fruit and lean meats (chicken, [place], fish). Try to limit salt intake by eating fresh or frozen vegetables (instead of canned), rinse canned vegetables prior to cooking and do not add any additional salt to meals.   Please reschedule to visit with [place]

## 2022-12-11 NOTE — Assessment & Plan Note (Signed)
A1c 6.6. Well controlled.  - Appointment with Dr. Raymondo Band on 6/18 for CGM.

## 2022-12-11 NOTE — Assessment & Plan Note (Signed)
Diabetes longstanding currently controlled based on current A1c. Patient is able to verbalize appropriate hypoglycemia management plan. Medication adherence appears appropriate.  Continue titration of GLP to assist with weight loss goal in addition to A1C control.   -Increased GLP-1 Trulicity (dulaglutide) to 1.5 mg weekly -Continued metformin 1000 mg BID.  -Extensively discussed pathophysiology of diabetes, recommended lifestyle interventions, dietary effects on blood sugar control.  -Counseled on s/sx of and management of hypoglycemia.  -Next A1c anticipated 02/2023.

## 2022-12-12 NOTE — Progress Notes (Signed)
Reviewed and agree with Dr Koval's plan.   

## 2022-12-13 NOTE — Telephone Encounter (Signed)
Called patient and informed of lab results of elevated glucose, as expected and proteinuria. However, patient is already on medication to address these concerns. Will consider jardiance at upcoming appointment.

## 2022-12-23 ENCOUNTER — Other Ambulatory Visit: Payer: Self-pay | Admitting: Family Medicine

## 2022-12-23 DIAGNOSIS — E119 Type 2 diabetes mellitus without complications: Secondary | ICD-10-CM

## 2023-01-02 ENCOUNTER — Ambulatory Visit (HOSPITAL_COMMUNITY)
Admission: RE | Admit: 2023-01-02 | Discharge: 2023-01-02 | Disposition: A | Discharge status: Home / Self Care | Payer: Medicare Other | Source: Ambulatory Visit | Attending: Family Medicine | Admitting: Family Medicine

## 2023-01-02 DIAGNOSIS — J432 Centrilobular emphysema: Secondary | ICD-10-CM | POA: Diagnosis not present

## 2023-01-02 DIAGNOSIS — Z720 Tobacco use: Secondary | ICD-10-CM

## 2023-01-02 DIAGNOSIS — Z122 Encounter for screening for malignant neoplasm of respiratory organs: Secondary | ICD-10-CM | POA: Insufficient documentation

## 2023-01-02 DIAGNOSIS — F17210 Nicotine dependence, cigarettes, uncomplicated: Secondary | ICD-10-CM | POA: Diagnosis not present

## 2023-01-06 ENCOUNTER — Emergency Department (HOSPITAL_COMMUNITY): Payer: Medicare Other

## 2023-01-06 ENCOUNTER — Emergency Department (HOSPITAL_BASED_OUTPATIENT_CLINIC_OR_DEPARTMENT_OTHER): Payer: Medicare Other

## 2023-01-06 ENCOUNTER — Other Ambulatory Visit: Payer: Self-pay

## 2023-01-06 ENCOUNTER — Emergency Department (HOSPITAL_COMMUNITY)
Admission: EM | Admit: 2023-01-06 | Discharge: 2023-01-06 | Disposition: A | Discharge status: Home / Self Care | Payer: Medicare Other | Attending: Student | Admitting: Student

## 2023-01-06 DIAGNOSIS — Z7984 Long term (current) use of oral hypoglycemic drugs: Secondary | ICD-10-CM | POA: Insufficient documentation

## 2023-01-06 DIAGNOSIS — R2241 Localized swelling, mass and lump, right lower limb: Secondary | ICD-10-CM | POA: Insufficient documentation

## 2023-01-06 DIAGNOSIS — E119 Type 2 diabetes mellitus without complications: Secondary | ICD-10-CM | POA: Insufficient documentation

## 2023-01-06 DIAGNOSIS — R609 Edema, unspecified: Secondary | ICD-10-CM

## 2023-01-06 DIAGNOSIS — M1711 Unilateral primary osteoarthritis, right knee: Secondary | ICD-10-CM

## 2023-01-06 DIAGNOSIS — Z7982 Long term (current) use of aspirin: Secondary | ICD-10-CM | POA: Insufficient documentation

## 2023-01-06 LAB — COMPREHENSIVE METABOLIC PANEL
ALT: 27 U/L (ref 0–44)
AST: 28 U/L (ref 15–41)
Albumin: 4.2 g/dL (ref 3.5–5.0)
Alkaline Phosphatase: 59 U/L (ref 38–126)
Anion gap: 8 (ref 5–15)
BUN: 17 mg/dL (ref 8–23)
CO2: 22 mmol/L (ref 22–32)
Calcium: 9.3 mg/dL (ref 8.9–10.3)
Chloride: 107 mmol/L (ref 98–111)
Creatinine, Ser: 0.8 mg/dL (ref 0.44–1.00)
GFR, Estimated: >60 mL/min (ref >60)
Glucose, Bld: 100 mg/dL — ABNORMAL HIGH (ref 70–99)
Potassium: 3.9 mmol/L (ref 3.5–5.1)
Sodium: 137 mmol/L (ref 135–145)
Total Bilirubin: 0.9 mg/dL (ref 0.3–1.2)
Total Protein: 7.8 g/dL (ref 6.5–8.1)

## 2023-01-06 LAB — CBC WITH DIFFERENTIAL/PLATELET
Abs Immature Granulocytes: 0.03 10*3/uL (ref 0.00–0.07)
Basophils Absolute: 0 10*3/uL (ref 0.0–0.1)
Basophils Relative: 0 %
Eosinophils Absolute: 0.2 10*3/uL (ref 0.0–0.5)
Eosinophils Relative: 2 %
HCT: 42.7 % (ref 36.0–46.0)
Hemoglobin: 14 g/dL (ref 12.0–15.0)
Immature Granulocytes: 0 %
Lymphocytes Relative: 43 %
Lymphs Abs: 4.2 10*3/uL — ABNORMAL HIGH (ref 0.7–4.0)
MCH: 30.8 pg (ref 26.0–34.0)
MCHC: 32.8 g/dL (ref 30.0–36.0)
MCV: 94.1 fL (ref 80.0–100.0)
Monocytes Absolute: 0.6 10*3/uL (ref 0.1–1.0)
Monocytes Relative: 6 %
Neutro Abs: 4.8 10*3/uL (ref 1.7–7.7)
Neutrophils Relative %: 49 %
Platelets: 259 10*3/uL (ref 150–400)
RBC: 4.54 MIL/uL (ref 3.87–5.11)
RDW: 14.5 % (ref 11.5–15.5)
WBC: 9.9 10*3/uL (ref 4.0–10.5)
nRBC: 0 % (ref 0.0–0.2)

## 2023-01-06 LAB — CBG MONITORING, ED
Glucose-Capillary: 111 mg/dL — ABNORMAL HIGH (ref 70–99)
Glucose-Capillary: 119 mg/dL — ABNORMAL HIGH (ref 70–99)

## 2023-01-06 MED ORDER — HYDROCODONE-ACETAMINOPHEN 5-325 MG PO TABS
1.0000 | ORAL_TABLET | Freq: Once | ORAL | Status: AC
  Administered 2023-01-06: 1 via ORAL
  Filled 2023-01-06: qty 1

## 2023-01-06 MED ORDER — ONDANSETRON 4 MG PO TBDP
4.0000 mg | ORAL_TABLET | Freq: Once | ORAL | Status: AC
  Administered 2023-01-06: 4 mg via ORAL
  Filled 2023-01-06: qty 1

## 2023-01-06 NOTE — ED Triage Notes (Addendum)
Pt c/o x1 week of worsening rt leg swelling. Now having difficulty walking due to pain. Denies specific injury. Denies hx of CHF.

## 2023-01-06 NOTE — ED Provider Notes (Signed)
[place] EMERGENCY DEPARTMENT AT [facility] Provider Note   CSN: [id number] Arrival date & time: 01/06/23  1306     History  Chief Complaint  Patient presents with   rt leg swelling    Jenna Wong is a 72 y.o. female, history of diabetes, who presents to the ED secondary to right lower extremity swelling has been going on for the past week.  She states that she does not know what happened, but she noticed that around her knee, it has become more swollen.  She denies any recent trauma or surgeries.  Denies any redness, but sister at bedside states that it has been worse warm lately.  The swelling has progressively been getting worse, and now it is very painful to walk.    Home Medications Prior to Admission medications   Medication Sig Start Date End Date Taking? Authorizing Provider  albuterol (VENTOLIN HFA) 108 (90 Base) MCG/ACT inhaler INHALE 2 PUFFS BY MOUTH EVERY 6 HOURS AS NEEDED FOR WHEEZING FOR SHORTNESS OF BREATH 02/09/21   Autry-Lott, Randa Evens, DO  aspirin 81 MG tablet Take 81 mg by mouth daily.    [provider]  atorvastatin (LIPITOR) 40 MG tablet Take 1 tablet by mouth once daily 09/11/22   Lockie Mola, MD  Blood Glucose Monitoring Suppl (ONETOUCH VERIO FLEX SYSTEM) w/Device KIT Please use to check blood sugar up to three times per day. E11.9 11/28/22   Lockie Mola, MD  Dulaglutide (TRULICITY) 1.5 MG/0.5ML SOPN Inject 1.5 mg into the skin once a week. 12/11/22   McDiarmid, Leighton Roach, MD  fluticasone (FLONASE) 50 MCG/ACT nasal spray Place 1 spray into both nostrils daily. 1 spray in each nostril every day 08/10/21   Cathleen Corti, MD  gabapentin (NEURONTIN) 100 MG capsule TAKE 1 CAPSULE BY MOUTH THREE TIMES DAILY 03/26/22   Lockie Mola, MD  glucose blood (ONETOUCH VERIO) test strip Use as instructed 11/06/22   Lockie Mola, MD  ibuprofen (ADVIL) 600 MG tablet Take 1 tablet (600 mg total) by mouth every 6 (six) hours as needed. 01/11/22    Peter Garter, PA  Lancet Devices (ONE TOUCH DELICA LANCING DEV) MISC 1 applicator by Does not apply route every morning. 11/28/22   Lockie Mola, MD  Lancets (ONETOUCH DELICA PLUS LANCET33G) MISC USE TO CHECK GLUCOSE ONCE DAILY IN THE MORNING 11/28/22   Lockie Mola, MD  meclizine (ANTIVERT) 25 MG tablet TAKE 1 TABLET BY MOUTH THREE TIMES DAILY AS NEEDED FOR DIZZINESS 08/16/22   Lockie Mola, MD  metFORMIN (GLUCOPHAGE) 1000 MG tablet TAKE 1 TABLET BY MOUTH TWICE DAILY WITH A MEAL 12/24/22   Lockie Mola, MD  ondansetron (ZOFRAN) 4 MG tablet Take 1 tablet (4 mg total) by mouth every 8 (eight) hours as needed for nausea. 01/09/21   Simmons-Robinson, Makiera, MD  sertraline (ZOLOFT) 100 MG tablet TAKE 1 TABLET BY MOUTH ONCE DAILY NIGHTLY 12/03/22   Lockie Mola, MD  tiZANidine (ZANAFLEX) 4 MG capsule Take 1 capsule (4 mg total) by mouth 3 (three) times daily as needed for muscle spasms. Patient not taking: Reported on 12/11/2022 11/13/21   Al Decant, PA-C  vitamin B-12 (CYANOCOBALAMIN) 1000 MCG tablet Take 1,000 mcg by mouth daily.    [provider]      Allergies    Patient has no known allergies.    Review of Systems   Review of Systems  Constitutional:  Negative for fever.  Musculoskeletal:        +R  knee and leg pain    Physical Exam Updated Vital Signs BP 103/78 (BP Location: Right Arm)   Pulse 83   Temp 98.7 F (37.1 C) (Oral)   Resp 16   SpO2 100%  Physical Exam Vitals and nursing note reviewed.  Constitutional:      General: She is not in acute distress.    Appearance: She is well-developed.  HENT:     Head: Normocephalic and atraumatic.  Eyes:     Conjunctiva/sclera: Conjunctivae normal.  Cardiovascular:     Rate and Rhythm: Normal rate and regular rhythm.     Heart sounds: No murmur heard. Pulmonary:     Effort: Pulmonary effort is normal. No respiratory distress.     Breath sounds: Normal breath sounds.  Abdominal:     Palpations: Abdomen  is soft.     Tenderness: There is no abdominal tenderness.  Musculoskeletal:        General: Swelling present.     Cervical back: Neck supple.     Comments: Tenderness to palpation of the medial and posterior aspect of the right knee, no bony tenderness however, however there is a mild amount of edema to the medial aspect of the knee.  Venous tenderness to palpation as well.  Lennin Osmond healing abrasion noted with mild erythema lateral to the knee.  No overlying erythema or of the knee/leg  Skin:    General: Skin is warm and dry.     Capillary Refill: Capillary refill takes less than 2 seconds.  Neurological:     Mental Status: She is alert.  Psychiatric:        Mood and Affect: Mood normal.     ED Results / Procedures / Treatments   Labs (all labs ordered are listed, but only abnormal results are displayed) Labs Reviewed  CBC WITH DIFFERENTIAL/PLATELET - Abnormal; Notable for the following components:      Result Value   Lymphs Abs 4.2 (*)    All other components within normal limits  COMPREHENSIVE METABOLIC PANEL - Abnormal; Notable for the following components:   Glucose, Bld 100 (*)    All other components within normal limits  CBG MONITORING, ED - Abnormal; Notable for the following components:   Glucose-Capillary 119 (*)    All other components within normal limits  CBG MONITORING, ED - Abnormal; Notable for the following components:   Glucose-Capillary 111 (*)    All other components within normal limits    EKG None  Radiology DG Knee Complete 4 Views Right  Result Date: 01/06/2023 CLINICAL DATA:  Right leg pain and swelling over the past week. EXAM: RIGHT KNEE - COMPLETE 4+ VIEW COMPARISON:  None Available. FINDINGS: No acute fracture or dislocation. No joint effusion. Joab Carden tricompartmental marginal osteophytes. Chondrocalcinosis of the menisci. Soft tissues are unremarkable. IMPRESSION: 1.  No acute osseous abnormality. 2. Mild tricompartmental osteoarthritis.  Electronically Signed   By: Obie Dredge M.D.   On: 01/06/2023 15:02    Procedures Procedures   Medications Ordered in ED Medications  HYDROcodone-acetaminophen (NORCO/VICODIN) 5-325 MG per tablet 1 tablet (1 tablet Oral Given 01/06/23 1355)  ondansetron (ZOFRAN-ODT) disintegrating tablet 4 mg (4 mg Oral Given 01/06/23 1404)    ED Course/ Medical Decision Making/ A&P                             Medical Decision Making Patient is 72 year old female, here for swelling of her knee,/leg it has been  going on for the last week.  States it hurts to walk on it.  Does have a Hisham Provence abrasion on the lateral aspect of her knee, no bony tenderness, just tenderness to the medial and posterior aspect.  We will obtain an ultrasound, and x-ray for further evaluation.  Amount and/or Complexity of Data Reviewed Labs: ordered.    Details: Labs within normal limits Radiology: ordered.    Details: No acute osseous abnormality on x-ray Discussion of management or test interpretation with external provider(s): X-ray unremarkable, labs within normal limits, pending ultrasound of leg, to rule out DVT.  Signed out to [facility], she will appropriately disposition.  Risk Prescription drug management.    Final Clinical Impression(s) / ED Diagnoses Final diagnoses:  None    Rx / DC Orders ED Discharge Orders     None         Orla Jolliff, Harley Alto, PA 01/06/23 1516    Glendora Score, MD 01/07/23 1116

## 2023-01-06 NOTE — ED Provider Notes (Signed)
Physical Exam  BP 103/78 (BP Location: Right Arm)   Pulse 83   Temp 98.7 F (37.1 C) (Oral)   Resp 16   SpO2 100%   Physical Exam Vitals and nursing note reviewed.  Constitutional:      Appearance: Normal appearance.  HENT:     Head: Atraumatic.  Pulmonary:     Effort: Pulmonary effort is normal.  Musculoskeletal:     Comments: Right knee with no obvious deformity Right knee with slight tenderness to palpation along the joint line, medial edge and posterior aspect of the knee. No tenderness to palpation along the lateral joint line Able to flex and extend the knee  Able to walk  Skin:    General: Skin is warm and dry.     Comments: No erythema, mild edema of the medial edge of knees bilaterally   Neurological:     General: No focal deficit present.     Mental Status: She is alert.  Psychiatric:        Mood and Affect: Mood normal.        Behavior: Behavior normal.     Procedures  Procedures  ED Course / MDM   Clinical Course as of 01/06/23 1624  Sun Jan 06, 2023  1606 VAS [place] LOWER EXTREMITY VENOUS (DVT) (ONLY [facility] & [facility]) [AF]    Clinical Course User Index [AF] Arabella Merles, PA-C   Medical Decision Making Amount and/or Complexity of Data Reviewed Labs: ordered. Radiology: ordered.  Risk Prescription drug management.   72 y.o. female with pertinent past medical history of hypertension, diabetes presents to the ED for concern of right knee pain  Differential diagnosis includes but is not limited to soft tissue injury, fracture, Baker's cyst, cellulitis  ED Course:  Patient handed off to me by Barnie Alderman PA-C. Plan to follow [place] and walk test to determine dispo.  Upon re-evaluation, patient reports her pain in the right knee has improved with the medication given today.  She notes swelling of the medial and posterior aspect of her knee.  Upon evaluation this is hard to appreciate, right and left knees look about equal.  Mild tenderness to palpation  along the medial aspect of her knee. Ultrasound shows no signs of clot, x-ray shows tricompartmental arthritis of the right knee.  Suspect arthritis flare versus Baker's cyst given posterior swelling and pain Patient informed that if this was a ruptured Baker's cyst this will reabsorb on its own.  She may use compressive sleeve for comfort.  She may use Tylenol and diclofenac gel apply topically as needed for pain.  Avoided NSAIDs today as she is on sertraline.  Impression: Tricompartmental arthritis of the right knee  Disposition:  The patient was discharged home with instructions for pain control for her right knee.  She was instructed that if her pain and swelling do not start to improve, she may visit the orthopedic office to have further management of her arthritis. Return precautions given.  Lab Tests: I Ordered, and personally interpreted labs.  The pertinent results include:   CBC with no leukocytosis, CMP unremarkable  Imaging Studies ordered: I ordered imaging studies including right knee x-ray I independently visualized the imaging with scope of interpretation limited to determining acute life threatening conditions related to emergency care. X-ray shows mild tricompartmental arthritis right knee Ultrasound with no signs of clot I agree with the radiologist interpretation   Cardiac Monitoring: / EKG: Not indicated   Consultations Obtained: Not indicated   Co  morbidities that complicate the patient evaluation  Hypertension, diabetes  Social Determinants of Health:  Smoker             Arabella Merles, Cordelia Poche 01/06/23 1651    Vanetta Mulders, MD 01/10/23 [phone]

## 2023-01-06 NOTE — Discharge Instructions (Addendum)
Your x-ray showed that you have some arthritis in your right knee. Your ultrasound did not show any clots.  Your right knee pain is likely due to your arthritis.  You may take up to 1000mg  of tylenol every 6 hours as needed for pain.  You can also get Voltaren (Diclofenac) gel over-the-counter and apply this to your knee to help with pain.    Ice and elevate the knee to help with swelling.  You may use a compression sleeve over the knee for comfort.  If your symptoms persist, you may make an appointment at the orthopedic office listed above for further management of your arthritis.  Return to the ER if you have any fever, chills, difficulty walking, calf pain, shortness of breath, any other concerning symptoms.

## 2023-01-24 ENCOUNTER — Telehealth: Payer: Self-pay | Admitting: Family Medicine

## 2023-01-24 ENCOUNTER — Other Ambulatory Visit: Payer: Self-pay | Admitting: Family Medicine

## 2023-01-24 NOTE — Telephone Encounter (Signed)
Discussed RAD1 Chest CT with patient with evidence of COPD.  Patient relayed that surgeon has still not contacted her regarding cancelled lumpectomy (cancelled due to insurance).  I informed patient that I would send a message to the surgeon asking them to discuss with insurance company regarding importance of procedure.

## 2023-02-06 ENCOUNTER — Inpatient Hospital Stay: Payer: Medicare Other | Admitting: Family Medicine

## 2023-02-06 ENCOUNTER — Telehealth: Payer: Self-pay | Admitting: Family Medicine

## 2023-02-06 ENCOUNTER — Ambulatory Visit (INDEPENDENT_AMBULATORY_CARE_PROVIDER_SITE_OTHER): Payer: Medicare Other | Admitting: Family Medicine

## 2023-02-06 ENCOUNTER — Other Ambulatory Visit: Payer: Self-pay | Admitting: Family Medicine

## 2023-02-06 VITALS — BP 107/77 | HR 94 | Ht 67.0 in | Wt 200.4 lb

## 2023-02-06 DIAGNOSIS — N63 Unspecified lump in unspecified breast: Secondary | ICD-10-CM

## 2023-02-06 DIAGNOSIS — M25561 Pain in right knee: Secondary | ICD-10-CM | POA: Diagnosis not present

## 2023-02-06 MED ORDER — NAPROXEN 500 MG PO TABS: 500.0000 mg | ORAL_TABLET | Freq: Two times a day (BID) | ORAL | 1 refills | Status: AC

## 2023-02-06 NOTE — Telephone Encounter (Signed)
I changed it to the 2:10 slot. Aquilla Solian, CMA

## 2023-02-06 NOTE — Progress Notes (Signed)
    SUBJECTIVE:   CHIEF COMPLAINT / HPI:   Knee Pain Started several months ago.  No injury.   Had swelling but this is improved. Seen in ER 7/14.  Normal calf [place].  Xray showed "Small tricompartmental marginal osteophytes. Chondrocalcinosis of the menisci. Soft tissues are unremarkable." Using voltaren gel.  Had been on NSAID long ago not sure why stopped.  No history of ulcers or bleeding or kidney problems Has back pain but no other joint pain No rashes   OBJECTIVE:   BP 107/77   Pulse 94   Ht 5\' 7"  (1.702 m)   Wt 200 lb 6.4 oz (90.9 kg)   SpO2 100%   BMI 31.39 kg/m   Walking at good pace with a cane R Knee mildly decreased range of motion Small effusion No laxity  Primarily tender medially Distal strength sensation intact  Reviewed xrays with her ASSESSMENT/PLAN:   Acute pain of right knee Assessment & Plan: Consistent with DJD.  No signs of fracture or tumor or infection or gout.  Recommend tylenol regularly adding naprosyn as needed    Other orders -     Naproxen; Take 1 tablet (500 mg total) by mouth 2 (two) times daily with a meal.  Dispense: 60 tablet; Refill: 1     Patient Instructions  Good to see you today - Thank you for coming in  Things we discussed today:  Arthritis Take tylenol Xstrength 2 tabs three times a day If not helping enough then Naprosyn 500 mb twice a day as needed Voltaren gel four times a day Check with your GI doctor when to stop the Naprosyn  If not improved in 3-4 weeks or if getting worse or fever or more swelling then come back    Carney Living, MD [facility]

## 2023-02-06 NOTE — Assessment & Plan Note (Signed)
Consistent with DJD.  No signs of fracture or tumor or infection or gout.  Recommend tylenol regularly adding naprosyn as needed

## 2023-02-06 NOTE — Telephone Encounter (Signed)
She agrees to come in at 210 today instead of 430

## 2023-02-06 NOTE — Patient Instructions (Signed)
Good to see you today - Thank you for coming in  Things we discussed today:  Arthritis Take tylenol Xstrength 2 tabs three times a day If not helping enough then Naprosyn 500 mb twice a day as needed Voltaren gel four times a day Check with your GI doctor when to stop the Naprosyn  If not improved in 3-4 weeks or if getting worse or fever or more swelling then come back

## 2023-02-09 ENCOUNTER — Other Ambulatory Visit: Payer: Self-pay | Admitting: Family Medicine

## 2023-02-09 DIAGNOSIS — E119 Type 2 diabetes mellitus without complications: Secondary | ICD-10-CM

## 2023-02-26 ENCOUNTER — Other Ambulatory Visit: Payer: Self-pay | Admitting: Family Medicine

## 2023-02-26 DIAGNOSIS — F418 Other specified anxiety disorders: Secondary | ICD-10-CM

## 2023-03-01 ENCOUNTER — Encounter: Payer: Self-pay | Admitting: Family Medicine

## 2023-03-01 DIAGNOSIS — K648 Other hemorrhoids: Secondary | ICD-10-CM | POA: Insufficient documentation

## 2023-03-01 DIAGNOSIS — K635 Polyp of colon: Secondary | ICD-10-CM | POA: Insufficient documentation

## 2023-03-03 ENCOUNTER — Other Ambulatory Visit: Payer: Self-pay

## 2023-03-03 ENCOUNTER — Encounter (HOSPITAL_COMMUNITY): Payer: Self-pay

## 2023-03-03 ENCOUNTER — Emergency Department (HOSPITAL_COMMUNITY)
Admission: EM | Admit: 2023-03-03 | Discharge: 2023-03-03 | Disposition: A | Discharge status: Home / Self Care | Payer: Medicare Other | Attending: Emergency Medicine | Admitting: Emergency Medicine

## 2023-03-03 DIAGNOSIS — N611 Abscess of the breast and nipple: Secondary | ICD-10-CM | POA: Diagnosis present

## 2023-03-03 DIAGNOSIS — Z7982 Long term (current) use of aspirin: Secondary | ICD-10-CM | POA: Diagnosis not present

## 2023-03-03 LAB — COMPREHENSIVE METABOLIC PANEL
ALT: 24 U/L (ref 0–44)
AST: 25 U/L (ref 15–41)
Albumin: 4 g/dL (ref 3.5–5.0)
Alkaline Phosphatase: 63 U/L (ref 38–126)
Anion gap: 12 (ref 5–15)
BUN: 11 mg/dL (ref 8–23)
CO2: 24 mmol/L (ref 22–32)
Calcium: 9.4 mg/dL (ref 8.9–10.3)
Chloride: 101 mmol/L (ref 98–111)
Creatinine, Ser: 0.83 mg/dL (ref 0.44–1.00)
GFR, Estimated: >60 mL/min (ref >60)
Glucose, Bld: 105 mg/dL — ABNORMAL HIGH (ref 70–99)
Potassium: 4.2 mmol/L (ref 3.5–5.1)
Sodium: 137 mmol/L (ref 135–145)
Total Bilirubin: 0.9 mg/dL (ref 0.3–1.2)
Total Protein: 7.7 g/dL (ref 6.5–8.1)

## 2023-03-03 LAB — CBC WITH DIFFERENTIAL/PLATELET
Abs Immature Granulocytes: 0.04 10*3/uL (ref 0.00–0.07)
Basophils Absolute: 0 10*3/uL (ref 0.0–0.1)
Basophils Relative: 0 %
Eosinophils Absolute: 0.1 10*3/uL (ref 0.0–0.5)
Eosinophils Relative: 1 %
HCT: 42.7 % (ref 36.0–46.0)
Hemoglobin: 14 g/dL (ref 12.0–15.0)
Immature Granulocytes: 0 %
Lymphocytes Relative: 35 %
Lymphs Abs: 3.7 10*3/uL (ref 0.7–4.0)
MCH: 31 pg (ref 26.0–34.0)
MCHC: 32.8 g/dL (ref 30.0–36.0)
MCV: 94.7 fL (ref 80.0–100.0)
Monocytes Absolute: 0.6 10*3/uL (ref 0.1–1.0)
Monocytes Relative: 6 %
Neutro Abs: 6.1 10*3/uL (ref 1.7–7.7)
Neutrophils Relative %: 58 %
Platelets: 284 10*3/uL (ref 150–400)
RBC: 4.51 MIL/uL (ref 3.87–5.11)
RDW: 13.8 % (ref 11.5–15.5)
WBC: 10.5 10*3/uL (ref 4.0–10.5)
nRBC: 0 % (ref 0.0–0.2)

## 2023-03-03 MED ORDER — CEPHALEXIN 500 MG PO CAPS: 500.0000 mg | ORAL_CAPSULE | Freq: Four times a day (QID) | ORAL | 0 refills | Status: AC

## 2023-03-03 NOTE — ED Provider Notes (Signed)
[place] EMERGENCY DEPARTMENT AT [facility] Provider Note   CSN: [id number] Arrival date & time: 03/03/23  1211     History  Chief Complaint  Patient presents with   Abscess    Jenna Wong is a 72 y.o. female.  Patient complains of an abscess on her left breast.  Patient reports she began having increased swelling to the area 4 days ago.  Patient reports yesterday the area opened and drained.  Patient describes yellow purulent drainage.  She states she is feeling some better today but was concerned about infection.  Patient tells me that she was supposed to have surgery due to a breast mass in this area.  Patient reports that the surgery was canceled because her insurance was out of network.  Patient reports she was supposed to be referred to a another surgeon/facility but she has not heard anything from anyone.  Patient denies any fever or chills she denies any shortness of breath no nausea or vomiting  The history is provided by the patient. No language interpreter was used.  Abscess      Home Medications Prior to Admission medications   Medication Sig Start Date End Date Taking? Authorizing Provider  aspirin 81 MG tablet Take 81 mg by mouth daily.   Yes [provider]  cephALEXin (KEFLEX) 500 MG capsule Take 1 capsule (500 mg total) by mouth 4 (four) times daily for 7 days. 03/03/23 03/10/23 Yes Cheron Schaumann K, PA-C  Dulaglutide (TRULICITY) 1.5 MG/0.5ML SOPN Inject 1.5 mg into the skin once a week. 12/11/22  Yes McDiarmid, Leighton Roach, MD  fluticasone (FLONASE) 50 MCG/ACT nasal spray Place 1 spray into both nostrils daily. 1 spray in each nostril every day 08/10/21  Yes Patel, Halina Maidens, MD  gabapentin (NEURONTIN) 100 MG capsule TAKE 1 CAPSULE BY MOUTH THREE TIMES DAILY 03/26/22  Yes Lockie Mola, MD  Lancet Devices (ONE TOUCH DELICA LANCING DEV) MISC 1 applicator by Does not apply route every morning. 11/28/22  Yes Lockie Mola, MD  metFORMIN  (GLUCOPHAGE) 1000 MG tablet TAKE 1 TABLET BY MOUTH TWICE DAILY WITH A MEAL 02/12/23  Yes Lockie Mola, MD  naproxen (NAPROSYN) 500 MG tablet Take 1 tablet (500 mg total) by mouth 2 (two) times daily with a meal. 02/06/23  Yes Chambliss, Estill Batten, MD  sertraline (ZOLOFT) 100 MG tablet TAKE 1 TABLET BY MOUTH ONCE DAILY NIGHTLY Patient taking differently: Take 100 mg by mouth See admin instructions. TAKE 1 TABLET BY MOUTH ONCE DAILY NIGHTLY 02/26/23  Yes Lockie Mola, MD  vitamin B-12 (CYANOCOBALAMIN) 1000 MCG tablet Take 1,000 mcg by mouth daily.   Yes [provider]      Allergies    Patient has no known allergies.    Review of Systems   Review of Systems  Skin:  Positive for wound.  All other systems reviewed and are negative.   Physical Exam Updated Vital Signs BP 116/74 (BP Location: Left Arm)   Pulse 71   Temp 97.8 F (36.6 C) (Oral)   Resp 18   Ht 5\' 7"  (1.702 m)   Wt 90.6 kg   SpO2 94%   BMI 31.28 kg/m  Physical Exam Vitals and nursing note reviewed.  Constitutional:      Appearance: She is well-developed.  HENT:     Head: Normocephalic.  Cardiovascular:     Rate and Rhythm: Normal rate.  Pulmonary:     Effort: Pulmonary effort is normal.     Comments:  3cm swollen area left breast, open area approximately 7 mm, no current drainage. Abdominal:     General: There is no distension.  Musculoskeletal:        General: Normal range of motion.     Cervical back: Normal range of motion.  Skin:    General: Skin is warm.  Neurological:     General: No focal deficit present.     Mental Status: She is alert and oriented to person, place, and time.     ED Results / Procedures / Treatments   Labs (all labs ordered are listed, but only abnormal results are displayed) Labs Reviewed  COMPREHENSIVE METABOLIC PANEL - Abnormal; Notable for the following components:      Result Value   Glucose, Bld 105 (*)    All other components within normal limits  CBC WITH  DIFFERENTIAL/PLATELET    EKG None  Radiology No results found.  Procedures Procedures    Medications Ordered in ED Medications - No data to display  ED Course/ Medical Decision Making/ A&P                                 Medical Decision Making Patient complains of an abscess to her left breast that began draining yesterday.  Patient reports that she was told that she needed to have a lumpectomy due to possible breast cancer.  Patient reports that she has not been scheduled to have this procedure.  Amount and/or Complexity of Data Reviewed External Data Reviewed: notes.    Details: Family practice notes reviewed Labs: ordered. Decision-making details documented in ED Course.    Details: Labs ordered reviewed and interpreted patient has a normal white blood cell count  Risk Prescription drug management. Risk Details: Patient counseled on infection.  Patient is given a prescription for Keflex she is advised warm compresses.  Patient is advised to call her family practice physician on Monday to schedule to be seen and to discuss surgical follow-up.  Patient is advised it would be beneficial if she contacts her insurance company to find out what surgical practice they cover.   I sent a secure chat to family practice regarding patient's need for an appointment and help with navigating her follow-up.         Final Clinical Impression(s) / ED Diagnoses Final diagnoses:  Abscess of left breast    Rx / DC Orders ED Discharge Orders          Ordered    cephALEXin (KEFLEX) 500 MG capsule  4 times daily        03/03/23 1558           An After Visit Summary was printed and given to the patient.    Osie Cheeks 03/03/23 1713    Lorre Nick, MD 03/05/23 1257

## 2023-03-03 NOTE — ED Triage Notes (Signed)
Pt arrived pov. Reports abscess on L breast. States was suppose to have surgery on her breast and had clips placed there. Reports have abscess shortly after. Denies fever, chill, N/V or any other symptoms

## 2023-03-06 ENCOUNTER — Encounter: Payer: Self-pay | Admitting: Family Medicine

## 2023-03-06 ENCOUNTER — Ambulatory Visit (INDEPENDENT_AMBULATORY_CARE_PROVIDER_SITE_OTHER): Payer: Medicare Other | Admitting: Family Medicine

## 2023-03-06 ENCOUNTER — Other Ambulatory Visit: Payer: Self-pay

## 2023-03-06 VITALS — BP 140/80 | HR 89 | Ht 67.0 in | Wt 200.2 lb

## 2023-03-06 DIAGNOSIS — N61 Mastitis without abscess: Secondary | ICD-10-CM

## 2023-03-06 DIAGNOSIS — N6320 Unspecified lump in the left breast, unspecified quadrant: Secondary | ICD-10-CM | POA: Diagnosis not present

## 2023-03-06 DIAGNOSIS — Z23 Encounter for immunization: Secondary | ICD-10-CM | POA: Diagnosis not present

## 2023-03-06 NOTE — Patient Instructions (Addendum)
Good to see you today - Thank you for coming in  Things we discussed today:  Infection Take all the antibiotics four times a day until all gone If stops improving or if gets worse then come back   Breast lump - Call [employer] to see what places they cover - Call the breast center to see what they suggest  Follow up with Dr Marsh Dolly in October

## 2023-03-06 NOTE — Progress Notes (Signed)
    SUBJECTIVE:   CHIEF COMPLAINT / HPI:   Breast Abscess Seen in ER on 9/8  "Patient complains of an abscess to her left breast that began draining yesterday. Patient reports that she was told that she needed to have a lumpectomy due to possible breast cancer. Patient reports that she has not been scheduled to have this procedure. " Has been taking cephalexin twice a day since then.  Feels is getting better - less discharge and pain.  No fever or nausea and vomiting   PERTINENT  PMH / PSH: Thyroid enlarged, Diabetes (controlled), Hypertension, Tobacco, Depression    OBJECTIVE:   BP (!) 140/80   Pulse 89   Ht 5\' 7"  (1.702 m)   Wt 200 lb 3.2 oz (90.8 kg)   SpO2 100%   BMI 31.36 kg/m   L Breast - small area at 3oclock on the areola that appears to have been open.  No discharge now.  Mild induration behind nipple minially tender No axillary adenopathy   ASSESSMENT/PLAN:   Encounter for immunization -     Flu Vaccine Trivalent High Dose (Fluad)  Mass of left breast, unspecified quadrant Assessment & Plan: Encouraged to follow up with Breast Center and insurance for next steps for biopsy.  She and her sister are working on it    Breast Infection Improving.  Discussed taking the cephalexin four times a day and when to return   Patient Instructions  Good to see you today - Thank you for coming in  Things we discussed today:  Infection Take all the antibiotics four times a day until all gone If stops improving or if gets worse then come back   Breast lump - Call [employer] to see what places they cover - Call the breast center to see what they suggest  Follow up with Dr Marsh Dolly in October    Carney Living, MD [facility]

## 2023-03-06 NOTE — Assessment & Plan Note (Signed)
Encouraged to follow up with Breast Center and insurance for next steps for biopsy.  She and her sister are working on it

## 2023-04-22 ENCOUNTER — Other Ambulatory Visit: Payer: Self-pay | Admitting: Family Medicine

## 2023-04-24 ENCOUNTER — Ambulatory Visit: Payer: Medicare Other | Admitting: Family Medicine

## 2023-04-24 ENCOUNTER — Encounter: Payer: Self-pay | Admitting: Family Medicine

## 2023-04-24 VITALS — BP 135/82 | HR 74 | Ht 67.0 in | Wt 202.0 lb

## 2023-04-24 DIAGNOSIS — I10 Essential (primary) hypertension: Secondary | ICD-10-CM | POA: Diagnosis not present

## 2023-04-24 DIAGNOSIS — E119 Type 2 diabetes mellitus without complications: Secondary | ICD-10-CM

## 2023-04-24 DIAGNOSIS — Z7985 Long-term (current) use of injectable non-insulin antidiabetic drugs: Secondary | ICD-10-CM

## 2023-04-24 DIAGNOSIS — Z720 Tobacco use: Secondary | ICD-10-CM

## 2023-04-24 DIAGNOSIS — N6320 Unspecified lump in the left breast, unspecified quadrant: Secondary | ICD-10-CM

## 2023-04-24 LAB — POCT GLYCOSYLATED HEMOGLOBIN (HGB A1C): HbA1c, POC (controlled diabetic range): 6.3 % (ref 0.0–7.0)

## 2023-04-24 NOTE — Assessment & Plan Note (Signed)
I called [facility] where she apparently had been scheduled for surgery according to the breast center.  According to patient the surgery had been cancelled due to insurance issues.   I left VM on [facility] nurse line to call me back on my cell .   Hopefully can figure out appropriate follow up

## 2023-04-24 NOTE — Assessment & Plan Note (Signed)
Well controlled. Continue current meds

## 2023-04-24 NOTE — Assessment & Plan Note (Signed)
Some improvement by report.  Recommend follow up with Dr Raymondo Band

## 2023-04-24 NOTE — Patient Instructions (Signed)
Good to see you today - Thank you for coming in  Things we discussed today:  I will contact [facility] and find out next steps.   If you do not hear from me in 2 days call back   Make an appointment to see Dr Raymondo Band about smoking   Your diabetes is doing well

## 2023-04-24 NOTE — Progress Notes (Unsigned)
    SUBJECTIVE:   CHIEF COMPLAINT / HPI:   Breast Returns with intermittent discharge from L breast at same spot as did in September.  Has not been seen since.  No current fevers or pain at the site or increased redness Patient unsure who to follow up with.  Apparently had surgery scheduled but was cancelled due to insurance coverage  I spoke with [facility] She had breast MRI [facility], had seed marker placed and was referred to Dr Maisie Fus on 4/19 at [facility]  I called [facility] left vm at nurse line to return my call Patient phone number - [phone]  Spoke with Reinaldo Meeker at [facility] - she reports that the surgery center where she had been scheduled does not take her insurance.  She will contact the patient and help her find a center that [facility] goes to that will take her insurance    Tobacco Has decreased from 2 ppd to 1 ppd   OBJECTIVE:   BP 135/82   Pulse 74   Ht 5\' 7"  (1.702 m)   Wt 202 lb (91.6 kg)   SpO2 100%   BMI 31.64 kg/m   L Breast - small 3-5 cm opening at 3 oclock just beside the areola.  No specific mass   ASSESSMENT/PLAN:   Type 2 diabetes mellitus without complication, without long-term current use of insulin (HCC) Assessment & Plan: Well controlled Continue current meds  Orders: -     POCT glycosylated hemoglobin (Hb A1C)  Mass of left breast, unspecified quadrant Assessment & Plan: I called [facility] where she apparently had been scheduled for surgery according to the breast center.  According to patient the surgery had been cancelled due to insurance issues.   I left VM on [facility] nurse line to call me back on my cell .   Hopefully can figure out appropriate follow up    Essential hypertension Assessment & Plan: Controlled today   Tobacco use Assessment & Plan: Some improvement by report.  Recommend follow up with Dr Raymondo Band       Patient Instructions  Good to see you today - Thank you for coming in  Things we discussed today:  I  will contact [facility] and find out next steps.   If you do not hear from me in 2 days call back   Make an appointment to see Dr Raymondo Band about smoking   Your diabetes is doing well      Carney Living, MD [facility]

## 2023-04-24 NOTE — Assessment & Plan Note (Signed)
Controlled today 

## 2023-05-02 ENCOUNTER — Ambulatory Visit: Payer: Medicare Other | Admitting: Pharmacist

## 2023-05-06 ENCOUNTER — Emergency Department (HOSPITAL_COMMUNITY)
Admission: EM | Admit: 2023-05-06 | Discharge: 2023-05-06 | Disposition: A | Discharge status: Home / Self Care | Payer: Medicare Other | Attending: Emergency Medicine | Admitting: Emergency Medicine

## 2023-05-06 ENCOUNTER — Emergency Department (HOSPITAL_COMMUNITY): Payer: Medicare Other

## 2023-05-06 ENCOUNTER — Encounter (HOSPITAL_COMMUNITY): Payer: Self-pay

## 2023-05-06 ENCOUNTER — Other Ambulatory Visit: Payer: Self-pay

## 2023-05-06 DIAGNOSIS — R112 Nausea with vomiting, unspecified: Secondary | ICD-10-CM | POA: Insufficient documentation

## 2023-05-06 DIAGNOSIS — R070 Pain in throat: Secondary | ICD-10-CM | POA: Diagnosis not present

## 2023-05-06 DIAGNOSIS — D72829 Elevated white blood cell count, unspecified: Secondary | ICD-10-CM | POA: Insufficient documentation

## 2023-05-06 DIAGNOSIS — R109 Unspecified abdominal pain: Secondary | ICD-10-CM | POA: Diagnosis present

## 2023-05-06 DIAGNOSIS — R1013 Epigastric pain: Secondary | ICD-10-CM | POA: Diagnosis not present

## 2023-05-06 LAB — COMPREHENSIVE METABOLIC PANEL
ALT: 23 U/L (ref 0–44)
AST: 27 U/L (ref 15–41)
Albumin: 4.1 g/dL (ref 3.5–5.0)
Alkaline Phosphatase: 78 U/L (ref 38–126)
Anion gap: 9 (ref 5–15)
BUN: 11 mg/dL (ref 8–23)
CO2: 24 mmol/L (ref 22–32)
Calcium: 9.3 mg/dL (ref 8.9–10.3)
Chloride: 105 mmol/L (ref 98–111)
Creatinine, Ser: 0.77 mg/dL (ref 0.44–1.00)
GFR, Estimated: >60 mL/min (ref >60)
Glucose, Bld: 139 mg/dL — ABNORMAL HIGH (ref 70–99)
Potassium: 3.6 mmol/L (ref 3.5–5.1)
Sodium: 138 mmol/L (ref 135–145)
Total Bilirubin: 0.6 mg/dL (ref <1.2)
Total Protein: 7.4 g/dL (ref 6.5–8.1)

## 2023-05-06 LAB — CBC
HCT: 41.4 % (ref 36.0–46.0)
Hemoglobin: 14 g/dL (ref 12.0–15.0)
MCH: 31.5 pg (ref 26.0–34.0)
MCHC: 33.8 g/dL (ref 30.0–36.0)
MCV: 93 fL (ref 80.0–100.0)
Platelets: 274 10*3/uL (ref 150–400)
RBC: 4.45 MIL/uL (ref 3.87–5.11)
RDW: 13.6 % (ref 11.5–15.5)
WBC: 10.8 10*3/uL — ABNORMAL HIGH (ref 4.0–10.5)
nRBC: 0 % (ref 0.0–0.2)

## 2023-05-06 LAB — URINALYSIS, ROUTINE W REFLEX MICROSCOPIC
Bilirubin Urine: NEGATIVE
Glucose, UA: NEGATIVE mg/dL
Ketones, ur: NEGATIVE mg/dL
Nitrite: NEGATIVE
Protein, ur: NEGATIVE mg/dL
Specific Gravity, Urine: 1.011 (ref 1.005–1.030)
pH: 5 (ref 5.0–8.0)

## 2023-05-06 LAB — GROUP A STREP BY PCR: Group A Strep by PCR: NOT DETECTED

## 2023-05-06 LAB — TROPONIN I (HIGH SENSITIVITY): Troponin I (High Sensitivity): 3 ng/L (ref <18)

## 2023-05-06 LAB — LIPASE, BLOOD: Lipase: 36 U/L (ref 11–51)

## 2023-05-06 MED ORDER — ALUM & MAG HYDROXIDE-SIMETH 200-200-20 MG/5ML PO SUSP
30.0000 mL | Freq: Once | ORAL | Status: AC
  Administered 2023-05-06: 30 mL via ORAL
  Filled 2023-05-06: qty 30

## 2023-05-06 MED ORDER — SUCRALFATE 1 G PO TABS: 1.0000 g | ORAL_TABLET | Freq: Three times a day (TID) | ORAL | 0 refills | Status: DC

## 2023-05-06 MED ORDER — FAMOTIDINE IN NACL 20-0.9 MG/50ML-% IV SOLN
20.0000 mg | Freq: Once | INTRAVENOUS | Status: AC
  Administered 2023-05-06: 20 mg via INTRAVENOUS
  Filled 2023-05-06: qty 50

## 2023-05-06 MED ORDER — OMEPRAZOLE 20 MG PO CPDR: 20.0000 mg | DELAYED_RELEASE_CAPSULE | Freq: Every day | ORAL | 0 refills | Status: DC

## 2023-05-06 MED ORDER — ONDANSETRON 4 MG PO TBDP
4.0000 mg | ORAL_TABLET | Freq: Once | ORAL | Status: AC
  Administered 2023-05-06: 4 mg via ORAL
  Filled 2023-05-06: qty 1

## 2023-05-06 NOTE — ED Triage Notes (Signed)
Pt. Arrives POV for epigastric pain the radiates to the throat since 10pm. Pt. Endorses 4 episodes of vomiting. Last meal 7pm. Denies diarrhea.

## 2023-05-06 NOTE — ED Provider Notes (Signed)
[facility]-EMERGENCY DEPT [facility] Emergency Department Provider Note MRN:  [id number]  Arrival date & time: 05/06/23     Chief Complaint   Abdominal Pain   History of Present Illness   Jenna Wong is a 72 y.o. year-old female presents to the ED with chief complaint of throat pain and abdominal pain.  States that it initially felt like indigestion, but now it feels worse.  Onset of symptoms was around 7pm.  States that she felt like she was getting a sore throat.  States that it felt "like something was eating her throat."  She tried taking OTC antacid, denies any other treatments.  She reports associated nausea and vomiting.  Denies diarrhea.  Denies any fever.   History provided by patient.   Review of Systems  Pertinent positive and negative review of systems noted in HPI.    Physical Exam   Vitals:   05/06/23 0016 05/06/23 0132  BP: (!) 145/88 126/66  Pulse: (!) 103 84  Resp: 18 18  Temp: 98.3 F (36.8 C)   SpO2: 100% 100%    CONSTITUTIONAL:  non toxic-appearing, NAD NEURO:  Alert and oriented x 3, CN 3-12 grossly intact EYES:  eyes equal and reactive ENT/NECK:  Supple, no stridor, mildly erythematous oropharynx CARDIO:  normal rate on my exam, regular rhythm, appears well-perfused  PULM:  No respiratory distress, CTAB GI/GU:  non-distended, no focal tenderness MSK/SPINE:  No gross deformities, no edema, moves all extremities  SKIN:  no rash, atraumatic   *Additional and/or pertinent findings included in MDM below  Diagnostic and Interventional Summary    EKG Interpretation Date/Time:  Monday May 06 2023 01:56:41 EST Ventricular Rate:  85 PR Interval:  149 QRS Duration:  76 QT Interval:  359 QTC Calculation: 427 R Axis:   87  Text Interpretation: Sinus rhythm Ventricular trigeminy Borderline right axis deviation Low voltage, extremity and precordial leads Confirmed by Drema Pry [phone]) on 05/06/2023 3:38:46 AM       Labs Reviewed   COMPREHENSIVE METABOLIC PANEL - Abnormal; Notable for the following components:      Result Value   Glucose, Bld 139 (*)    All other components within normal limits  CBC - Abnormal; Notable for the following components:   WBC 10.8 (*)    All other components within normal limits  URINALYSIS, ROUTINE W REFLEX MICROSCOPIC - Abnormal; Notable for the following components:   Hgb urine dipstick MODERATE (*)    Leukocytes,Ua TRACE (*)    Bacteria, UA RARE (*)    All other components within normal limits  GROUP A STREP BY PCR  LIPASE, BLOOD  TROPONIN I (HIGH SENSITIVITY)    DG Chest Port 1 View  Final Result      Medications  alum & mag hydroxide-simeth (MAALOX/MYLANTA) 200-200-20 MG/5ML suspension 30 mL (30 mLs Oral Given 05/06/23 0216)  ondansetron (ZOFRAN-ODT) disintegrating tablet 4 mg (4 mg Oral Given 05/06/23 0216)  famotidine (PEPCID) IVPB 20 mg premix (0 mg Intravenous Stopped 05/06/23 0255)     Procedures  /  Critical Care Procedures  ED Course and Medical Decision Making  I have reviewed the triage vital signs, the nursing notes, and pertinent available records from the EMR.  Social Determinants Affecting Complexity of Care: Patient has no clinically significant social determinants affecting this chief complaint..   ED Course: Clinical Course as of 05/06/23 0414  Mon May 06, 2023  0237 DG Chest [place] 1 View No pneumothorax, opacity, or effusion seen by  me [RB]  0237 Comprehensive metabolic panel(!) Normal lipase, LFTs and t. Bili.  Doubt gallbladder disease or pancreatitis. [RB]  0238 CBC(!) Mild non-specific leukocytosis. [RB]  0413 Troponin I (High Sensitivity) Normal trop, doubt ACS, pain has been longer than 6 hours, don't need repeat [RB]  0413 Comprehensive metabolic panel(!) No significant electrolyte abnormality [RB]  0413 CBC(!) Mild, non-specific leukocytosis [RB]  0414 DG Chest Port 1 View No free air or opacity seen [RB]    Clinical Course User  Index [RB] Roxy Horseman, PA-C    Medical Decision Making Patient here with epigastric pain.  She describes it as a burning or gnawing sensation in her throat. She states that it feels like indigestion, but worse. Will check labs.    Patient feels improved after maalox and pepcid.    Labs are reassuring.  Will treat with omeprazole and carafate.  Diet recommendations given.  Problems Addressed: Epigastric pain: acute illness or injury  Amount and/or Complexity of Data Reviewed Labs: ordered. Decision-making details documented in ED Course. Radiology: ordered and independent interpretation performed. Decision-making details documented in ED Course. ECG/medicine tests: ordered.  Risk OTC drugs. Prescription drug management.         Consultants: No consultations were needed in caring for this patient.   Treatment and Plan: I considered admission due to patient's initial presentation, but after considering the examination and diagnostic results, patient will not require admission and can be discharged with outpatient follow-up.    Final Clinical Impressions(s) / ED Diagnoses     ICD-10-CM   1. Epigastric pain  R10.13       ED Discharge Orders          Ordered    sucralfate (CARAFATE) 1 g tablet  3 times daily with meals & bedtime        05/06/23 0358    omeprazole (PRILOSEC) 20 MG capsule  Daily        05/06/23 0358              Discharge Instructions Discussed with and Provided to Patient:   Discharge Instructions   None      Roxy Horseman, PA-C 05/06/23 0414    Nira Conn, MD 05/06/23 [phone]

## 2023-05-07 ENCOUNTER — Other Ambulatory Visit: Payer: Self-pay | Admitting: Family Medicine

## 2023-05-07 ENCOUNTER — Ambulatory Visit: Payer: Medicare Other | Admitting: Pharmacist

## 2023-05-07 DIAGNOSIS — E119 Type 2 diabetes mellitus without complications: Secondary | ICD-10-CM

## 2023-05-22 ENCOUNTER — Other Ambulatory Visit: Payer: Self-pay | Admitting: Family Medicine

## 2023-05-22 DIAGNOSIS — F418 Other specified anxiety disorders: Secondary | ICD-10-CM

## 2023-06-07 ENCOUNTER — Telehealth: Payer: Self-pay | Admitting: Pharmacist

## 2023-06-07 NOTE — Telephone Encounter (Signed)
Patient returns call and is willing to reschedule to discuss tobacco cessation.   Discussed date options and we decided on 1 week after her upcoming breast surgery.   Goal of intake reduction FROM pack lasts 1.5 days to less over the next few weeks.  Scheduled pharmacist visit on 07/04/2023 at 1:30   Total time with patient call and documentation of interaction: 12 minutes.

## 2023-06-07 NOTE — Telephone Encounter (Signed)
Patient contacted for follow-up of missed pharmacist appointment.   Left HIPAA compliant message requesting call back [phone] to schedule follow-up.  Additionally wished patient a happy birthday (yesterday)   Total time with patient call and documentation of interaction: 9 minutes.  F/U Phone call planned: None

## 2023-06-16 ENCOUNTER — Encounter (HOSPITAL_COMMUNITY): Payer: Self-pay

## 2023-06-16 ENCOUNTER — Other Ambulatory Visit: Payer: Self-pay

## 2023-06-16 ENCOUNTER — Emergency Department (HOSPITAL_COMMUNITY)
Admission: EM | Admit: 2023-06-16 | Discharge: 2023-06-16 | Disposition: A | Discharge status: Home / Self Care | Payer: Medicare Other | Attending: Emergency Medicine | Admitting: Emergency Medicine

## 2023-06-16 DIAGNOSIS — K2900 Acute gastritis without bleeding: Secondary | ICD-10-CM | POA: Insufficient documentation

## 2023-06-16 DIAGNOSIS — R1013 Epigastric pain: Secondary | ICD-10-CM | POA: Diagnosis present

## 2023-06-16 DIAGNOSIS — Z7982 Long term (current) use of aspirin: Secondary | ICD-10-CM | POA: Diagnosis not present

## 2023-06-16 DIAGNOSIS — E876 Hypokalemia: Secondary | ICD-10-CM | POA: Insufficient documentation

## 2023-06-16 LAB — URINALYSIS, ROUTINE W REFLEX MICROSCOPIC
Bilirubin Urine: NEGATIVE
Glucose, UA: NEGATIVE mg/dL
Ketones, ur: NEGATIVE mg/dL
Leukocytes,Ua: NEGATIVE
Nitrite: NEGATIVE
Protein, ur: NEGATIVE mg/dL
Specific Gravity, Urine: 1.021 (ref 1.005–1.030)
pH: 5 (ref 5.0–8.0)

## 2023-06-16 LAB — COMPREHENSIVE METABOLIC PANEL
ALT: 19 U/L (ref 0–44)
AST: 24 U/L (ref 15–41)
Albumin: 3.7 g/dL (ref 3.5–5.0)
Alkaline Phosphatase: 54 U/L (ref 38–126)
Anion gap: 8 (ref 5–15)
BUN: 16 mg/dL (ref 8–23)
CO2: 27 mmol/L (ref 22–32)
Calcium: 8.5 mg/dL — ABNORMAL LOW (ref 8.9–10.3)
Chloride: 102 mmol/L (ref 98–111)
Creatinine, Ser: 0.81 mg/dL (ref 0.44–1.00)
GFR, Estimated: >60 mL/min (ref >60)
Glucose, Bld: 161 mg/dL — ABNORMAL HIGH (ref 70–99)
Potassium: 3.3 mmol/L — ABNORMAL LOW (ref 3.5–5.1)
Sodium: 137 mmol/L (ref 135–145)
Total Bilirubin: 0.3 mg/dL (ref <1.2)
Total Protein: 7.1 g/dL (ref 6.5–8.1)

## 2023-06-16 LAB — CBC
HCT: 40 % (ref 36.0–46.0)
Hemoglobin: 13.6 g/dL (ref 12.0–15.0)
MCH: 31.8 pg (ref 26.0–34.0)
MCHC: 34 g/dL (ref 30.0–36.0)
MCV: 93.5 fL (ref 80.0–100.0)
Platelets: 266 10*3/uL (ref 150–400)
RBC: 4.28 MIL/uL (ref 3.87–5.11)
RDW: 14.4 % (ref 11.5–15.5)
WBC: 9.5 10*3/uL (ref 4.0–10.5)
nRBC: 0 % (ref 0.0–0.2)

## 2023-06-16 LAB — LIPASE, BLOOD: Lipase: 34 U/L (ref 11–51)

## 2023-06-16 LAB — TROPONIN I (HIGH SENSITIVITY): Troponin I (High Sensitivity): 3 ng/L (ref <18)

## 2023-06-16 MED ORDER — SUCRALFATE 1 GM/10ML PO SUSP
1.0000 g | Freq: Three times a day (TID) | ORAL | 0 refills | Status: DC
Start: 2023-06-16 — End: 2023-10-01

## 2023-06-16 MED ORDER — ALUM & MAG HYDROXIDE-SIMETH 200-200-20 MG/5ML PO SUSP
30.0000 mL | Freq: Once | ORAL | Status: AC
  Administered 2023-06-16: 30 mL via ORAL
  Filled 2023-06-16: qty 30

## 2023-06-16 MED ORDER — ONDANSETRON 4 MG PO TBDP
4.0000 mg | ORAL_TABLET | Freq: Once | ORAL | Status: AC | PRN
  Administered 2023-06-16: 4 mg via ORAL
  Filled 2023-06-16: qty 1

## 2023-06-16 MED ORDER — OMEPRAZOLE 40 MG PO CPDR: 40.0000 mg | DELAYED_RELEASE_CAPSULE | Freq: Every day | ORAL | 0 refills | Status: DC

## 2023-06-16 MED ORDER — DICYCLOMINE HCL 10 MG/5ML PO SOLN
10.0000 mg | Freq: Once | ORAL | Status: AC
  Administered 2023-06-16: 10 mg via ORAL
  Filled 2023-06-16: qty 5

## 2023-06-16 NOTE — Discharge Instructions (Addendum)
You are seen here today for epigastric pain.  You likely have inflammation your stomach esophagus from your reflux.  Please take the prescribed omeprazole daily and use the prescribed as needed medicine (Carafate) as needed.  Follow-up with primary care doctor and return to the ER with any new severe symptoms.

## 2023-06-16 NOTE — ED Triage Notes (Signed)
Pt had sudden onset of epigastric pain approx 2 hours ago. Pain radiates for abdomen to throat. Pt thought it was indigestion so she took some antacids with no relief. She had had approx 6 episodes of vomiting, as well.

## 2023-06-16 NOTE — ED Provider Notes (Signed)
[place] EMERGENCY DEPARTMENT AT [facility] Provider Note   CSN: [id number] Arrival date & time: 06/16/23  0124     History  Chief Complaint  Patient presents with   Abdominal Pain    Jenna Wong is a 72 y.o. female with history of reflux who presents with concern for epigastric and chest burning pain and pressure for the last 6 hours.  Has been taking Tums at home with minimal improvement.  Similar symptoms in the past resolved however she has been taking is only as needed.  Few episodes of NBNB emesis this evening as well diarrhea fevers or chills.  No chest pain.  She is hyperlipoidemia previously taking Naprosyn.  HPI     Home Medications Prior to Admission medications   Medication Sig Start Date End Date Taking? Authorizing Provider  aspirin 81 MG tablet Take 81 mg by mouth daily.    [provider]  atorvastatin (LIPITOR) 40 MG tablet Take 1 tablet by mouth once daily 04/22/23   Lockie Mola, MD  Dulaglutide (TRULICITY) 1.5 MG/0.5ML SOPN Inject 1.5 mg into the skin once a week. 12/11/22   McDiarmid, Leighton Roach, MD  fluticasone (FLONASE) 50 MCG/ACT nasal spray Place 1 spray into both nostrils daily. 1 spray in each nostril every day 08/10/21   Cathleen Corti, MD  gabapentin (NEURONTIN) 100 MG capsule TAKE 1 CAPSULE BY MOUTH THREE TIMES DAILY 03/26/22   Lockie Mola, MD  Lancet Devices (ONE TOUCH DELICA LANCING DEV) MISC 1 applicator by Does not apply route every morning. 11/28/22   Lockie Mola, MD  metFORMIN (GLUCOPHAGE) 1000 MG tablet TAKE 1 TABLET BY MOUTH TWICE DAILY WITH A MEAL 05/08/23   Lockie Mola, MD  naproxen (NAPROSYN) 500 MG tablet Take 1 tablet (500 mg total) by mouth 2 (two) times daily with a meal. 02/06/23   Chambliss, Estill Batten, MD  omeprazole (PRILOSEC) 20 MG capsule Take 1 capsule (20 mg total) by mouth daily. 05/06/23   Roxy Horseman, PA-C  sertraline (ZOLOFT) 100 MG tablet Take 1 tablet (100 mg total) by mouth See  admin instructions. TAKE 1 TABLET BY MOUTH ONCE DAILY NIGHTLY 05/25/23   Lockie Mola, MD  sucralfate (CARAFATE) 1 g tablet Take 1 tablet (1 g total) by mouth 4 (four) times daily -  with meals and at bedtime. 05/06/23   Roxy Horseman, PA-C  vitamin B-12 (CYANOCOBALAMIN) 1000 MCG tablet Take 1,000 mcg by mouth daily.    [provider]      Allergies    Patient has no known allergies.    Review of Systems   Review of Systems  Gastrointestinal:  Positive for abdominal pain, nausea and vomiting.    Physical Exam Updated Vital Signs BP 109/71   Pulse 91   Temp 98.3 F (36.8 C) (Oral)   Resp 16   SpO2 94%  Physical Exam Vitals and nursing note reviewed.  Constitutional:      Appearance: She is not ill-appearing or toxic-appearing.  HENT:     Head: Normocephalic and atraumatic.     Mouth/Throat:     Mouth: Mucous membranes are moist.     Pharynx: No oropharyngeal exudate or posterior oropharyngeal erythema.  Eyes:     General:        Right eye: No discharge.        Left eye: No discharge.     Conjunctiva/sclera: Conjunctivae normal.  Cardiovascular:     Rate and Rhythm: Normal rate and regular rhythm.  Pulses: Normal pulses.  Pulmonary:     Effort: Pulmonary effort is normal. No respiratory distress.     Breath sounds: Normal breath sounds. No wheezing or rales.  Abdominal:     General: Bowel sounds are normal. There is no distension.     Palpations: Abdomen is soft.     Tenderness: There is abdominal tenderness in the epigastric area. There is no right CVA tenderness, left CVA tenderness, guarding or rebound.  Musculoskeletal:        General: No deformity.     Cervical back: Neck supple.  Skin:    General: Skin is warm and dry.  Neurological:     Mental Status: She is alert. Mental status is at baseline.  Psychiatric:        Mood and Affect: Mood normal.     ED Results / Procedures / Treatments   Labs (all labs ordered are listed, but only  abnormal results are displayed) Labs Reviewed  COMPREHENSIVE METABOLIC PANEL - Abnormal; Notable for the following components:      Result Value   Potassium 3.3 (*)    Glucose, Bld 161 (*)    Calcium 8.5 (*)    All other components within normal limits  LIPASE, BLOOD  CBC  URINALYSIS, ROUTINE W REFLEX MICROSCOPIC  TROPONIN I (HIGH SENSITIVITY)    EKG None  Radiology No results found.  Procedures Procedures    Medications Ordered in ED Medications  alum & mag hydroxide-simeth (MAALOX/MYLANTA) 200-200-20 MG/5ML suspension 30 mL (has no administration in time range)  dicyclomine (BENTYL) 10 MG/5ML solution 10 mg (has no administration in time range)  ondansetron (ZOFRAN-ODT) disintegrating tablet 4 mg (4 mg Oral Given 06/16/23 0220)    ED Course/ Medical Decision Making/ A&P                                 Medical Decision Making 72 year old female who presents with epigastric pain.  Mildly hypertensive on intake, vital signs otherwise normal.  Cardiopulmonary exam unremarkable, abdominal exam with mild epigastric tenderness palpation without rebound or guarding.  Patient resting comfortably in her stretcher at time of my arrival to the bedside with minimal persistent burning pain in the epigastrium.  Differential diagnosis of epigastric pain includes but is not limited to: Functional or nonulcer dyspepsia (MCC), PUD, GERD, Gastritis, (NSAIDs, alcohol, stress, H. pylori, pernicious anemia), pancreatitis / pancreatic cancer, overeating indigestion (high-fat foods, coffee), drugs (aspirin, antibiotics (eg, macrolides, metronidazole), corticosteroids, digoxin, narcotics, theophylline), gastroparesis, gastric volvulus, gastric cancer, lactose intolerance, malabsorption, parasitic infection (Giardia, Strongyloides, Ascaris), abdominal hernia, intestinal ischemia, esophageal rupture,  cholelithiasis /choledocholithiasis / cholangitis, hepatitis, ACS, pericarditis, pneumonia,  pregnancy.   Amount and/or Complexity of Data Reviewed Labs: ordered.    Details: CBC unremarkable, CMP with mild hypokalemia of 3.3.  Troponin negative, lipase normal.     ECG/medicine tests:     Details: EKG with sinus rhythm without ischemic changes.  Risk OTC drugs. Prescription drug management.   Clinical picture most consistent with gastritis, GERD.  Will discharge prescription for Carafate and PPI.  Recommend close outpatient follow-up with PCP.  Strict return precautions given.  Clinical concern for emergent underlying condition that would warrant further ED workup or inpatient management is exceedingly low.  [place] and her sister  voiced understanding of her medical evaluation and treatment plan. Each of their questions answered to their expressed satisfaction.  Return precautions were given.  Patient is well-appearing, stable,  and was discharged in good condition.  This chart was dictated using voice recognition software, Dragon. Despite the best efforts of this provider to proofread and correct errors, errors may still occur which can change documentation meaning.        Final Clinical Impression(s) / ED Diagnoses Final diagnoses:  None    Rx / DC Orders ED Discharge Orders     None         Sherrilee Gilles 06/16/23 0542    Nira Conn, MD 06/16/23 [phone]

## 2023-06-22 ENCOUNTER — Other Ambulatory Visit: Payer: Self-pay | Admitting: Family Medicine

## 2023-06-22 DIAGNOSIS — E119 Type 2 diabetes mellitus without complications: Secondary | ICD-10-CM

## 2023-06-28 ENCOUNTER — Encounter: Payer: Self-pay | Admitting: Family Medicine

## 2023-06-28 ENCOUNTER — Ambulatory Visit: Payer: Medicare Other | Admitting: Family Medicine

## 2023-06-28 VITALS — BP 122/60 | HR 92 | Ht 67.0 in | Wt 207.0 lb

## 2023-06-28 DIAGNOSIS — L299 Pruritus, unspecified: Secondary | ICD-10-CM | POA: Diagnosis not present

## 2023-06-28 NOTE — Patient Instructions (Signed)
 It was wonderful to see you today.  Please bring ALL of your medications with you to every visit.   Today we talked about:  Itching - Try running the laundry without the fabric softener. Then try the clothes and see if you still have itching. At this point you can try benadryl . If it works you might have an allergy. You can take allegra as needed for this. I will follow up these labs and see if there are any abnormalities here in the meantime.   Please follow up in 1 month   Thank you for choosing [facility].   Please call [phone] with any questions about today's appointment.  Please be sure to schedule follow up at the front desk before you leave today.   Areta Saliva, MD  Family Medicine

## 2023-06-28 NOTE — Progress Notes (Signed)
    SUBJECTIVE:   CHIEF COMPLAINT / HPI:   [place] application for trulicity   Patient here to drop off paperwork to get financial aid for trulicity . No concerns.   Generalized pruritus  Patient has generalized pruritus without rash. Has itchiness on palms as well, but not the soles of her feet. She has tried sarna cream, calamine lotion, but nothing has helped. Has been going on for about a month or so.  Says she did start using a new fabric softener.  Has not tried any other medication.   PERTINENT  PMH / PSH: HTN, H/o cholecystectomy, T2DM  OBJECTIVE:   BP (!) 121/107   Pulse 92   Ht 5' 7 (1.702 m)   Wt 207 lb (93.9 kg)   SpO2 99%   BMI 32.42 kg/m   General: well appearing, in no acute distress CV: RRR, radial pulses equal and palpable, no BLE edema  Resp: Normal work of breathing on room air, CTAB Abd: Soft, non tender, non distended, no hepatomegaly  Derm: no rashes, erythema, or synovitis    ASSESSMENT/PLAN:   Assessment & Plan Generalized pruritus Most likely a reaction to the fabric softener. If does not improve after switching or stopping the fabric softener, recommended patient try allegra. Recommended patient have friend with her and advised that allegra could make patient dizzy.  - Bile acids, Hepatic function panel  Follow up in one month      Areta Saliva, MD [facility]

## 2023-06-30 LAB — HEPATIC FUNCTION PANEL
ALT: 16 [IU]/L (ref 0–32)
AST: 22 [IU]/L (ref 0–40)
Albumin: 4 g/dL (ref 3.9–4.9)
Alkaline Phosphatase: 71 [IU]/L (ref 44–121)
Bilirubin Total: 0.5 mg/dL (ref 0.0–1.2)
Bilirubin, Direct: 0.2 mg/dL (ref 0.00–0.40)
Total Protein: 6.3 g/dL (ref 6.0–8.5)

## 2023-06-30 LAB — BILE ACIDS, TOTAL: Bile Acids Total: 4.4 umol/L (ref 0.0–10.0)

## 2023-07-04 ENCOUNTER — Ambulatory Visit: Payer: Medicare Other | Admitting: Pharmacist

## 2023-07-04 LAB — HM DIABETES EYE EXAM

## 2023-07-05 ENCOUNTER — Other Ambulatory Visit: Payer: Self-pay | Admitting: Family Medicine

## 2023-07-05 ENCOUNTER — Telehealth: Payer: Self-pay | Admitting: Family Medicine

## 2023-07-05 DIAGNOSIS — E119 Type 2 diabetes mellitus without complications: Secondary | ICD-10-CM

## 2023-07-05 MED ORDER — TRULICITY 1.5 MG/0.5ML ~~LOC~~ SOAJ: 1.5000 mg | SUBCUTANEOUS | 4 refills | Status: DC

## 2023-07-05 NOTE — Telephone Encounter (Signed)
 Patient called to check on the status of her paperwork for her trulicity that she left with her doctor on 06/28/23. Asks is someone could please call her when it is ready.

## 2023-07-09 NOTE — Telephone Encounter (Signed)
 Patient has been informed. Faxing over RX to [employer] [facility] [employer] today. Penni Bombard CMA

## 2023-07-17 ENCOUNTER — Other Ambulatory Visit: Payer: Self-pay | Admitting: Family Medicine

## 2023-07-23 ENCOUNTER — Other Ambulatory Visit: Payer: Self-pay

## 2023-07-23 MED ORDER — OMEPRAZOLE 40 MG PO CPDR: 40.0000 mg | DELAYED_RELEASE_CAPSULE | Freq: Every day | ORAL | 0 refills | Status: DC

## 2023-08-04 ENCOUNTER — Other Ambulatory Visit: Payer: Self-pay | Admitting: Family Medicine

## 2023-08-04 DIAGNOSIS — E119 Type 2 diabetes mellitus without complications: Secondary | ICD-10-CM

## 2023-08-05 ENCOUNTER — Telehealth: Payer: Self-pay | Admitting: Pharmacist

## 2023-08-05 NOTE — Telephone Encounter (Signed)
 Reviewed and agree with Dr Macky Lower plan.

## 2023-08-05 NOTE — Telephone Encounter (Signed)
 Patient contacted for follow/up of tobacco intake reduction.   Since last contact patient reports stress and continued smoking. Willing to reschedule appointment for later in the week.   Total time with patient call and documentation of interaction: 7 minutes.

## 2023-08-09 ENCOUNTER — Ambulatory Visit: Payer: Medicare Other | Admitting: Pharmacist

## 2023-08-19 ENCOUNTER — Telehealth: Payer: Self-pay | Admitting: Pharmacist

## 2023-08-19 NOTE — Telephone Encounter (Signed)
 Attempted to contact patient for follow-up of tobacco cessation.   Left HIPAA compliant voice mail requesting call back to direct phone: [phone]  Total time with patient call and documentation of interaction: 2 minutes.

## 2023-08-23 ENCOUNTER — Other Ambulatory Visit (HOSPITAL_COMMUNITY): Payer: Self-pay

## 2023-08-23 ENCOUNTER — Telehealth: Payer: Self-pay

## 2023-08-23 DIAGNOSIS — E119 Type 2 diabetes mellitus without complications: Secondary | ICD-10-CM

## 2023-08-23 MED ORDER — TRULICITY 1.5 MG/0.5ML ~~LOC~~ SOAJ: 1.5000 mg | SUBCUTANEOUS | 4 refills | Status: DC

## 2023-08-23 NOTE — Telephone Encounter (Signed)
Trulicity refilled

## 2023-08-23 NOTE — Telephone Encounter (Signed)
 Patient reached out regarding [employer] patient assistance for [employer].   Her paperwork was given to her provider in January at her appt. Not sure if I rec'd it. Paperwork may be in my office.  In the meantime, I've informed the patient that a test claim shows her trulicity is covered for $12 for a month supply. Patient would like for an RX to be sent to her pharmacy ([facility] [address]). She is aware to reach out to me if the copay becomes to high. We will continue with PAP if needed.

## 2023-09-16 ENCOUNTER — Other Ambulatory Visit: Payer: Self-pay | Admitting: Family Medicine

## 2023-09-16 DIAGNOSIS — E119 Type 2 diabetes mellitus without complications: Secondary | ICD-10-CM

## 2023-09-23 ENCOUNTER — Telehealth: Payer: Self-pay | Admitting: Pharmacist

## 2023-09-23 NOTE — Telephone Encounter (Signed)
 Patient contacted for follow/up of tobacco intake reduction / cessation attempt.   Since last appointment patient reports taking 1 pack per day. Patient reports currently taking care of her sister who recently underwent open heart surgery - additional stressor. Scheduled appointment on 4/8 for in-person follow-up.  Medications currently being used; none  Total time with patient call and documentation of interaction: 4 minutes.

## 2023-09-24 NOTE — Telephone Encounter (Signed)
 Reviewed and agree with Dr Macky Lower plan.

## 2023-10-01 ENCOUNTER — Encounter: Payer: Self-pay | Admitting: Pharmacist

## 2023-10-01 ENCOUNTER — Ambulatory Visit: Admitting: Pharmacist

## 2023-10-01 VITALS — BP 123/71 | HR 82 | Wt 199.2 lb

## 2023-10-01 DIAGNOSIS — Z720 Tobacco use: Secondary | ICD-10-CM

## 2023-10-01 DIAGNOSIS — F418 Other specified anxiety disorders: Secondary | ICD-10-CM | POA: Diagnosis not present

## 2023-10-01 MED ORDER — SUCRALFATE 1 GM/10ML PO SUSP: 1.0000 g | Freq: Three times a day (TID) | ORAL | 0 refills | Status: AC

## 2023-10-01 MED ORDER — OMEPRAZOLE 40 MG PO CPDR: 40.0000 mg | DELAYED_RELEASE_CAPSULE | Freq: Every day | ORAL | 0 refills | Status: DC

## 2023-10-01 MED ORDER — SERTRALINE HCL 100 MG PO TABS: 200.0000 mg | ORAL_TABLET | Freq: Every day | ORAL | 3 refills | Status: DC

## 2023-10-01 NOTE — Patient Instructions (Signed)
 Nice to see you today!   Medication Changes: Increase Sertraline to 200 mg (2 tablets) daily  Continue all other medication the same.    Tobacco Patient Instructions  Quitting smoking is one of the most important decisions you can make for your current and future health. Consider what you dislike about smoking and how quitting could personally benefit you. Try to cut down. Aim for reducing the amount you smoke by 5 cigarettes over the next 4 weeks.   Starting today, Be a Quitter!  Remind yourself why you want to quit.  Delay your first cigarette of the day for as long as possible.  Start cleaning out all pockets, drawers, and your car of cigarettes.  Getting Through the Cravings Once You Are Smoke Free: Each craving will last about 10 minutes, whether or not you smoke. Here's how to get through the cravings without cigarettes:  DELAY: Tell yourself that you'll wait for the next craving. Do it every time! DEEP BREATHS: One reason smoking feels good is because you breathe in deeply to inhale. Take four slow, deep breaths and feel the relaxation without the hamful effects of cigarettes. DRINK WATER: Drink a glass of cool water. It will give your hands and mouth something to do and will help flush the nicotine out of your system faster. DIVERT: Do something else -- brush your teeth, take a walk, call a friend who can offer you support. Just moving onto something other than thinking about cigarettes will move you through the craving.   Frequently Asked Questions  What can I do when I get the urge to smoke? To get through the urge to smoke, try the following:  Review your reasons for quitting and think of all the benefits to your health, your finances, and your family.  Remind yourself that there is no such thing as just one cigarette -- or even one puff.  Ride out the desire to smoke. Use the 4 Os -- Delay, Deep Breaths, Drink Water and Divert to get you through. The craving will go away  eventually. Do not fool yourself into thinking you can have just one cigarette.  Any tips on how to deal with stress? Stress is a natural part of life. The key is to deal with it without reaching for a cigarette. Taking deep breaths, counting backwards from 10 and asking yourself 1-how big a deal is this?"  Writing down your feelings, talking with a friend and doing things like positive self-talk and meditation are some other ways that people deal with daily stress.  What if I start smoking again? Slips happen. Most people try to quit smoking a few times before they are successful. Don't beat yourself up if this happens to you! Ask yourself if this was a slip or a relapse. A slip is a one-time mistake that is quickly corrected. A relapse is going back to your old smoking habits.   If you slip, don't give up. Think of it as a learning experience. Ask yourself what went wrong and renew your commitment to staying away from smoking for good.  If you relapse, try not to get discouraged. Ask yourself the question "What caused me to start smoking?" Figure out what helped you and what didn't when you tried to quit. Knowing why you relapsed is useful information for your next attempt to quit.

## 2023-10-01 NOTE — Assessment & Plan Note (Signed)
 Tobacco use disorder with severe nicotine dependence of 40+ years duration in a patient who is excellent candidate for success because of personal motivation to quit.  Recent stress of hospitalization and surgery of younger sister who is still recovering at patient's home are complicating factors.  Patient verbalized goal of quitting and we discussed a reward of traveling to the beach IF she quits as a motivator.   -Discussed decreasing cigarettes from 1/2 PPD to 1/4 PPD. -Provided information on 1 800-QUIT NOW support program.  - Follow-up 3-4 weeks

## 2023-10-01 NOTE — Progress Notes (Signed)
 Reviewed and agree with Dr Macky Lower plan.

## 2023-10-01 NOTE — Progress Notes (Signed)
   S:   Chief Complaint  Patient presents with   Medication Management    Tobacco intake reduction   73 y.o. female who presents for evaluation/assistance with tobacco dependence.    Patient was referred and last seen by Primary Care Provider, Dr. Marsh Dolly, on 01/-3/25.   Brand smoked [place]. Number of cigarettes/day 10 Estimated nicotine content per cigarette (mg) 1  Estimated nicotine intake per day 10mg    Medications used in past cessation efforts include: NRT (patch, gum, lozenge), varenicline  Rates IMPORTANCE of quitting as high.   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  Recent stress of hospitalization and surgery of younger sister who is still recovering at patient's home are complicating factors.  Patient verbalized goal of quitting and we discussed a reward of traveling to the beach IF she quits as a motivator.   -Discussed decreasing cigarettes from 1/2 PPD to 1/4 PPD. -Provided information on 1 800-QUIT NOW support program.  - Follow-up 3-4 weeks  Refilled sertraline, sucralfate and omeprazole - maintenance therapies.   Written patient instructions provided. Patient verbalized understanding of treatment plan.  Total time in face to face counseling 21 minutes.    Follow-up:  Pharmacist 10/29/2023 PCP clinic visit TBD Patient seen with Threasa Heads, PharmD Candidate and Darolyn Rua, PharmD Candidate.

## 2023-10-29 ENCOUNTER — Ambulatory Visit: Admitting: Pharmacist

## 2023-10-29 ENCOUNTER — Encounter: Payer: Self-pay | Admitting: Pharmacist

## 2023-10-29 VITALS — BP 142/76 | HR 97 | Wt 196.8 lb

## 2023-10-29 DIAGNOSIS — Z720 Tobacco use: Secondary | ICD-10-CM

## 2023-10-29 NOTE — Assessment & Plan Note (Signed)
 Tobacco use disorder with severe nicotine  dependence of 40+ years duration in a patient who is excellent candidate for success because of personal motivation to quit and progress toward quitting, now smoking 3-4 cigarettes per day.  Recent stress of hospitalization and surgery of younger sister who is still recovering at patient's home are complicating factors.  Patient's sister will likely move out of patient's home after recovery within the next 4-6 weeks.  -Discussed decreasing cigarettes from 3-4 cigs. per day.  Discussed avoiding smoking in the car, and also smoking only outside of the house.  -Provided information on 1 800-QUIT NOW support program.  -Patient has switched brands to less desirable/cheaper cigarette.  -Goal to quit smoking by July 2025.

## 2023-10-29 NOTE — Patient Instructions (Signed)
 Nice to see you today!   Medication Changes: - Avoid smoking inside the house and car; try to only smoke outside   - Please call 1-800-QUIT-NOW if you are having a difficult day or would like to speak to someone!   - Continue all other medication the same.   Tobacco Patient Instructions  Quitting smoking is one of the most important decisions you can make for your current and future health. Consider what you dislike about smoking and how quitting could personally benefit you. Try to cut down.   Aim to reduce the amount you smoke over the next 4 weeks.  My target quit date is: July   Starting today, Be a Quitter!  Remind yourself why you want to quit.  Delay your first cigarette of the day for as long as possible.  Start cleaning out all pockets, drawers, and your car of cigarettes.  Getting Through the Cravings Once You Are Smoke Free: Each craving will last about 10 minutes, whether or not you smoke. Here's how to get through the cravings without cigarettes:  DELAY: Tell yourself that you'll wait for the next craving. Do it every time! DEEP BREATHS: One reason smoking feels good is because you breathe in deeply to inhale. Take four slow, deep breaths and feel the relaxation without the hamful effects of cigarettes. DRINK WATER: Drink a glass of cool water. It will give your hands and mouth something to do and will help flush the nicotine  out of your system faster. DIVERT: Do something else -- brush your teeth, take a walk, call a friend who can offer you support. Just moving onto something other than thinking about cigarettes will move you through the craving.   Frequently Asked Questions  What can I do when I get the urge to smoke? To get through the urge to smoke, try the following:  Review your reasons for quitting and think of all the benefits to your health, your finances, and your family.  Remind yourself that there is no such thing as just one cigarette -- or even one  puff.  Ride out the desire to smoke. Use the 4 Os -- Delay, Deep Breaths, Drink Water and Divert to get you through. The craving will go away eventually. Do not fool yourself into thinking you can have just one cigarette.  Any tips on how to deal with stress? Stress is a natural part of life. The key is to deal with it without reaching for a cigarette. Taking deep breaths, counting backwards from 10 and asking yourself 1-how big a deal is this?"  Writing down your feelings, talking with a friend and doing things like positive self-talk and meditation are some other ways that people deal with daily stress.  What if I start smoking again? Slips happen. Most people try to quit smoking a few times before they are successful. Don't beat yourself up if this happens to you! Ask yourself if this was a slip or a relapse. A slip is a one-time mistake that is quickly corrected. A relapse is going back to your old smoking habits.   If you slip, don't give up. Think of it as a learning experience. Ask yourself what went wrong and renew your commitment to staying away from smoking for good.  If you relapse, try not to get discouraged. Ask yourself the question "What caused me to start smoking?" Figure out what helped you and what didn't when you tried to quit. Knowing why you relapsed is useful  information for your next attempt to quit.  Please bring all medications to your clinic visits.  Please arrive 10-15 minutes prior to your scheduled visit time.

## 2023-10-29 NOTE — Progress Notes (Signed)
   S:   Chief Complaint  Patient presents with   Medication Management    Tobacco cessation follow-up   73 y.o. female who presents for evaluation/assistance with tobacco dependence.     Patient was referred and last seen by Primary Care Provider, Dr. Mauri Sous, on 06/28/23.    Brand smoked Newports. Number of cigarettes/day 10 Estimated nicotine  content per cigarette (mg) 1  Estimated nicotine  intake per day 10mg     Medications used in past cessation efforts include: NRT (patch, gum, lozenge), varenicline    Rates IMPORTANCE of quitting as high.    Most common triggers to use tobacco include; with coffee in AM, after meals, still smoking in car at times.    Motivation to quit: money, improve lung function  Current exercise includes walking (8-10 minutes around the block) Confidence in quitting in the next two months 8/10  O: Clinical ASCVD: Yes  The 10-year ASCVD risk score (Arnett DK, et al., 2019) is: 38.9%   Values used to calculate the score:     Age: 58 years     Sex: Female     Is Non-Hispanic African American: Yes     Diabetic: Yes     Tobacco smoker: Yes     Systolic Blood Pressure: 142 mmHg     Is BP treated: No     HDL Cholesterol: 40 mg/dL     Total Cholesterol: 136 mg/dL  Review of Systems  Respiratory:  Positive for shortness of breath.   All other systems reviewed and are negative.   Physical Exam Vitals reviewed.  Constitutional:      Appearance: Normal appearance.  Pulmonary:     Effort: Pulmonary effort is normal.  Neurological:     Mental Status: She is alert.  Psychiatric:        Mood and Affect: Mood normal.        Behavior: Behavior normal.        Thought Content: Thought content normal.    A/P: Tobacco use disorder with severe nicotine  dependence of 40+ years duration in a patient who is excellent candidate for success because of personal motivation to quit and progress toward quitting, now smoking 3-4 cigarettes per day.  Recent stress of  hospitalization and surgery of younger sister who is still recovering at patient's home are complicating factors.  Patient's sister will likely move out of patient's home after recovery within the next 4-6 weeks.  -Discussed decreasing cigarettes from 3-4 cigs. per day.  Discussed avoiding smoking in the car, and also smoking only outside of the house.  -Provided information on 1 800-QUIT NOW support program.  -Patient has switched brands to less desirable/cheaper cigarette.  -Goal to quit smoking by July 2025.   Written patient instructions provided. Patient verbalized understanding of treatment plan.  Total time in face to face counseling 24 minutes.    Follow-up:  Pharmacist 12/10/2023 PCP clinic visit PRN Patient seen with Noah [place], PharmD Candidate and Volney Grumbles, PharmD, PGY-1 pharmacy resident.

## 2023-11-06 ENCOUNTER — Other Ambulatory Visit: Payer: Self-pay | Admitting: Student

## 2023-11-06 DIAGNOSIS — E119 Type 2 diabetes mellitus without complications: Secondary | ICD-10-CM

## 2023-11-06 NOTE — Progress Notes (Signed)
 Reviewed and agree with Dr Macky Lower plan.

## 2023-12-10 ENCOUNTER — Ambulatory Visit: Admitting: Pharmacist

## 2023-12-16 ENCOUNTER — Telehealth: Payer: Self-pay | Admitting: Pharmacist

## 2023-12-16 NOTE — Telephone Encounter (Signed)
 Patient contacted for follow/up of tobacco intake reduction / cessation attempt.   Since last contact patient reports she has changed from NEWPORT cigarettes to CROWN which are lower in nicotine /tar.  She also reports smoking < 10 cigarettes per day.  Currently not on any tobacco cessation tx.     Her pain complaint during the call was knee pain.  Additionally, she reports a rash on her back.  She desires both to be evaluated.  I attempted to schedule with PCP but schedule is full/blocked.     Visit this week with Dr. Dameron  6/26 at 3:10 for evaluation   Patient denies any significant side effects from tobacco cessation therapy.   Continues to rates IMPORTANCE of quitting tobacco as high.  She reports a goal of quitting in 5 weeks (by the end of July 2025)  Patient reports 1 800-QUIT NOW support program has been helpful with progress in cutting back.  She requested visit with me when she is able to quit completely.  I shared that I look forward to that visit.   Total time with patient call and documentation of interaction: 14 minutes.

## 2023-12-17 NOTE — Telephone Encounter (Signed)
 Reviewed and agree with Dr Macky Lower plan.

## 2023-12-19 ENCOUNTER — Ambulatory Visit (INDEPENDENT_AMBULATORY_CARE_PROVIDER_SITE_OTHER): Admitting: Student

## 2023-12-19 VITALS — BP 119/70 | HR 96 | Wt 201.0 lb

## 2023-12-19 DIAGNOSIS — M25561 Pain in right knee: Secondary | ICD-10-CM

## 2023-12-19 DIAGNOSIS — L509 Urticaria, unspecified: Secondary | ICD-10-CM | POA: Insufficient documentation

## 2023-12-19 NOTE — Progress Notes (Signed)
    SUBJECTIVE:   CHIEF COMPLAINT / HPI:   Right knee pain Dull pain for about 1 year or longer Using Voltaren gel without much relief X-ray right knee 12/2022 with mild tricompartmental osteoarthritis Has noticed some redness and swelling over the past month  Urticaria Has itching over  No fevers, night sweats Has lost 4 lbs on Trulicity  No changes in diet No new perfumes, detergents Has been using Vaseline and anti-itch cream (Benadryl ) without relief  PERTINENT  PMH / PSH: Hyperlipidemia, tobacco use, history of right knee pain  OBJECTIVE:   BP 119/70   Pulse 96   Wt 201 lb (91.2 kg)   SpO2 95%   BMI 31.48 kg/m   General: NAD, well appearing Cardiac: RRR Neuro: A&O Respiratory: normal WOB on RA. No wheezing or crackles on auscultation, good lung sounds throughout MSK, right knee: Mild to moderate suprapatellar effusion, slightly warm with palpation and increased erythema.  Gait normal.  Normal range of motion with flexion and extension compared to left.  Crepitus appreciated with range of motion. Skin: Dry, scaly skin diffusely over extremities. No lesions.  ASSESSMENT/PLAN:   Right knee pain Chronic issue, likely secondary to osteoarthritis However given erythema, swelling, I am hesitant to provide corticosteroid injection for OA until blood work results (CBC, uric acid to evaluate for gout, CMP) Low suspicion for septic arthritis, as I would expect more severe symptoms and possibly systemic symptoms at this point.  Urticaria Chronic Check CMP and CBC today No red flag signs or symptoms suspicious for malignancy Discussed topical emollient, may simply have xerostomia or eczema     Barabara Dama, DO [facility]

## 2023-12-19 NOTE — Patient Instructions (Signed)
 It was great seeing you today.  As we discussed, - Labs today - Ice your knee 10 -15 minutes three times daily   If you have any questions or concerns, please feel free to call the clinic.   Have a wonderful day,  Dr. Barabara Dama [facility] [phone]

## 2023-12-19 NOTE — Assessment & Plan Note (Signed)
 Chronic Check CMP and CBC today No red flag signs or symptoms suspicious for malignancy Discussed topical emollient, may simply have xerostomia or eczema

## 2023-12-19 NOTE — Assessment & Plan Note (Signed)
 Chronic issue, likely secondary to osteoarthritis However given erythema, swelling, I am hesitant to provide corticosteroid injection for OA until blood work results (CBC, uric acid to evaluate for gout, CMP) Low suspicion for septic arthritis, as I would expect more severe symptoms and possibly systemic symptoms at this point.

## 2023-12-20 LAB — CBC WITH DIFFERENTIAL/PLATELET
Basophils Absolute: 0 10*3/uL (ref 0.0–0.2)
Basos: 0 %
EOS (ABSOLUTE): 0.1 10*3/uL (ref 0.0–0.4)
Eos: 1 %
Hematocrit: 42.3 % (ref 34.0–46.6)
Hemoglobin: 13.8 g/dL (ref 11.1–15.9)
Immature Grans (Abs): 0 10*3/uL (ref 0.0–0.1)
Immature Granulocytes: 0 %
Lymphocytes Absolute: 4.9 10*3/uL — ABNORMAL HIGH (ref 0.7–3.1)
Lymphs: 43 %
MCH: 31.4 pg (ref 26.6–33.0)
MCHC: 32.6 g/dL (ref 31.5–35.7)
MCV: 96 fL (ref 79–97)
Monocytes Absolute: 0.6 10*3/uL (ref 0.1–0.9)
Monocytes: 6 %
Neutrophils Absolute: 5.7 10*3/uL (ref 1.4–7.0)
Neutrophils: 50 %
Platelets: 259 10*3/uL (ref 150–450)
RBC: 4.4 x10E6/uL (ref 3.77–5.28)
RDW: 13.1 % (ref 11.7–15.4)
WBC: 11.4 10*3/uL — ABNORMAL HIGH (ref 3.4–10.8)

## 2023-12-20 LAB — COMPREHENSIVE METABOLIC PANEL WITH GFR
ALT: 23 IU/L (ref 0–32)
AST: 27 IU/L (ref 0–40)
Albumin: 4.5 g/dL (ref 3.9–4.9)
Alkaline Phosphatase: 74 IU/L (ref 44–121)
BUN/Creatinine Ratio: 12 (ref 12–28)
BUN: 10 mg/dL (ref 8–27)
Bilirubin Total: 0.4 mg/dL (ref 0.0–1.2)
CO2: 22 mmol/L (ref 20–29)
Calcium: 10.3 mg/dL (ref 8.7–10.3)
Chloride: 102 mmol/L (ref 96–106)
Creatinine, Ser: 0.84 mg/dL (ref 0.57–1.00)
Globulin, Total: 2.3 g/dL (ref 1.5–4.5)
Glucose: 92 mg/dL (ref 70–99)
Potassium: 4.5 mmol/L (ref 3.5–5.2)
Sodium: 141 mmol/L (ref 134–144)
Total Protein: 6.8 g/dL (ref 6.0–8.5)
eGFR: 75 mL/min/{1.73_m2} (ref >59)

## 2023-12-20 LAB — URIC ACID: Uric Acid: 4.6 mg/dL (ref 3.0–7.2)

## 2023-12-23 ENCOUNTER — Ambulatory Visit (INDEPENDENT_AMBULATORY_CARE_PROVIDER_SITE_OTHER)

## 2023-12-23 VITALS — Ht 67.0 in | Wt 190.0 lb

## 2023-12-23 DIAGNOSIS — Z0000 Encounter for general adult medical examination without abnormal findings: Secondary | ICD-10-CM | POA: Diagnosis not present

## 2023-12-23 NOTE — Patient Instructions (Signed)
 Jenna Wong , Thank you for taking time out of your busy schedule to complete your Annual Wellness Visit with me. I enjoyed our conversation and look forward to speaking with you again next year. I, as well as your care team,  appreciate your ongoing commitment to your health goals. Please review the following plan we discussed and let me know if I can assist you in the future. Your Game plan/ To Do List    Referrals: If you haven't heard from the office you've been referred to, please reach out to them at the phone provided.  Advised patient to contact Dr. Renaye Sous to evaluate gastrointestinal issues. Follow up Visits: Next Medicare AWV with our clinical staff: 12/24/2024 at 8:30 a.m.  phone visit with Nurse Health Advisor Have you seen your provider in the last 6 months (3 months if uncontrolled diabetes)? Yes Next Office Visit with your provider: Patient waiting on results of lab work  Clinician Recommendations:  Aim for 30 minutes of exercise or brisk walking, 6-8 glasses of water, and 5 servings of fruits and vegetables each day.       This is a list of the screening recommended for you and due dates:  Health Maintenance  Topic Date Due   DTaP/Tdap/Td vaccine (2 - Tdap) 02/23/2013   Complete foot exam   12/31/2019   COVID-19 Vaccine (5 - 2024-25 season) 02/24/2023   Hemoglobin A1C  10/23/2023   Yearly kidney health urinalysis for diabetes  12/10/2023   Screening for Lung Cancer  01/02/2024   Flu Shot  01/24/2024   Eye exam for diabetics  07/03/2024   Mammogram  10/08/2024   Yearly kidney function blood test for diabetes  12/18/2024   Medicare Annual Wellness Visit  12/22/2024   Colon Cancer Screening  03/05/2028   Pneumococcal Vaccine for age over 71  Completed   DEXA scan (bone density measurement)  Completed   Hepatitis C Screening  Completed   Zoster (Shingles) Vaccine  Completed   Hepatitis B Vaccine  Aged Out   HPV Vaccine  Aged Out   Meningitis B Vaccine  Aged Out     Advanced directives: (Declined) Advance directive discussed with you today. Even though you declined this today, please call our office should you change your mind, and we can give you the proper paperwork for you to fill out. Advance Care Planning is important because it:  [x]  Makes sure you receive the medical care that is consistent with your values, goals, and preferences  [x]  It provides guidance to your family and loved ones and reduces their decisional burden about whether or not they are making the right decisions based on your wishes.  Follow the link provided in your after visit summary or read over the paperwork we have mailed to you to help you started getting your Advance Directives in place. If you need assistance in completing these, please reach out to us  so that we can help you!  See attachments for Preventive Care and Fall Prevention Tips.

## 2023-12-23 NOTE — Progress Notes (Signed)
 Because this visit was a virtual/telehealth visit,  certain criteria was not obtained, such a blood pressure, CBG if applicable, and timed get up and go. Any medications not marked as taking were not mentioned during the medication reconciliation part of the visit. Any vitals not documented were not able to be obtained due to this being a telehealth visit or patient was unable to self-report a recent blood pressure reading due to a lack of equipment at home via telehealth. Vitals that have been documented are verbally provided by the patient.   Subjective:   Jenna Wong is a 73 y.o. who presents for a Medicare Wellness preventive visit.  As a reminder, Annual Wellness Visits don't include a physical exam, and some assessments may be limited, especially if this visit is performed virtually. We may recommend an in-person follow-up visit with your provider if needed.  Visit Complete: Virtual I connected with  Jenna Wong on 12/23/23 by a audio enabled telemedicine application and verified that I am speaking with the correct person using two identifiers.  Patient Location: Other:  car  Provider Location: Office/Clinic  I discussed the limitations of evaluation and management by telemedicine. The patient expressed understanding and agreed to proceed.  Vital Signs: Because this visit was a virtual/telehealth visit, some criteria may be missing or patient reported. Any vitals not documented were not able to be obtained and vitals that have been documented are patient reported.  VideoDeclined- This patient declined Librarian, academic. Therefore the visit was completed with audio only.  Persons Participating in Visit: Sister and patient was present during visit.  AWV Questionnaire: No: Patient Medicare AWV questionnaire was not completed prior to this visit.  Cardiac Risk Factors include: advanced age (>26men, >78 women);sedentary  lifestyle;dyslipidemia;hypertension;diabetes mellitus;smoking/ tobacco exposure;family history of premature cardiovascular disease     Objective:    Today's Vitals   12/23/23 1602  Weight: 190 lb (86.2 kg)  Height: 5' 7 (1.702 m)  PainSc: 10-Worst pain ever  PainLoc: Knee   Body mass index is 29.76 kg/m.     12/23/2023    4:07 PM 06/28/2023   11:32 AM 06/16/2023    1:47 AM 06/16/2023    1:46 AM 05/06/2023   12:21 AM 03/06/2023   11:13 AM 02/06/2023    2:05 PM  Advanced Directives  Does Patient Have a Medical Advance Directive? No No No No No No No  Would patient like information on creating a medical advance directive? No - Patient declined No - Patient declined   No - Patient declined No - Patient declined No - Patient declined    Current Medications (verified) Outpatient Encounter Medications as of 12/23/2023  Medication Sig   aspirin  81 MG tablet Take 81 mg by mouth daily.   atorvastatin  (LIPITOR) 40 MG tablet Take 1 tablet by mouth once daily   Dulaglutide  (TRULICITY ) 1.5 MG/0.5ML SOAJ Inject 1.5 mg into the skin once a week.   fluticasone  (FLONASE ) 50 MCG/ACT nasal spray Place 1 spray into both nostrils daily. 1 spray in each nostril every day   gabapentin  (NEURONTIN ) 100 MG capsule TAKE 1 CAPSULE BY MOUTH THREE TIMES DAILY   Lancet Devices (ONE TOUCH DELICA LANCING DEV) MISC 1 applicator by Does not apply route every morning. (Patient not taking: Reported on 10/29/2023)   metFORMIN  (GLUCOPHAGE ) 1000 MG tablet TAKE 1 TABLET BY MOUTH TWICE DAILY WITH A MEAL   naproxen  (NAPROSYN ) 500 MG tablet Take 1 tablet (500 mg total) by mouth  2 (two) times daily with a meal.   omeprazole  (PRILOSEC) 40 MG capsule Take 1 capsule (40 mg total) by mouth daily.   sertraline  (ZOLOFT ) 100 MG tablet Take 2 tablets (200 mg total) by mouth daily.   sucralfate  (CARAFATE ) 1 GM/10ML suspension Take 10 mLs (1 g total) by mouth 4 (four) times daily -  with meals and at bedtime.   vitamin B-12  (CYANOCOBALAMIN) 1000 MCG tablet Take 1,000 mcg by mouth daily.   No facility-administered encounter medications on file as of 12/23/2023.    Allergies (verified) Patient has no known allergies.   History: Past Medical History:  Diagnosis Date   Abnormal mammogram 12/00   repeat normal   Abscess breast 7/80   Anxiety    BACK PAIN, LOW 08/22/2006   Patient has been on long-term muscle relaxer and Vicodin. States she uses 3-4 times per week. On the bad days. Also does his home physical therapy. Has had rehabilitation for this chronic back pain. We'll continue with current care plan as prescribed by previous PCP.  05/2008 note from Devere Harries: Seen by neurosurgery in Sept 09, MRI of LS spine with lumbar stenosis, neck disc herniation but no c   Bilateral hand numbness 04/21/2018   Breast abscess of female 07/18/2011   Cellulitis 10/03/2020   Cervical disc displacement    diagnosed by MRI--sees dr Lindalee, evaluation by disability determination services for permanent disability 04/25/00   Constipation 05/28/2020   Chronic, per patient    Depressed mood 04/10/2021   Depression    Diabetes mellitus without complication (HCC)    Dizziness    Dysphagia 10/23/2019   Gait disturbance 05/28/2020   INSOMNIA, PERSISTENT 01/13/2007   Qualifier: Diagnosis of  By: Harries NP, Devere     Irritable bowel syndrome (IBS) 10/10/2011   Mental and behavioral problems with learning    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   Spinal stenosis    MRI LS spine 9/09   SPINAL STENOSIS, LUMBAR 06/22/2008   SPINAL STENOSIS, LUMBAR 06/22/2008   Qualifier: Diagnosis of  By: Harries NP, Susan     Trapezius muscle spasm 02/18/2020   Past Surgical History:  Procedure Laterality Date   ABDOMINAL HYSTERECTOMY     BREAST BIOPSY Left 09/11/2022   US  LT BREAST BX W LOC DEV 1ST LESION IMG BX SPEC US  GUIDE 09/11/2022 [facility] MAMMOGRAP[facility]   BREAST CYST ASPIRATION Left    MYOMECTOMY     THYROID  LOBECTOMY N/A 12/14/2019   Procedure: LEFT THYROID  LOBECTOMY;   Surgeon: Eletha Boas, MD;  Location: [facility];  Service: General;  Laterality: N/A;   Family History  Problem Relation Age of Onset   Parkinsonism Brother    Dementia Brother    Hypertension Mother    Hypertension Father    Depression Sister    Diabetes Sister    Stroke Sister    Breast cancer Sister    Social History   Socioeconomic History   Marital status: Single    Spouse name: Not on file   Number of children: 0   Years of education: 12   Highest education level: Not on file  Occupational History   Occupation: Forensic psychologist: OTHER    Comment: short term disability for LBP 11/2008 for lumbar stenosis and unable to perform physically demanding job   Occupation: Retired  Tobacco Use   Smoking status: Every Day    Current packs/day: 1.00    Average packs/day: 1 pack/day for 30.0 years (30.0  ttl pk-yrs)    Types: Cigarettes   Smokeless tobacco: Never   Tobacco comments:    thinking about it  Vaping Use   Vaping status: Never Used  Substance and Sexual Activity   Alcohol use: Yes    Comment: 1 x per month   Drug use: No   Sexual activity: Not Currently    Birth control/protection: Other-see comments  Other Topics Concern   Not on file  Social History Narrative   Patient lives with her sister in [place].    Patient has a dog named honey.    Patient walks honey ~5 days per week for exercise.    Patient is active in her church.    Patient enjoys gardening, watching ID tv, reading her bible, and weekly bible studies.             Social Drivers of Corporate investment banker Strain: Low Risk  (12/23/2023)   Overall Financial Resource Strain (CARDIA)    Difficulty of Paying Living Expenses: Not hard at all  Food Insecurity: No Food Insecurity (12/23/2023)   Hunger Vital Sign    Worried About Running Out of Food in the Last Year: Never true    Ran Out of Food in the Last Year: Never true  Transportation Needs: No Transportation Needs  (12/23/2023)   PRAPARE - Administrator, Civil Service (Medical): No    Lack of Transportation (Non-Medical): No  Physical Activity: Insufficiently Active (12/23/2023)   Exercise Vital Sign    Days of Exercise per Week: 7 days    Minutes of Exercise per Session: 20 min  Stress: No Stress Concern Present (12/23/2023)   [employer] of Occupational Health - Occupational Stress Questionnaire    Feeling of Stress: Not at all  Social Connections: Moderately Integrated (12/23/2023)   Social Connection and Isolation Panel    Frequency of Communication with Friends and Family: More than three times a week    Frequency of Social Gatherings with Friends and Family: More than three times a week    Attends Religious Services: More than 4 times per year    Active Member of [employer] or Organizations: Yes    Attends Banker Meetings: Never    Marital Status: Never married    Tobacco Counseling Ready to quit: Not Answered Counseling given: Not Answered Tobacco comments: thinking about it    Clinical Intake:  Pre-visit preparation completed: Yes  Pain : 0-10 Pain Score: 10-Worst pain ever Pain Type: Chronic pain Pain Location: Knee Pain Orientation: Right Pain Descriptors / Indicators: Aching, Constant, Discomfort, Throbbing Pain Onset: More than a month ago Pain Frequency: Constant     BMI - recorded: 29.76 Nutritional Status: BMI 25 -29 Overweight Nutritional Risks: None Diabetes: Yes CBG done?: No Did pt. bring in CBG monitor from home?: No  Lab Results  Component Value Date   HGBA1C 6.3 04/24/2023   HGBA1C 6.6 12/10/2022   HGBA1C 7.0 06/28/2022     How often do you need to have someone help you when you read instructions, pamphlets, or other written materials from your doctor or pharmacy?: 1 - Never  Interpreter Needed?: No  Information entered by :: Roz Fuller, LPN.   Activities of Daily Living     12/23/2023    4:07 PM  In your  present state of health, do you have any difficulty performing the following activities:  Hearing? 0  Vision? 0  Difficulty concentrating or making decisions? 0  Comment  BSE: PHONE GAMES  Walking or climbing stairs? 1  Dressing or bathing? 0  Doing errands, shopping? 0  Preparing Food and eating ? N  Using the Toilet? N  In the past six months, have you accidently leaked urine? N  Do you have problems with loss of bowel control? N  Managing your Medications? N  Managing your Finances? N  Housekeeping or managing your Housekeeping? N    Patient Care Team: Nicholas Bar, MD as PCP - General (Family Medicine) Kristie Lamprey, MD (Gastroenterology) [facility] as Consulting Physician (Ophthalmology)  I have updated your Care Teams any recent Medical Services you may have received from other providers in the past year.     Assessment:   This is a routine wellness examination for [place].  Hearing/Vision screen Hearing Screening - Comments:: Adequate hearing. Vision Screening - Comments:: Wears rx glasses with bifocal - up to date with routine eye exams with [facility]    Goals Addressed             This Visit's Progress    My goal is to get my knee better because I want to start back walking without using a cane.       Quit Smoking         Depression Screen     12/23/2023    4:34 PM 12/19/2023    3:45 PM 06/28/2023   11:31 AM 04/24/2023   10:53 AM 03/06/2023   11:12 AM 02/06/2023    2:03 PM 12/10/2022    1:33 PM  PHQ 2/9 Scores  PHQ - 2 Score 3 3 4 4 4 5 5   PHQ- 9 Score 11 11 12 13 12 15 12     Fall Risk     12/23/2023    4:07 PM 12/19/2023    3:42 PM 06/28/2023   11:32 AM 03/06/2023   11:12 AM 12/10/2022    1:33 PM  Fall Risk   Falls in the past year? 1 0 0 0 0  Number falls in past yr: 0  0 0 0  Injury with Fall? 1  0 0 0  Comment HIP      Follow up Falls evaluation completed;Education provided        MEDICARE RISK AT HOME:  Medicare Risk  at Home Any stairs in or around the home?: Yes (5 STEPS FRONT PORCH, LAUNDRY ROOM IN THE BASEMENT) If so, are there any without handrails?: No Home free of loose throw rugs in walkways, pet beds, electrical cords, etc?: Yes Adequate lighting in your home to reduce risk of falls?: Yes Life alert?: No Use of a cane, walker or w/c?: Yes Grab bars in the bathroom?: No Shower chair or bench in shower?: Yes Elevated toilet seat or a handicapped toilet?: Yes  TIMED UP AND GO:  Was the test performed?  No  Cognitive Function: Declined/Normal: No cognitive concerns noted by patient or family. Patient alert, oriented, able to answer questions appropriately and recall recent events. No signs of memory loss or confusion.    04/14/2012    2:00 PM  MMSE - Mini Mental State Exam  Orientation to time 5   Orientation to Place 5   Registration 3   Attention/ Calculation 1   Recall 2   Language- name 2 objects 2   Language- repeat 1  Language- follow 3 step command 3   Language- read & follow direction 1   Write a sentence 0   Copy design  1   Total score 24      Data saved with a previous flowsheet row definition        12/23/2023    4:06 PM 12/07/2022    3:16 PM 12/08/2020    3:38 PM  6CIT Screen  What Year? 0 points 0 points 0 points  What month? 0 points 0 points 0 points  What time? 0 points 0 points 0 points  Count back from 20 0 points 0 points 0 points  Months in reverse 0 points 0 points 0 points  Repeat phrase 0 points 0 points 0 points  Total Score 0 points 0 points 0 points    Immunizations Immunization History  Administered Date(s) Administered   Fluad Quad(high Dose 65+) 06/27/2020, 06/21/2021   Fluad Trivalent(High Dose 65+) 03/06/2023   Influenza Split 04/10/2011, 03/13/2012   Influenza Whole 07/04/2009, 04/04/2010   Influenza, High Dose Seasonal PF 04/12/2019   Influenza,inj,Quad PF,6+ Mos 03/11/2013, 06/06/2015, 08/03/2016, 05/03/2017, 04/21/2018    Influenza-Unspecified 01/31/2022   PFIZER(Purple Top)SARS-COV-2 Vaccination 08/07/2019, 09/01/2019, 04/28/2020   PNEUMOCOCCAL CONJUGATE-20 01/09/2021   PPD Test 04/10/2011, 04/10/2013   Pfizer Covid-19 Vaccine Bivalent Booster 70yrs & up 06/21/2021   Pneumococcal Polysaccharide-23 04/12/2019   Td 02/24/2003   Zoster Recombinant(Shingrix) 04/12/2019   Zoster, Unspecified 01/31/2022    Screening Tests Health Maintenance  Topic Date Due   DTaP/Tdap/Td (2 - Tdap) 02/23/2013   FOOT EXAM  12/31/2019   COVID-19 Vaccine (5 - 2024-25 season) 02/24/2023   HEMOGLOBIN A1C  10/23/2023   Diabetic kidney evaluation - Urine ACR  12/10/2023   Lung Cancer Screening  01/02/2024   INFLUENZA VACCINE  01/24/2024   OPHTHALMOLOGY EXAM  07/03/2024   MAMMOGRAM  10/08/2024   Diabetic kidney evaluation - eGFR measurement  12/18/2024   Medicare Annual Wellness (AWV)  12/22/2024   Colonoscopy  03/05/2028   Pneumococcal Vaccine: 50+ Years  Completed   DEXA SCAN  Completed   Hepatitis C Screening  Completed   Zoster Vaccines- Shingrix  Completed   Hepatitis B Vaccines  Aged Out   HPV VACCINES  Aged Out   Meningococcal B Vaccine  Aged Out    Health Maintenance  Health Maintenance Due  Topic Date Due   DTaP/Tdap/Td (2 - Tdap) 02/23/2013   FOOT EXAM  12/31/2019   COVID-19 Vaccine (5 - 2024-25 season) 02/24/2023   HEMOGLOBIN A1C  10/23/2023   Diabetic kidney evaluation - Urine ACR  12/10/2023   Lung Cancer Screening  01/02/2024   Health Maintenance Items Addressed: Yes Patient is overdue for the following care gaps: Dtap, Covid, Diabetic Foot Exam, Diabetic Kidney Evaluation, HgA1C and Lung Cancer Screening.   Additional Screening:  Vision Screening: Recommended annual ophthalmology exams for early detection of glaucoma and other disorders of the eye. Would you like a referral to an eye doctor? No    Dental Screening: Recommended annual dental exams for proper oral hygiene  Community Resource  Referral / Chronic Care Management: CRR required this visit?  No   CCM required this visit?  No   Plan:    I have personally reviewed and noted the following in the patient's chart:   Medical and social history Use of alcohol, tobacco or illicit drugs  Current medications and supplements including opioid prescriptions. Patient is not currently taking opioid prescriptions. Functional ability and status Nutritional status Physical activity Advanced directives List of other physicians Hospitalizations, surgeries, and ER visits in previous 12 months Vitals Screenings to include cognitive, depression, and falls  Referrals and appointments  In addition, I have reviewed and discussed with patient certain preventive protocols, quality metrics, and best practice recommendations. A written personalized care plan for preventive services as well as general preventive health recommendations were provided to patient.   Roz LOISE Fuller, LPN   [id number]/[id number]   After Visit Summary: (MyChart) Due to this being a telephonic visit, the after visit summary with patients personalized plan was offered to patient via MyChart   Notes: Patient is overdue for the following care gaps: Dtap, Covid, Diabetic Foot Exam, Diabetic Kidney Evaluation, HgA1C and Lung Cancer Screening. This nurse will request record from [facility].

## 2023-12-24 ENCOUNTER — Telehealth: Payer: Self-pay | Admitting: Family Medicine

## 2023-12-24 NOTE — Telephone Encounter (Signed)
 Please schedule visit with PCP to address HCM items.  Suzann Daring, MD  Family Medicine Teaching Service

## 2024-01-04 ENCOUNTER — Other Ambulatory Visit: Payer: Self-pay | Admitting: Family Medicine

## 2024-01-06 ENCOUNTER — Ambulatory Visit (INDEPENDENT_AMBULATORY_CARE_PROVIDER_SITE_OTHER): Admitting: Family Medicine

## 2024-01-06 ENCOUNTER — Encounter: Payer: Self-pay | Admitting: Family Medicine

## 2024-01-06 VITALS — BP 123/82 | HR 88 | Wt 198.8 lb

## 2024-01-06 DIAGNOSIS — E538 Deficiency of other specified B group vitamins: Secondary | ICD-10-CM

## 2024-01-06 DIAGNOSIS — E7849 Other hyperlipidemia: Secondary | ICD-10-CM | POA: Diagnosis not present

## 2024-01-06 DIAGNOSIS — E119 Type 2 diabetes mellitus without complications: Secondary | ICD-10-CM | POA: Diagnosis not present

## 2024-01-06 DIAGNOSIS — K219 Gastro-esophageal reflux disease without esophagitis: Secondary | ICD-10-CM

## 2024-01-06 DIAGNOSIS — Z13820 Encounter for screening for osteoporosis: Secondary | ICD-10-CM

## 2024-01-06 DIAGNOSIS — Z0000 Encounter for general adult medical examination without abnormal findings: Secondary | ICD-10-CM | POA: Diagnosis not present

## 2024-01-06 DIAGNOSIS — Z720 Tobacco use: Secondary | ICD-10-CM | POA: Diagnosis not present

## 2024-01-06 DIAGNOSIS — Z23 Encounter for immunization: Secondary | ICD-10-CM

## 2024-01-06 DIAGNOSIS — I10 Essential (primary) hypertension: Secondary | ICD-10-CM

## 2024-01-06 DIAGNOSIS — N6342 Unspecified lump in left breast, subareolar: Secondary | ICD-10-CM

## 2024-01-06 LAB — POCT GLYCOSYLATED HEMOGLOBIN (HGB A1C): HbA1c, POC (controlled diabetic range): 6.2 % (ref 0.0–7.0)

## 2024-01-06 MED ORDER — OMEPRAZOLE 20 MG PO CPDR: 20.0000 mg | DELAYED_RELEASE_CAPSULE | Freq: Every day | ORAL | 0 refills | Status: AC

## 2024-01-06 NOTE — Assessment & Plan Note (Signed)
 History of dyslipidemia on atorvastatin   - Lipid panel  - Continue atorvastatin 

## 2024-01-06 NOTE — Assessment & Plan Note (Signed)
 Stable. Patient has maintained about 3 cigarettes a day. Is still determined to quit.  - Follow up visit with Dr. Koval

## 2024-01-06 NOTE — Assessment & Plan Note (Signed)
 Well controlled BP  - Not on any medications.  Continue exercises, encouraged water exercises given knee arthritis.

## 2024-01-06 NOTE — Patient Instructions (Addendum)
 It was wonderful to see you today.  Please bring ALL of your medications with you to every visit.   Today we talked about:  We ordered a lung cancer screening CT, they will approve it through your insurance and then call to schedule it.   We are also scheduling your bone scan.   Please continue taking your medications. You can stop the aspirin .   Make a follow up to talk about tobacco use with Dr. Koval   Thank you for choosing [facility].   Please call [phone] with any questions about today's appointment.  Areta Saliva, MD  Family Medicine

## 2024-01-06 NOTE — Assessment & Plan Note (Signed)
 A1c stable and well controlled.  - Continue trulicity , metformin 

## 2024-01-06 NOTE — Progress Notes (Signed)
 Subjective:  CC -- Annual Physical   General Healthcare: Medication Compliance: yes  Dx Hypertension: yes  Dx Hyperlipidemia: yes  Diabetes: yes A1c 6.2 controlled  Dx Obesity: yes class 1 Weight Loss: no stable  Physical Activity: yes    Social: Driving: yes  Alcohol Use: no  Tobacco Use: yes, 3 cigarettes a day   - Interested in Quitting: yes, working with Dr. Koval.  Other Drugs: no  Independence: yes  Depression: stable mood    - PHQ9 score: 9   Cancer:  Colorectal >> Colonoscopy: no, next due in 2029 Lung >> Tobacco Use: yes, due for Lung cancer screening CT.  Breast >> Mammogram due in 08/2024. S/p bx of left subareolar mass, benign with negative margins.  Cervical/Endometrial >>  - Postmenopausal: yes - Hysterectomy: yes  - Vaginal Bleeding: no - Pap Smear: no, hysterectomy   Other: Osteoporosis: no, scheduled DEXA scan  Zoster Vaccine: yes Flu Vaccine: no, not season  Pneumonia Vaccine: completed  Past Medical History Patient Active Problem List   Diagnosis Date Noted   Urticaria 12/19/2023   Internal hemorrhoids 03/01/2023   Sessile colonic polyp 03/01/2023   Grief counseling 11/28/2022   Breast mass 06/28/2022   Enlarged thyroid  12/14/2019   Multiple thyroid  nodules 12/13/2019   Thyroid  nodule 10/29/2019   Postmenopausal estrogen deficiency 09/18/2019   Diabetes (HCC) 10/19/2016   Dizziness 03/27/2016   Essential hypertension 12/21/2015   Right knee pain 07/14/2012   Tobacco use 12/18/2010   Depression with anxiety 04/27/2009   Hyperlipidemia 12/13/2008   OBESITY, NOS 08/22/2006    Medications- reviewed and updated Current Outpatient Medications  Medication Sig Dispense Refill   aspirin  81 MG tablet Take 81 mg by mouth daily.     atorvastatin  (LIPITOR) 40 MG tablet Take 1 tablet by mouth once daily 90 tablet 1   Dulaglutide  (TRULICITY ) 1.5 MG/0.5ML SOAJ Inject 1.5 mg into the skin once a week. 2 mL 4   fluticasone  (FLONASE ) 50 MCG/ACT  nasal spray Place 1 spray into both nostrils daily. 1 spray in each nostril every day 16 g 12   gabapentin  (NEURONTIN ) 100 MG capsule TAKE 1 CAPSULE BY MOUTH THREE TIMES DAILY 90 capsule 0   Lancet Devices (ONE TOUCH DELICA LANCING DEV) MISC 1 applicator by Does not apply route every morning. 1 each 0   metFORMIN  (GLUCOPHAGE ) 1000 MG tablet TAKE 1 TABLET BY MOUTH TWICE DAILY WITH A MEAL 90 tablet 0   naproxen  (NAPROSYN ) 500 MG tablet Take 1 tablet (500 mg total) by mouth 2 (two) times daily with a meal. 60 tablet 1   sertraline  (ZOLOFT ) 100 MG tablet Take 2 tablets (200 mg total) by mouth daily. 60 tablet 3   sucralfate  (CARAFATE ) 1 GM/10ML suspension Take 10 mLs (1 g total) by mouth 4 (four) times daily -  with meals and at bedtime. 420 mL 0   vitamin B-12 (CYANOCOBALAMIN) 1000 MCG tablet Take 1,000 mcg by mouth daily.     omeprazole  (PRILOSEC) 20 MG capsule Take 1 capsule (20 mg total) by mouth daily. 30 capsule 0   No current facility-administered medications for this visit.    Objective: BP 123/82   Pulse 88   Wt 198 lb 12.8 oz (90.2 kg)   SpO2 100%   BMI 31.14 kg/m  Gen: NAD, alert, cooperative with exam HEENT: NCAT, EOMI CV: RRR, good S1/S2, no murmur Resp: CTABL, no wheezes, non-labored Abd: Soft, Non Tender, Non Distended Ext: No edema, warm Neuro: Alert and oriented,  No gross deficits   Assessment/Plan:  Assessment & Plan Tobacco use Stable. Patient has maintained about 3 cigarettes a day. Is still determined to quit.  - Follow up visit with Dr. Koval  Type 2 diabetes mellitus without complication, without long-term current use of insulin  (HCC) A1c stable and well controlled.  - Continue trulicity , metformin   Other hyperlipidemia History of dyslipidemia on atorvastatin   - Lipid panel  - Continue atorvastatin   Essential hypertension Well controlled BP  - Not on any medications.  Continue exercises, encouraged water exercises given knee arthritis.  B12  deficiency Patient on daily B12. Has PPI use and sucralfate  use. Eats meat.  - Follow up B12 and adjust supplementation accordingly     Areta Saliva 01/06/24

## 2024-01-07 ENCOUNTER — Ambulatory Visit: Payer: Self-pay | Admitting: Family Medicine

## 2024-01-07 ENCOUNTER — Other Ambulatory Visit: Payer: Self-pay

## 2024-01-07 ENCOUNTER — Other Ambulatory Visit: Payer: Self-pay | Admitting: Family Medicine

## 2024-01-07 LAB — LIPID PANEL
Chol/HDL Ratio: 2.7 ratio (ref 0.0–4.4)
Cholesterol, Total: 131 mg/dL (ref 100–199)
HDL: 48 mg/dL (ref >39)
LDL Chol Calc (NIH): 66 mg/dL (ref 0–99)
Triglycerides: 92 mg/dL (ref 0–149)
VLDL Cholesterol Cal: 17 mg/dL (ref 5–40)

## 2024-01-07 LAB — BASIC METABOLIC PANEL WITH GFR
BUN/Creatinine Ratio: 13 (ref 12–28)
BUN: 11 mg/dL (ref 8–27)
CO2: 23 mmol/L (ref 20–29)
Calcium: 10.6 mg/dL — ABNORMAL HIGH (ref 8.7–10.3)
Chloride: 101 mmol/L (ref 96–106)
Creatinine, Ser: 0.85 mg/dL (ref 0.57–1.00)
Glucose: 111 mg/dL — ABNORMAL HIGH (ref 70–99)
Potassium: 4.9 mmol/L (ref 3.5–5.2)
Sodium: 140 mmol/L (ref 134–144)
eGFR: 74 mL/min/1.73 (ref >59)

## 2024-01-07 LAB — MICROALBUMIN / CREATININE URINE RATIO
Creatinine, Urine: 64 mg/dL
Microalb/Creat Ratio: <5 mg/g{creat} (ref 0–29)
Microalbumin, Urine: <3 ug/mL

## 2024-01-07 LAB — VITAMIN B12: Vitamin B-12: 1583 pg/mL — ABNORMAL HIGH (ref 232–1245)

## 2024-01-07 NOTE — Telephone Encounter (Signed)
 Discussed lab results with patient.  Informed her that B12 was elevated and that she should stop supplementation.  Also informed her that her Ca was elevated. I scheduled her a lab appointment for tomorrow to recheck Ca.

## 2024-01-09 ENCOUNTER — Other Ambulatory Visit

## 2024-01-10 ENCOUNTER — Ambulatory Visit: Payer: Self-pay | Admitting: Family Medicine

## 2024-01-10 LAB — COMPREHENSIVE METABOLIC PANEL WITH GFR
ALT: 19 IU/L (ref 0–32)
AST: 22 IU/L (ref 0–40)
Albumin: 4.4 g/dL (ref 3.9–4.9)
Alkaline Phosphatase: 69 IU/L (ref 44–121)
BUN/Creatinine Ratio: 13 (ref 12–28)
BUN: 11 mg/dL (ref 8–27)
Bilirubin Total: 0.5 mg/dL (ref 0.0–1.2)
CO2: 25 mmol/L (ref 20–29)
Calcium: 9.9 mg/dL (ref 8.7–10.3)
Chloride: 100 mmol/L (ref 96–106)
Creatinine, Ser: 0.83 mg/dL (ref 0.57–1.00)
Globulin, Total: 2.8 g/dL (ref 1.5–4.5)
Glucose: 90 mg/dL (ref 70–99)
Potassium: 4.7 mmol/L (ref 3.5–5.2)
Sodium: 140 mmol/L (ref 134–144)
Total Protein: 7.2 g/dL (ref 6.0–8.5)
eGFR: 76 mL/min/1.73 (ref >59)

## 2024-01-23 ENCOUNTER — Ambulatory Visit (HOSPITAL_COMMUNITY)
Admission: RE | Admit: 2024-01-23 | Discharge: 2024-01-23 | Disposition: A | Discharge status: Home / Self Care | Source: Ambulatory Visit | Attending: Family Medicine | Admitting: Family Medicine

## 2024-01-23 DIAGNOSIS — Z122 Encounter for screening for malignant neoplasm of respiratory organs: Secondary | ICD-10-CM | POA: Insufficient documentation

## 2024-01-23 DIAGNOSIS — F17210 Nicotine dependence, cigarettes, uncomplicated: Secondary | ICD-10-CM | POA: Insufficient documentation

## 2024-01-23 DIAGNOSIS — J439 Emphysema, unspecified: Secondary | ICD-10-CM | POA: Diagnosis not present

## 2024-01-23 DIAGNOSIS — I3139 Other pericardial effusion (noninflammatory): Secondary | ICD-10-CM | POA: Insufficient documentation

## 2024-01-23 DIAGNOSIS — Z720 Tobacco use: Secondary | ICD-10-CM

## 2024-02-01 ENCOUNTER — Other Ambulatory Visit: Payer: Self-pay | Admitting: Family Medicine

## 2024-02-01 DIAGNOSIS — F418 Other specified anxiety disorders: Secondary | ICD-10-CM

## 2024-02-20 ENCOUNTER — Other Ambulatory Visit: Payer: Self-pay | Admitting: Student

## 2024-02-20 ENCOUNTER — Other Ambulatory Visit: Payer: Self-pay | Admitting: Family Medicine

## 2024-02-20 ENCOUNTER — Ambulatory Visit: Admitting: Family Medicine

## 2024-02-20 DIAGNOSIS — E119 Type 2 diabetes mellitus without complications: Secondary | ICD-10-CM

## 2024-02-21 ENCOUNTER — Ambulatory Visit (INDEPENDENT_AMBULATORY_CARE_PROVIDER_SITE_OTHER): Admitting: Family Medicine

## 2024-02-21 ENCOUNTER — Other Ambulatory Visit: Payer: Self-pay | Admitting: Family Medicine

## 2024-02-21 VITALS — BP 133/82 | HR 99 | Ht 67.0 in | Wt 198.4 lb

## 2024-02-21 DIAGNOSIS — N61 Mastitis without abscess: Secondary | ICD-10-CM

## 2024-02-21 MED ORDER — CEFADROXIL 500 MG PO CAPS: 500.0000 mg | ORAL_CAPSULE | Freq: Two times a day (BID) | ORAL | 0 refills | Status: AC

## 2024-02-21 MED ORDER — DOXYCYCLINE HYCLATE 100 MG PO TABS: 100.0000 mg | ORAL_TABLET | Freq: Two times a day (BID) | ORAL | 0 refills | Status: AC

## 2024-02-21 NOTE — Assessment & Plan Note (Signed)
 Exam concerning for recurrence of left breast infection despite lumpectomy in 06/2023.  Follows with [facility], advised to call today and schedule follow-up with the surgeon.  Ordered diagnostic mammogram/ultrasound and will keep on broad antibiotic course until seen by the specialist. -Diagnostic mammogram and ultrasound -Cefadroxil  500 mg twice daily and doxycycline  100 mg twice daily x 14-day course -Follow-up in 2 weeks if unable to see the surgeon before that time

## 2024-02-21 NOTE — Progress Notes (Signed)
    SUBJECTIVE:   CHIEF COMPLAINT / HPI:   L breast pain Ongoing the past week.  Area of redness and warmth that she feels is getting bigger and more painful. Not draining but feels like it needs to. Denies nipple drainage. Had lumpectomy in 06/2023 with [facility] due to chronic left breast infection.  Thinks she may be having fever and feeling bad overall.   Reports she is still smoking but has been trying to cut back.  PERTINENT  PMH / PSH: Chronic left breast infection s/p lumpectomy, HTN, T2DM, HLD, tobacco use  OBJECTIVE:   BP 133/82   Pulse 99   Ht 5' 7 (1.702 m)   Wt 198 lb 6.4 oz (90 kg)   SpO2 100%   BMI 31.07 kg/m    General: NAD, pleasant, able to participate in exam Breast: Left breast with erythema and warmth in the 5 to 6 o'clock position just under the nipple.  No drainage or nipple discharge noted.  Upon palpation some induration of tissue, no fluctuance noted.  See media tab for image.  ASSESSMENT/PLAN:   Assessment & Plan Breast infection Exam concerning for recurrence of left breast infection despite lumpectomy in 06/2023.  Follows with [facility], advised to call today and schedule follow-up with the surgeon.  Ordered diagnostic mammogram/ultrasound and will keep on broad antibiotic course until seen by the specialist. -Diagnostic mammogram and ultrasound -Cefadroxil  500 mg twice daily and doxycycline  100 mg twice daily x 14-day course -Follow-up in 2 weeks if unable to see the surgeon before that time   Dr. Izetta Nap, DO [facility]

## 2024-02-21 NOTE — Patient Instructions (Signed)
 It was wonderful to see you today! Thank you for choosing [facility].   Please bring ALL of your medications with you to every visit.   Today we talked about:  I ordered a diagnostic mammogram and they will do an ultrasound of your left breast to be done at the breast center.  Our office will schedule that for you. I would like you to take 2 different antibiotics for the next 2 weeks until you are able to be seen by the surgeon ideally.  Please take the cefadroxil  and the doxycycline  twice per day. Please call the breast surgeon for follow up as they need to manage your care and determine if additional surgery is needed.  Dr. [facility] [address] [place], [place] [address] General Surgery [phone] (Work)  Please follow up in 2 weeks if not seen by the breast surgeon by that time  Call the clinic at [phone] if your symptoms worsen or you have any concerns.  Please be sure to schedule follow up at the front desk before you leave today.   Izetta Nap, DO Family Medicine

## 2024-02-25 ENCOUNTER — Ambulatory Visit
Admission: RE | Admit: 2024-02-25 | Discharge: 2024-02-25 | Disposition: A | Discharge status: Home / Self Care | Source: Ambulatory Visit | Attending: Family Medicine | Admitting: Family Medicine

## 2024-02-25 ENCOUNTER — Other Ambulatory Visit: Payer: Self-pay | Admitting: Family Medicine

## 2024-02-25 ENCOUNTER — Ambulatory Visit: Payer: Self-pay | Admitting: Family Medicine

## 2024-02-25 DIAGNOSIS — N61 Mastitis without abscess: Secondary | ICD-10-CM

## 2024-03-05 ENCOUNTER — Other Ambulatory Visit: Payer: Self-pay | Admitting: Family Medicine

## 2024-03-05 DIAGNOSIS — F418 Other specified anxiety disorders: Secondary | ICD-10-CM

## 2024-03-14 ENCOUNTER — Emergency Department (HOSPITAL_COMMUNITY)
Admission: EM | Admit: 2024-03-14 | Discharge: 2024-03-14 | Disposition: A | Discharge status: Home / Self Care | Attending: Emergency Medicine | Admitting: Emergency Medicine

## 2024-03-14 DIAGNOSIS — W57XXXA Bitten or stung by nonvenomous insect and other nonvenomous arthropods, initial encounter: Secondary | ICD-10-CM | POA: Insufficient documentation

## 2024-03-14 DIAGNOSIS — S70362A Insect bite (nonvenomous), left thigh, initial encounter: Secondary | ICD-10-CM | POA: Insufficient documentation

## 2024-03-14 MED ORDER — HYDROCORTISONE 1 % EX CREA: TOPICAL_CREAM | CUTANEOUS | 0 refills | Status: AC

## 2024-03-14 MED ORDER — MUPIROCIN CALCIUM 2 % EX CREA: 1.0000 | TOPICAL_CREAM | Freq: Two times a day (BID) | CUTANEOUS | 0 refills | Status: AC

## 2024-03-14 NOTE — Discharge Instructions (Addendum)
 Today you were seen for insect bites to your right thigh.  Please pick up your hydrocortisone  for itching and mupirocin  ointment for possible infection.  Please follow-up with your PCP if your symptoms persist.  Please return to the ED if you have puslike discharge, fever, or worsening pain.  Thank you for letting us  treat you today. After performing a physical exam, I feel you are safe to go home. Please follow up with your PCP in the next several days and provide them with your records from this visit. Return to the Emergency Room if pain becomes severe or symptoms worsen.

## 2024-03-14 NOTE — ED Provider Notes (Signed)
 [place] EMERGENCY DEPARTMENT AT [facility] Provider Note   CSN: [id number] Arrival date & time: 03/14/24  1427     Patient presents with: Insect Bite   Jenna Wong is a 73 y.o. female presents today for insect bites on her left thigh on the front porch yesterday patient denies pain, fever, chills, nausea, vomiting, any other complaints at this time.  Patient does endorse pruritus, mild swelling and erythema.   HPI     Prior to Admission medications   Medication Sig Start Date End Date Taking? Authorizing Provider  hydrocortisone  cream 1 % Apply to affected area 2 times daily 03/14/24  Yes Jena Tegeler N, PA-C  mupirocin  cream (BACTROBAN ) 2 % Apply 1 Application topically 2 (two) times daily. 03/14/24  Yes Idalys Konecny N, PA-C  atorvastatin  (LIPITOR) 40 MG tablet Take 1 tablet by mouth once daily 02/21/24   Nicholas Bar, MD  fluticasone  (FLONASE ) 50 MCG/ACT nasal spray Place 1 spray into both nostrils daily. 1 spray in each nostril every day 08/10/21   Patel, Poonamkumari J, MD  gabapentin  (NEURONTIN ) 100 MG capsule TAKE 1 CAPSULE BY MOUTH THREE TIMES DAILY 03/26/22   Nicholas Bar, MD  Lancet Devices (ONE TOUCH DELICA LANCING DEV) MISC 1 applicator by Does not apply route every morning. 11/28/22   Nicholas Bar, MD  metFORMIN  (GLUCOPHAGE ) 1000 MG tablet TAKE 1 TABLET BY MOUTH TWICE DAILY WITH A MEAL 02/21/24   Nicholas Bar, MD  naproxen  (NAPROSYN ) 500 MG tablet Take 1 tablet (500 mg total) by mouth 2 (two) times daily with a meal. 02/06/23   Chambliss, Layman CROME, MD  omeprazole  (PRILOSEC) 20 MG capsule Take 1 capsule (20 mg total) by mouth daily. 01/06/24 02/05/24  Nicholas Bar, MD  sertraline  (ZOLOFT ) 100 MG tablet Take 2 tablets by mouth once daily 03/06/24   Romelle Booty, MD  sucralfate  (CARAFATE ) 1 GM/10ML suspension Take 10 mLs (1 g total) by mouth 4 (four) times daily -  with meals and at bedtime. 10/01/23   McDiarmid, Krystal BIRCH, MD  TRULICITY  1.5 MG/0.5ML SOAJ  INJECT 1.5 MG SUBCUTANEOUSLY ONCE A WEEK 02/21/24   Nicholas Bar, MD    Allergies: Patient has no known allergies.    Review of Systems  Skin:  Positive for wound.    Updated Vital Signs BP 115/73 (BP Location: Left Arm)   Pulse 95   Temp 98.2 F (36.8 C) (Oral)   Resp 16   SpO2 98%   Physical Exam Vitals and nursing note reviewed.  Constitutional:      General: She is not in acute distress.    Appearance: She is well-developed.  HENT:     Head: Normocephalic and atraumatic.     Right Ear: External ear normal.     Left Ear: External ear normal.     Nose: Nose normal.  Eyes:     Conjunctiva/sclera: Conjunctivae normal.  Cardiovascular:     Rate and Rhythm: Normal rate and regular rhythm.     Pulses: Normal pulses.     Heart sounds: Normal heart sounds. No murmur heard. Pulmonary:     Effort: Pulmonary effort is normal. No respiratory distress.     Breath sounds: Normal breath sounds. No wheezing.  Abdominal:     Palpations: Abdomen is soft.     Tenderness: There is no abdominal tenderness.  Musculoskeletal:        General: No swelling.     Cervical back: Neck supple.  Skin:    General:  Skin is warm and dry.     Capillary Refill: Capillary refill takes less than 2 seconds.     Findings: Erythema present.     Comments: Multiple small wheals noted to patient's left lateral thigh with immediately surrounding erythema.  Excoriations noted on exam.  No purulent discharge noted on exam or other signs of acute infection.  Neurological:     General: No focal deficit present.     Mental Status: She is alert and oriented to person, place, and time.  Psychiatric:        Mood and Affect: Mood normal.     (all labs ordered are listed, but only abnormal results are displayed) Labs Reviewed - No data to display  EKG: None  Radiology: No results found.   Procedures   Medications Ordered in the ED - No data to display                                  Medical  Decision Making  This patient presents to the ED for concern of insect bite differential diagnosis includes insect bite, cellulitis, abscess, necrotizing fasciitis   Problem List / ED Course:  Considered for admission or further workup however patient's vital signs and physical exam are reassuring.  Patient's symptoms likely due to insect bite.  No acute infection noted on exam.  Patient given topical hydrocortisone  for pruritus and Bactroban  for any possible superficial infection.  Patient given return precautions.  I feel patient safe for discharge at this time.        Final diagnoses:  Insect bite, unspecified site, initial encounter    ED Discharge Orders          Ordered    mupirocin  cream (BACTROBAN ) 2 %  2 times daily        03/14/24 1857    hydrocortisone  cream 1 %        03/14/24 1857               Francis Ileana SAILOR, PA-C 03/14/24 1900    Laurice Maude BROCKS, MD 03/14/24 2214

## 2024-03-14 NOTE — ED Triage Notes (Signed)
 Pt arrived via POV for insect bites or left thigh, proximal to knee.  Pt reports feeling something biting her leg while on the front porch yesterday.    Denies pain.  Pt a&o x 4

## 2024-03-21 ENCOUNTER — Encounter (HOSPITAL_BASED_OUTPATIENT_CLINIC_OR_DEPARTMENT_OTHER): Payer: Self-pay | Admitting: Emergency Medicine

## 2024-03-21 ENCOUNTER — Emergency Department (HOSPITAL_BASED_OUTPATIENT_CLINIC_OR_DEPARTMENT_OTHER)
Admission: EM | Admit: 2024-03-21 | Discharge: 2024-03-21 | Disposition: A | Discharge status: Home / Self Care | Attending: Emergency Medicine | Admitting: Emergency Medicine

## 2024-03-21 ENCOUNTER — Other Ambulatory Visit: Payer: Self-pay

## 2024-03-21 DIAGNOSIS — N611 Abscess of the breast and nipple: Secondary | ICD-10-CM | POA: Diagnosis not present

## 2024-03-21 DIAGNOSIS — Z794 Long term (current) use of insulin: Secondary | ICD-10-CM | POA: Diagnosis not present

## 2024-03-21 DIAGNOSIS — N61 Mastitis without abscess: Secondary | ICD-10-CM

## 2024-03-21 DIAGNOSIS — N644 Mastodynia: Secondary | ICD-10-CM | POA: Diagnosis present

## 2024-03-21 MED ORDER — OXYCODONE HCL 5 MG PO TABS: 2.5000 mg | ORAL_TABLET | ORAL | 0 refills | Status: AC | PRN

## 2024-03-21 MED ORDER — AMOXICILLIN-POT CLAVULANATE 875-125 MG PO TABS: 1.0000 | ORAL_TABLET | Freq: Two times a day (BID) | ORAL | 0 refills | Status: AC

## 2024-03-21 MED ORDER — SULFAMETHOXAZOLE-TRIMETHOPRIM 800-160 MG PO TABS: 1.0000 | ORAL_TABLET | Freq: Two times a day (BID) | ORAL | 0 refills | Status: AC

## 2024-03-21 NOTE — ED Triage Notes (Signed)
 Pt reports pain and swelling to left breast, has been diagnosed previously with breast infection and was told she does not have breast cancer. Breast is warm to touch and tender to palpation.

## 2024-03-21 NOTE — ED Notes (Signed)
 Pt given discharge instructions and reviewed prescriptions. Opportunities given for questions. Pt verbalizes understanding. Jillyn Hidden, RN

## 2024-03-21 NOTE — ED Provider Notes (Signed)
 [place] EMERGENCY DEPARTMENT AT [facility] Provider Note   CSN: [id number] Arrival date & time: 03/21/24  1548     Patient presents with: Breast Pain   Jenna Wong is a 73 y.o. female.   73 year old female with a history of breast abscess and infection status post lumpectomy who presents to the emergency department with left breast pain.  Reports that for the past month has been having pain in her left breast.  Did have an ultrasound on 02/25/2024 as well as a mammography that was consistent with mastitis without a focal abscess.  Started on doxycycline  and cefadroxil  for 14 days.  Completed this and felt somewhat improved and then she started having pain and swelling again around her left areola.  No fevers.  Has not yet seen a breast surgeon       Prior to Admission medications   Medication Sig Start Date End Date Taking? Authorizing Provider  amoxicillin-clavulanate (AUGMENTIN) 875-125 MG tablet Take 1 tablet by mouth every 12 (twelve) hours for 10 days. 03/21/24 03/31/24 Yes Yolande Lamar BROCKS, MD  oxyCODONE  (ROXICODONE ) 5 MG immediate release tablet Take 0.5 tablets (2.5 mg total) by mouth every 4 (four) hours as needed for severe pain (pain score 7-10). 03/21/24  Yes Yolande Lamar BROCKS, MD  sulfamethoxazole -trimethoprim  (BACTRIM  DS) 800-160 MG tablet Take 1 tablet by mouth 2 (two) times daily for 10 days. 03/21/24 03/31/24 Yes Yolande Lamar BROCKS, MD  atorvastatin  (LIPITOR) 40 MG tablet Take 1 tablet by mouth once daily 02/21/24   Nicholas Bar, MD  fluticasone  (FLONASE ) 50 MCG/ACT nasal spray Place 1 spray into both nostrils daily. 1 spray in each nostril every day 08/10/21   Patel, Poonamkumari J, MD  gabapentin  (NEURONTIN ) 100 MG capsule TAKE 1 CAPSULE BY MOUTH THREE TIMES DAILY 03/26/22   Nicholas Bar, MD  hydrocortisone  cream 1 % Apply to affected area 2 times daily 03/14/24   Keith, Kayla N, PA-C  Lancet Devices (ONE TOUCH DELICA LANCING DEV) MISC 1 applicator by  Does not apply route every morning. 11/28/22   Nicholas Bar, MD  metFORMIN  (GLUCOPHAGE ) 1000 MG tablet TAKE 1 TABLET BY MOUTH TWICE DAILY WITH A MEAL 02/21/24   Nicholas Bar, MD  mupirocin  cream (BACTROBAN ) 2 % Apply 1 Application topically 2 (two) times daily. 03/14/24   Keith, Kayla N, PA-C  naproxen  (NAPROSYN ) 500 MG tablet Take 1 tablet (500 mg total) by mouth 2 (two) times daily with a meal. 02/06/23   Chambliss, Layman CROME, MD  omeprazole  (PRILOSEC) 20 MG capsule Take 1 capsule (20 mg total) by mouth daily. 01/06/24 02/05/24  Nicholas Bar, MD  sertraline  (ZOLOFT ) 100 MG tablet Take 2 tablets by mouth once daily 03/06/24   Romelle Booty, MD  sucralfate  (CARAFATE ) 1 GM/10ML suspension Take 10 mLs (1 g total) by mouth 4 (four) times daily -  with meals and at bedtime. 10/01/23   McDiarmid, Krystal BIRCH, MD  TRULICITY  1.5 MG/0.5ML SOAJ INJECT 1.5 MG SUBCUTANEOUSLY ONCE A WEEK 02/21/24   Nicholas Bar, MD    Allergies: Patient has no known allergies.    Review of Systems  Updated Vital Signs BP (!) 119/92   Pulse 78   Temp (!) 97.5 F (36.4 C) (Temporal)   Resp 16   Ht 5' 7 (1.702 m)   Wt 89.8 kg   SpO2 99%   BMI 31.01 kg/m   Physical Exam Exam conducted with a chaperone present (Chaperoned by [employer]).  Constitutional:  General: She is not in acute distress.    Appearance: Normal appearance. She is not ill-appearing.  Chest:    Neurological:     Mental Status: She is alert.     (all labs ordered are listed, but only abnormal results are displayed) Labs Reviewed - No data to display  EKG: None  Radiology: No results found.   Procedures   Medications Ordered in the ED - No data to display                                  Medical Decision Making Amount and/or Complexity of Data Reviewed Radiology: ordered.  Risk Prescription drug management.   73 year old female with history of breast infection/abscess status post lumpectomy presents emergency department  breast pain and swelling  Initial Ddx:  Mastitis, abscess, malignancy, sepsis  MDM/Course:  Patient presents emergency department with swelling and pain of her left breast.  Not septic.  Does have an area that is fluctuant which I suspect may be an underlying abscess.  Had an ultrasound earlier in the month that did not show that this was present.  Will resume antibiotics and have her follow-up with breast surgery especially since she is required a lumpectomy in the past.  Referral for repeat breast ultrasound and incision and drainage placed as well  This patient presents to the ED for concern of complaints listed in HPI, this involves an extensive number of treatment options, and is a complaint that carries with it a high risk of complications and morbidity. Disposition including potential need for admission considered.   Dispo: DC Home. Return precautions discussed including, but not limited to, those listed in the AVS. Allowed pt time to ask questions which were answered fully prior to dc.  Records reviewed Outpatient Clinic Notes I have reviewed the patients home medications and made adjustments as needed Social Determinants of health:  Geriatric  Portions of this note were generated with Scientist, clinical (histocompatibility and immunogenetics). Dictation errors may occur despite best attempts at proofreading.     Final diagnoses:  Mastitis  Breast abscess    ED Discharge Orders          Ordered    amoxicillin-clavulanate (AUGMENTIN) 875-125 MG tablet  Every 12 hours        03/21/24 1754    sulfamethoxazole -trimethoprim  (BACTRIM  DS) 800-160 MG tablet  2 times daily        03/21/24 1754    US  BREAST ASPIRATION RIGHT        03/21/24 1756    oxyCODONE  (ROXICODONE ) 5 MG immediate release tablet  Every 4 hours PRN        03/21/24 1756               Yolande Lamar BROCKS, MD 03/21/24 [phone]

## 2024-03-21 NOTE — Discharge Instructions (Addendum)
 You were seen for your breast infection in the emergency department.   At home, please take the antibiotics (bactrim  and augmentin) we prescribed you.    Check your MyChart online for the results of any tests that had not resulted by the time you left the emergency department.   Please follow-up with Dr. Ebbie in the next 2 to 3 days about your infection.  The breast center will call you about a repeat ultrasound and drainage.    Return immediately to the emergency department if you experience any of the following: Fevers, or any other concerning symptoms.    Thank you for visiting our Emergency Department. It was a pleasure taking care of you today.

## 2024-03-30 ENCOUNTER — Other Ambulatory Visit: Payer: Self-pay | Admitting: Family Medicine

## 2024-03-30 DIAGNOSIS — E119 Type 2 diabetes mellitus without complications: Secondary | ICD-10-CM

## 2024-04-03 ENCOUNTER — Other Ambulatory Visit: Payer: Self-pay | Admitting: Family Medicine

## 2024-04-03 DIAGNOSIS — F418 Other specified anxiety disorders: Secondary | ICD-10-CM

## 2024-04-06 ENCOUNTER — Other Ambulatory Visit: Payer: Self-pay | Admitting: Family Medicine

## 2024-04-06 DIAGNOSIS — E119 Type 2 diabetes mellitus without complications: Secondary | ICD-10-CM

## 2024-04-17 ENCOUNTER — Other Ambulatory Visit: Payer: Self-pay | Admitting: Family Medicine

## 2024-04-17 DIAGNOSIS — R42 Dizziness and giddiness: Secondary | ICD-10-CM

## 2024-04-23 MED ORDER — MECLIZINE HCL 25 MG PO TABS
25.0000 mg | ORAL_TABLET | Freq: Three times a day (TID) | ORAL | 0 refills | Status: AC | PRN
Start: 2024-04-23 — End: ?

## 2024-04-23 NOTE — Telephone Encounter (Signed)
 Patient returns call to nurse line regarding meclizine  refill.   I advised that refill was denied as patient needs appt.   Patient is asking the reason for needing an appt. Patient had annual exam on 01/06/24.   We went ahead and scheduled patient for next week.   Patient is requesting refill to last until this appt.   Please advise.   Chiquita JAYSON English, RN

## 2024-04-23 NOTE — Addendum Note (Signed)
 Addended by: Calden Dorsey on: 04/23/2024 01:22 PM   Modules accepted: Orders

## 2024-04-28 ENCOUNTER — Ambulatory Visit: Admitting: Family Medicine

## 2024-04-28 VITALS — BP 124/79 | HR 86 | Wt 197.2 lb

## 2024-04-28 DIAGNOSIS — N6320 Unspecified lump in the left breast, unspecified quadrant: Secondary | ICD-10-CM

## 2024-04-28 DIAGNOSIS — Z23 Encounter for immunization: Secondary | ICD-10-CM

## 2024-04-28 NOTE — Patient Instructions (Signed)
 Thank you for coming in today! Here is a summary of what we discussed:  Debby Curtistine Shipper, MD (Attending) [phone] (Work) [phone] (Fax) [address] [place], [place] [address] General Surgery  Please call the clinic at [phone] if your symptoms worsen or you have any concerns.  Best, Dr Adele

## 2024-04-28 NOTE — Assessment & Plan Note (Signed)
 Ongoing L breast mass with purulent drainage. No signs of infection today, patient overall well appearing. Pt was instructed to follow up with general surgery 3 weeks after last appt on 10/10, provided their contact information for pt to schedule appointment. Pt can contact me if having trouble contacting GS clinic, I can try to get in touch with their office. Reviewed return precautions.

## 2024-04-28 NOTE — Progress Notes (Signed)
    SUBJECTIVE:   CHIEF COMPLAINT / HPI:   Breast tenderness --L breast mastitis end of Sept, treated with Augmentin and Bactrim  --saw surgeon 10/10 for f/u, had lumpectomy in Jan 25 (benign tissue, had abscess and foreign body giant cell reaction). No acute infection but prescribed long course doxy (30 days) to prevent recurrence. No fluid to drain at that time. Was advised to follow up in 3 weeks. Pt states she didn't know their office contact info to schedule f/u appt --has been taking doxy daily --Felt warm about 1 week ago, had lots of pus from L nipple. Unsure if febrile --Some pain, better after pus drainage --no drainage currently  PERTINENT  PMH / PSH: HTN, DM, HLD, tobacco use  OBJECTIVE:   BP 124/79   Pulse 86   Wt 197 lb 3.2 oz (89.4 kg)   SpO2 100%   BMI 30.89 kg/m   General: Awake and conversant, no acute distress Pulm: normal work of breathing on room air Breast (chaperone MS, CMA present): L breast with 3-4 cm subareolar mass at base of areola. 1cm pustule at 5:00 position of L areola. No drainage or fluctuance. No other lesions palpated on L breast Neuro: No focal deficits Psych: Appropriate mood and affect   ASSESSMENT/PLAN:   Assessment & Plan Mass of left breast, unspecified quadrant Ongoing L breast mass with purulent drainage. No signs of infection today, patient overall well appearing. Pt was instructed to follow up with general surgery 3 weeks after last appt on 10/10, provided their contact information for pt to schedule appointment. Pt can contact me if having trouble contacting GS clinic, I can try to get in touch with their office. Reviewed return precautions.   Pt c/o generalized itchiness at the end of visit today, has tried hydrocortisone  cream with minimal relief. Advised f/u with PCP to discuss further.  Rea Raring, MD [facility]

## 2024-05-06 ENCOUNTER — Other Ambulatory Visit: Payer: Self-pay | Admitting: Family Medicine

## 2024-05-06 DIAGNOSIS — F418 Other specified anxiety disorders: Secondary | ICD-10-CM

## 2024-05-07 NOTE — Telephone Encounter (Signed)
 Called pt regarding dose of zoloft  to see if she is stable on it. She says she feels stable but has been stressed because she still has drainage from her breast despite finishing antibiotics. She ssays she is going to try to call gen surg office again because she was not able to get an appt last time she called. I asked her to let us  know if she has trouble making the appt.  Have sent in the refill.

## 2024-05-22 ENCOUNTER — Other Ambulatory Visit: Payer: Self-pay | Admitting: Family Medicine

## 2024-05-22 DIAGNOSIS — E119 Type 2 diabetes mellitus without complications: Secondary | ICD-10-CM

## 2024-06-09 ENCOUNTER — Other Ambulatory Visit: Payer: Self-pay | Admitting: Family Medicine

## 2024-06-09 DIAGNOSIS — F418 Other specified anxiety disorders: Secondary | ICD-10-CM

## 2024-06-23 ENCOUNTER — Other Ambulatory Visit: Payer: Self-pay | Admitting: Family Medicine

## 2024-06-23 DIAGNOSIS — E119 Type 2 diabetes mellitus without complications: Secondary | ICD-10-CM

## 2024-06-24 ENCOUNTER — Encounter: Payer: Self-pay | Admitting: Student

## 2024-06-24 ENCOUNTER — Ambulatory Visit: Payer: Self-pay | Admitting: Student

## 2024-06-24 ENCOUNTER — Ambulatory Visit: Admitting: Student

## 2024-06-24 VITALS — BP 134/84 | HR 81 | Ht 67.0 in | Wt 195.8 lb

## 2024-06-24 DIAGNOSIS — R1013 Epigastric pain: Secondary | ICD-10-CM | POA: Diagnosis not present

## 2024-06-24 DIAGNOSIS — E119 Type 2 diabetes mellitus without complications: Secondary | ICD-10-CM

## 2024-06-24 DIAGNOSIS — R42 Dizziness and giddiness: Secondary | ICD-10-CM | POA: Diagnosis not present

## 2024-06-24 LAB — POCT GLYCOSYLATED HEMOGLOBIN (HGB A1C): HbA1c, POC (controlled diabetic range): 6.5 % (ref 0.0–7.0)

## 2024-06-24 MED ORDER — TRULICITY 1.5 MG/0.5ML ~~LOC~~ SOAJ: 1.5000 mg | SUBCUTANEOUS | 0 refills | Status: AC

## 2024-06-24 NOTE — Assessment & Plan Note (Signed)
 A1c today 6.5 Suspect abdominal pain is related to her diabetic medications  Shared decision making with the patient to either stop the metformin  or the Trulicity  - she would like to stop metformin .  With her A1c being well controlled I believe this will still be okay for her diabetic control  Discussed that if her symptoms do not improve after stopping the metformin  we will need to decrease her GLP1  Also discussed following post-cholecystectomy diet to help with the chronic diarrhea  Will check CMP and lipase today to rule out liver or pancrease etiology

## 2024-06-24 NOTE — Progress Notes (Signed)
" ° ° °  SUBJECTIVE:   CHIEF COMPLAINT / HPI:   Jenna Wong is a 73 y.o. female presenting for abdominal pain.   Upper abdominal pain and gastrointestinal symptoms - Burning sensation in the upper abdomen for over three months - Pain worsens with eating - Vomiting and diarrhea occur immediately after meals - Carafate  has not improved symptoms - History of cholecystectomy in 2007 - Diarrhea occurs after fatty foods  Glycemic control and diabetes management - Diabetes treated with Trulicity  for approximately one year and Metformin  for a longer duration - Improved glycemic control with last A1c of 6.2 in July  Gastroesophageal reflux symptoms - History of acid reflux diagnosed years ago - Prior severe episode of reflux described as feeling like a heart attack - No significant reflux symptoms when lying down  Gait instability and dizziness - Daily dizziness and unsteady, 'drunk-type' gait for approximately seven months - Symptoms are consistent throughout the week - Family member concerned about risk of falls - no falls reported currently  - patient denies these symptoms and is unconcerned   PERTINENT  PMH / PSH: reviewed and updated.  OBJECTIVE:   BP 134/84   Pulse 81   Ht 5' 7 (1.702 m)   Wt 195 lb 12.8 oz (88.8 kg)   SpO2 100%   BMI 30.67 kg/m   Well-appearing, no acute distress Cardio: Regular rate, regular rhythm, no murmurs on exam. Pulm: Clear, no wheezing, no crackles. No increased work of breathing Abdominal: bowel sounds present, soft, non-tender, non-distended Extremities: no peripheral edema  Neuro: alert and oriented x3, speech normal in content, no facial asymmetry, strength intact and equal bilaterally in UE and LE, pupils equal and reactive to light.  Psych:  Cognition and judgment appear intact. Alert, communicative  and cooperative with normal attention span and concentration. No apparent delusions, illusions, hallucinations    ASSESSMENT/PLAN:    Assessment & Plan Epigastric pain Type 2 diabetes mellitus without complication, without long-term current use of insulin  (HCC) A1c today 6.5 Suspect abdominal pain is related to her diabetic medications  Shared decision making with the patient to either stop the metformin  or the Trulicity  - she would like to stop metformin .  With her A1c being well controlled I believe this will still be okay for her diabetic control  Discussed that if her symptoms do not improve after stopping the metformin  we will need to decrease her GLP1  Also discussed following post-cholecystectomy diet to help with the chronic diarrhea  Will check CMP and lipase today to rule out liver or pancrease etiology  Dizziness Brought up at the end of the visit.  Patient is unconcerned but sister is worried. Patient appears to have capacity to make medical decisions. Discussed ED precautions for falls or changes in gait  Will check HIV, RPR, B12, CBC today and follow up in 1 week     Damien Pinal, DO [facility]  "

## 2024-06-24 NOTE — Assessment & Plan Note (Signed)
 Brought up at the end of the visit.  Patient is unconcerned but sister is worried. Patient appears to have capacity to make medical decisions. Discussed ED precautions for falls or changes in gait  Will check HIV, RPR, B12, CBC today and follow up in 1 week

## 2024-06-24 NOTE — Patient Instructions (Signed)
 STOP taking the metformin   Follow up in 2 weeks   Gallbladder Problems: Eating Plan Having high blood cholesterol, obesity, an inactive lifestyle, an unhealthy diet, or diabetes can put you at risk for getting gallstones. If you have a gallbladder problem, you may have trouble digesting fats. You may also have symptoms when you eat a lot of fat. Eating a low-fat diet can help lessen your symptoms. It may be helpful before and after having surgery to have your gallbladder taken out. Your health care provider may recommend that you work with an expert in healthy eating called a dietitian. They can help you lower the amount of fat in your diet. What are tips for following this plan? General guidelines Limit how much fat you have to less than 30% of your total daily calories. If you eat around 1,800 calories each day, you'll be eating less than 60 grams (g) of fat a day. Fat is an important part of a healthy diet. Eating a low-fat diet can make it hard to keep a healthy body weight. Ask your dietitian how much fat, calories, and other nutrients you need each day. Eat small meals often throughout the day instead of 3 large meals. Drink at least 8-10 cups (1.9-2.4 L) of fluid a day. If you drink alcohol: Limit how much you have to: 0-1 drink a day if you're female. 0-2 drinks a day if you're female. Know how much alcohol is in your drink. In the [place] one drink is one 12 oz bottle of beer (355 mL), one 5 oz glass of wine (148 mL), or one 1 oz glass of hard liquor (44 mL). Reading food labels  Check nutrition facts on food labels for how much fat is in a serving. Choose foods with less than 3 grams of fat per serving. Shopping Choose nonfat and low-fat healthy foods. Look for the words nonfat, low-fat, or fat-free. Avoid buying processed or prepackaged foods. Cooking Cook using low-fat methods, such as baking, broiling, grilling, or boiling. Cook with small amounts of healthy fats, such as  olive oil, grapeseed oil, canola oil, avocado oil, or sunflower oil. What foods are recommended?  All fresh, frozen, or canned fruits and vegetables. Whole grains. Low-fat or nonfat (skim) milk and yogurt. Lean meat, skinless poultry, fish, eggs, and beans. Low-fat protein supplement powders or drinks. Spices and herbs. The items listed above may not be all the foods and drinks you can have. Talk with a dietitian to learn more. What foods are not recommended? High-fat foods. These include baked goods, fast food, fatty cuts of meat, ice cream, french toast, sweet rolls, pizza, cheese bread, foods covered with butter, creamy sauces, or cheese. Fried foods. These include french fries, tempura, battered fish, breaded chicken, fried breads, and sweets. Foods that cause bloating and gas. The items listed above may not be all the foods and drinks you should avoid. Talk with a dietitian to learn more. This information is not intended to replace advice given to you by your health care provider. Make sure you discuss any questions you have with your health care provider. Document Revised: 09/17/2023 Document Reviewed: 09/17/2023 Elsevier Patient Education  2025 [employer].

## 2024-06-25 LAB — COMPREHENSIVE METABOLIC PANEL WITH GFR
ALT: 18 IU/L (ref 0–32)
AST: 25 IU/L (ref 0–40)
Albumin: 4.3 g/dL (ref 3.8–4.8)
Alkaline Phosphatase: 69 IU/L (ref 49–135)
BUN/Creatinine Ratio: 11 — ABNORMAL LOW (ref 12–28)
BUN: 10 mg/dL (ref 8–27)
Bilirubin Total: 0.6 mg/dL (ref 0.0–1.2)
CO2: 23 mmol/L (ref 20–29)
Calcium: 9.3 mg/dL (ref 8.7–10.3)
Chloride: 103 mmol/L (ref 96–106)
Creatinine, Ser: 0.88 mg/dL (ref 0.57–1.00)
Globulin, Total: 2.9 g/dL (ref 1.5–4.5)
Glucose: 122 mg/dL — ABNORMAL HIGH (ref 70–99)
Potassium: 3.9 mmol/L (ref 3.5–5.2)
Sodium: 141 mmol/L (ref 134–144)
Total Protein: 7.2 g/dL (ref 6.0–8.5)
eGFR: 70 mL/min/1.73

## 2024-06-25 LAB — IRON,TIBC AND FERRITIN PANEL
Ferritin: 136 ng/mL (ref 15–150)
Iron Saturation: 25 % (ref 15–55)
Iron: 77 ug/dL (ref 27–139)
Total Iron Binding Capacity: 310 ug/dL (ref 250–450)
UIBC: 233 ug/dL (ref 118–369)

## 2024-06-25 LAB — CBC
Hematocrit: 42.9 % (ref 34.0–46.6)
Hemoglobin: 14.1 g/dL (ref 11.1–15.9)
MCH: 30.7 pg (ref 26.6–33.0)
MCHC: 32.9 g/dL (ref 31.5–35.7)
MCV: 94 fL (ref 79–97)
Platelets: 263 x10E3/uL (ref 150–450)
RBC: 4.59 x10E6/uL (ref 3.77–5.28)
RDW: 13.2 % (ref 11.7–15.4)
WBC: 7.7 x10E3/uL (ref 3.4–10.8)

## 2024-06-25 LAB — SYPHILIS: RPR W/REFLEX TO RPR TITER AND TREPONEMAL ANTIBODIES, TRADITIONAL SCREENING AND DIAGNOSIS ALGORITHM: RPR Ser Ql: NONREACTIVE

## 2024-06-25 LAB — HIV ANTIBODY (ROUTINE TESTING W REFLEX): HIV Screen 4th Generation wRfx: NONREACTIVE

## 2024-06-25 LAB — LIPASE: Lipase: 21 U/L (ref 14–85)

## 2024-06-25 LAB — VITAMIN B12: Vitamin B-12: 928 pg/mL (ref 232–1245)

## 2024-07-05 ENCOUNTER — Other Ambulatory Visit: Payer: Self-pay | Admitting: Family Medicine

## 2024-07-05 DIAGNOSIS — E119 Type 2 diabetes mellitus without complications: Secondary | ICD-10-CM

## 2024-07-07 ENCOUNTER — Encounter: Payer: Self-pay | Admitting: Student

## 2024-07-07 ENCOUNTER — Ambulatory Visit: Payer: Self-pay | Admitting: Student

## 2024-07-07 VITALS — BP 132/77 | HR 95 | Ht 67.0 in | Wt 194.0 lb

## 2024-07-07 DIAGNOSIS — Z720 Tobacco use: Secondary | ICD-10-CM | POA: Diagnosis not present

## 2024-07-07 DIAGNOSIS — N61 Mastitis without abscess: Secondary | ICD-10-CM

## 2024-07-07 DIAGNOSIS — R1084 Generalized abdominal pain: Secondary | ICD-10-CM

## 2024-07-07 NOTE — Assessment & Plan Note (Signed)
 Referral to pharmacy placed  Patient reports having patches at home, only uses them occasionally

## 2024-07-07 NOTE — Assessment & Plan Note (Addendum)
 History of breast infection.  Follows with general surgery, last saw them 07/06/24 No notable signs of infection today, denies fever chills  Follow up as needed

## 2024-07-07 NOTE — Progress Notes (Signed)
" ° ° °  SUBJECTIVE:   CHIEF COMPLAINT / HPI:   Jenna Wong is a 74 y.o. female presenting for follow up.    She was last seen on June 24, 2024 for burning upper abdominal pain with gastrointestinal symptoms. Metformin  was stopped at that visit and her stomach symptoms have improved since discontinuation.  She had gait instability and dizziness that her sister noticed. Recent blood work including CMP, iron levels, CBC, and A1c was normal. She reports her dizziness has resolved after receiving medication for it. She has not had any recent falls.  Regarding balance, she previously tried physical therapy but stopped due to cost. She plans to continue the exercises at home. She denies numbness in her feet and had a recent eye exam for vision changes.  She had left breast surgery in January of last year. She reports intermittent nipple discharge and soreness, especially with swelling. She has two sisters with breast cancer. Her mammogram in September of last year was normal.  She is working on smoking cessation and is using lozenges and patches. She is worried about relapsing to smoking while using the patch.  PERTINENT  PMH / PSH: reviewed and updated.  OBJECTIVE:   BP 132/77   Pulse 95   Ht 5' 7 (1.702 m)   Wt 194 lb (88 kg)   SpO2 100%   BMI 30.38 kg/m   Well-appearing, no acute distress Cardio: Regular rate, regular rhythm, no murmurs on exam. Pulm: Clear, no wheezing, no crackles. No increased work of breathing Abdominal: bowel sounds present, soft, non-tender, non-distended Extremities: no peripheral edema   Breast: MA Chaperone present [employer]  Tenderness to palpation over the left nipple  No discharge from nipple  No erythema or fluctuance noted   ASSESSMENT/PLAN:   Assessment & Plan Tobacco use Referral to pharmacy placed  Patient reports having patches at home, only uses them occasionally  Generalized abdominal pain Improved after stopping metformin .   Continue Trulicity  for T2DM  Follow up as needed  Breast infection History of breast infection.  Follows with general surgery, last saw them 07/06/24 No notable signs of infection today, denies fever chills  Follow up as needed      Damien Pinal, DO [facility]  "

## 2024-07-07 NOTE — Patient Instructions (Signed)
 It was great to see you today!  I have scheduled an appointment with Dr. Koval to talk about cutting back on smoking.    Future Appointments  Date Time Provider Department Center  07/07/2024 11:10 AM Cleotilde Perkins, DO [facility]  07/14/2024  2:00 PM Amalia Maude MATSU, RPH-CPP [facility]  12/24/2024  8:30 AM [facility] ANNUAL WELLNESS VISIT [facility]    Please arrive 15 minutes before your appointment to ensure smooth check in process.  If you are more than 15 minutes late, you may be asked to reschedule.   Please bring a list of your medications with you to all appointments.   Please call the clinic at [phone] if your symptoms worsen or you have any concerns.  Thank you for allowing me to participate in your care, Dr. Perkins [facility]ot[facility]

## 2024-07-08 ENCOUNTER — Other Ambulatory Visit: Payer: Self-pay | Admitting: Family Medicine

## 2024-07-08 DIAGNOSIS — F418 Other specified anxiety disorders: Secondary | ICD-10-CM

## 2024-07-14 ENCOUNTER — Ambulatory Visit: Admitting: Pharmacist

## 2024-07-21 ENCOUNTER — Telehealth: Payer: Self-pay | Admitting: Pharmacist

## 2024-07-21 NOTE — Telephone Encounter (Signed)
 Reviewed and agree with Dr Rennis plan.

## 2024-07-21 NOTE — Telephone Encounter (Signed)
 Patient contacted for follow/up of tobacco intake reduction / cessation attempt and missed appointment 07/14/2024  Since last contact patient reports it is hard, reports using 4mg  nicotine  gum.  Did not provided a current amout of cigarettes smoked per day.   Continues to rates IMPORTANCE of quitting tobacco as high.   Most common triggers to use tobacco include; other smokers being around her smoking.   Rescheduled appointment for 2.12.26   Total time with patient call and documentation of interaction: 9 minutes.

## 2024-08-06 ENCOUNTER — Ambulatory Visit: Admitting: Pharmacist

## 2024-12-24 ENCOUNTER — Encounter
